# Patient Record
Sex: Female | Born: 1935
Health system: Southern US, Community
[De-identification: ages and names within clinical notes are randomized; demographics above are authoritative.]

## PROBLEM LIST (undated history)

## (undated) DIAGNOSIS — E039 Hypothyroidism, unspecified: Secondary | ICD-10-CM

## (undated) DIAGNOSIS — M5136 Other intervertebral disc degeneration, lumbar region: Secondary | ICD-10-CM

## (undated) DIAGNOSIS — I10 Essential (primary) hypertension: Secondary | ICD-10-CM

## (undated) DIAGNOSIS — K56609 Unspecified intestinal obstruction, unspecified as to partial versus complete obstruction: Secondary | ICD-10-CM

## (undated) DIAGNOSIS — E785 Hyperlipidemia, unspecified: Secondary | ICD-10-CM

## (undated) DIAGNOSIS — H4040X3 Glaucoma secondary to eye inflammation, unspecified eye, severe stage: Secondary | ICD-10-CM

## (undated) HISTORY — DX: Unspecified intestinal obstruction, unspecified as to partial versus complete obstruction: K56.609

## (undated) HISTORY — DX: Hyperlipidemia, unspecified: E78.5

---

## 1997-10-04 ENCOUNTER — Other Ambulatory Visit: Admission: RE | Admit: 1997-10-04 | Discharge: 1997-10-04 | Payer: Self-pay | Admitting: Gynecology

## 1998-10-19 ENCOUNTER — Other Ambulatory Visit: Admission: RE | Admit: 1998-10-19 | Discharge: 1998-10-19 | Payer: Self-pay | Admitting: Gynecology

## 1999-11-27 ENCOUNTER — Other Ambulatory Visit: Admission: RE | Admit: 1999-11-27 | Discharge: 1999-11-27 | Payer: Self-pay | Admitting: Gynecology

## 1999-12-30 ENCOUNTER — Other Ambulatory Visit: Admission: RE | Admit: 1999-12-30 | Discharge: 1999-12-30 | Payer: Self-pay | Admitting: Gynecology

## 2000-12-29 ENCOUNTER — Other Ambulatory Visit: Admission: RE | Admit: 2000-12-29 | Discharge: 2000-12-29 | Payer: Self-pay | Admitting: Gynecology

## 2002-01-11 ENCOUNTER — Other Ambulatory Visit: Admission: RE | Admit: 2002-01-11 | Discharge: 2002-01-11 | Payer: Self-pay | Admitting: Gynecology

## 2002-03-28 ENCOUNTER — Encounter: Payer: Self-pay | Admitting: Gynecology

## 2002-03-28 ENCOUNTER — Ambulatory Visit (HOSPITAL_COMMUNITY): Admission: RE | Admit: 2002-03-28 | Discharge: 2002-03-28 | Payer: Self-pay | Admitting: Gynecology

## 2002-08-26 ENCOUNTER — Emergency Department (HOSPITAL_COMMUNITY): Admission: EM | Admit: 2002-08-26 | Discharge: 2002-08-26 | Payer: Self-pay | Admitting: Emergency Medicine

## 2002-08-26 ENCOUNTER — Encounter: Payer: Self-pay | Admitting: Emergency Medicine

## 2002-11-21 ENCOUNTER — Encounter: Admission: RE | Admit: 2002-11-21 | Discharge: 2002-12-28 | Payer: Self-pay | Admitting: Internal Medicine

## 2003-02-16 ENCOUNTER — Other Ambulatory Visit: Admission: RE | Admit: 2003-02-16 | Discharge: 2003-02-16 | Payer: Self-pay | Admitting: Gynecology

## 2003-03-30 ENCOUNTER — Encounter: Admission: RE | Admit: 2003-03-30 | Discharge: 2003-06-28 | Payer: Self-pay | Admitting: Internal Medicine

## 2003-06-08 ENCOUNTER — Other Ambulatory Visit: Admission: RE | Admit: 2003-06-08 | Discharge: 2003-06-08 | Payer: Self-pay | Admitting: Gynecology

## 2004-03-28 ENCOUNTER — Other Ambulatory Visit: Admission: RE | Admit: 2004-03-28 | Discharge: 2004-03-28 | Payer: Self-pay | Admitting: Gynecology

## 2005-05-06 HISTORY — PX: US ECHOCARDIOGRAPHY: HXRAD669

## 2006-01-27 HISTORY — PX: EYE SURGERY: SHX253

## 2006-12-16 ENCOUNTER — Observation Stay (HOSPITAL_COMMUNITY): Admission: EM | Admit: 2006-12-16 | Discharge: 2006-12-17 | Payer: Self-pay | Admitting: Emergency Medicine

## 2007-01-13 ENCOUNTER — Encounter: Admission: RE | Admit: 2007-01-13 | Discharge: 2007-01-13 | Payer: Self-pay | Admitting: Family Medicine

## 2007-02-17 ENCOUNTER — Encounter: Admission: RE | Admit: 2007-02-17 | Discharge: 2007-04-06 | Payer: Self-pay | Admitting: Family Medicine

## 2009-03-12 ENCOUNTER — Encounter
Admission: RE | Admit: 2009-03-12 | Discharge: 2009-05-22 | Payer: Self-pay | Admitting: Physical Medicine and Rehabilitation

## 2010-06-11 NOTE — Discharge Summary (Signed)
Priscilla Tran, Priscilla Tran                ACCOUNT NO.:  [id number]   MEDICAL RECORD NO.:  [id number]          PATIENT TYPE:  INP   LOCATION:  1405                         FACILITY:  [facility]   PHYSICIAN:  Ladell Pier, M.D.   DATE OF BIRTH:  06-01-1935   DATE OF ADMISSION:  12/16/2006  DATE OF DISCHARGE:  12/17/2006                               DISCHARGE SUMMARY   DISCHARGE DIAGNOSIS:  1. Angioedema secondary to questionable etiology of shrimp versus ACE      inhibitor.  2. Diabetes.  3. Hypertension.  4. Glaucoma.   DISCHARGE MEDICATIONS:  1. Hydrochlorothiazide 25 mg daily.  2. Prednisone taper 10 mg daily x2 days, 2 daily x2 days, then 1 daily      for 2 days, then stop.  3. Claritin 10 mg daily.  4. Metformin ER 1000 mg daily.  5. Effexor 5 mg at bedtime.  6. Etodolac 40 mg twice daily.  7. Atropine eye drops as directed.  8. Flonase nasal spray as directed.  9. Levothyroxine 150 mcg one every other day.  10.Lovastatin 40 mg daily.   FOLLOWUP APPOINTMENTS:  The patient to follow up with primary care  physician in 1 week.   PROCEDURES:  None.   CONSULTANTS:  None.   HISTORY OF PRESENT ILLNESS:  The patient is a 75 year old African-  American female that came in with swelling on her lips, her face, and  her neck.  No difficulty swallowing.  She stated that she had shrimp  earlier that evening and about 3 hours after she noted that her mouth  started swelling.  She also has been taking ACE inhibitor for blood  pressure, but she has been taking that for quite some time.   PAST MEDICAL HISTORY, SOCIAL HISTORY, MEDICATIONS, ALLERGIES, REVIEW OF  SYSTEMS:  Per admission H&P.   PHYSICAL EXAMINATION ON DISCHARGE:  Temperature 97.9, pulse 81,  respirations 23, blood pressure 117/70, pulse oximetry 96% on room air.  CBG 195-209.  HEENT:  Head is normocephalic, atraumatic.  She had  changes on one pupil secondary to glaucoma.  Lungs were clear  bilaterally.  Abdomen:  Positive  bowel sounds.  No edema.  She is able  to talk, phonate, swallow without any problems.  No further swelling in  the neck or lips; that has improved.   HOSPITAL COURSE:  #1.  ANGIOEDEMA:  She was admitted to the hospital, placed on IV  steroid. ACE inhibitor was discontinued. By the next day, her swelling  had resolved.  She will be discharged on prednisone taper.  She will  take Claritin as well daily.   #2.  DIABETES:  Her blood sugar slightly elevated, but she is on the  prednisone taper, so that should improve over the next couple of days.   #3.  HYPERTENSION:  Blood pressure is well controlled without the  lisinopril. Discharge on 25 of hydrochlorothiazide until followup with  her primary care physician for further management.   DISCHARGE LABORATORY DATA:  Hemoglobin A1c 7.0.      Ladell Pier, M.D.  Electronically Signed     NJ/MEDQ  D:  12/17/2006  T:  12/17/2006  Job:  [id number]   cc:   Gabriel Earing, M.D.  Fax: [phone]

## 2010-06-11 NOTE — H&P (Signed)
NAMELAFERN, Priscilla Tran                ACCOUNT NO.:  [id number]   MEDICAL RECORD NO.:  [id number]          PATIENT TYPE:  INP   LOCATION:  1405                         FACILITY:  [facility]   PHYSICIAN:  Della Goo, M.D. DATE OF BIRTH:  01-16-1936   DATE OF ADMISSION:  12/16/2006  DATE OF DISCHARGE:                              HISTORY & PHYSICAL   PRIMARY CARE PHYSICIAN:  Unassigned.   CHIEF COMPLAINT:  Mouth swelling.   HISTORY OF PRESENT ILLNESS:  This is a 75 year old female presenting to  the emergency department, secondary to increased swelling of the lips,  face and tongue since 8:00 p.m. that night.  The patient denies having  any shortness of breath.  She denies having any fevers, chills or any  chest pain.  The patient has had no previous similar episodes to this.   PAST MEDICAL HISTORY:  1. Hypertension.  2. Restless leg syndrome.  3. Type 2 diabetes mellitus.   MEDICATIONS:  1. Lisinopril.  2. Metformin.  3. Flexeril.   ALLERGIES:  No previous allergies, BUT NOW SHE HAS AN ALLERGY TO ACE  INHIBITORS (causing angioedema).   SOCIAL HISTORY:  The patient is a nonsmoker with rare alcohol usage.   FAMILY HISTORY:  Noncontributory.   REVIEW OF SYSTEMS:  Pertinent for mentioned above.   PHYSICAL EXAMINATION:  This is a 75 year old well-nourished, well-  developed female in discomfort but no acute distress currently.  VITAL SIGNS:  Temperature 97.8, blood pressure 172/79, heart rate 80,  respirations 22, O2 saturation 97% on room air.  HEENT:  Normocephalic, atraumatic.  Positive edema of the face and lips.  Pupils are equally round and reactive to light.  Extraocular muscles are  intact.  Funduscopic benign.  Oropharynx is clear.  There is mild tongue  swelling.  There is no airway compromise at this time.  NECK:  Supple.  Full range of motion.  No thyromegaly, adenopathy or  jugular venous distention.  CARDIOVASCULAR:  Regular rate and rhythm.  No murmurs, gallops  or rubs.  LUNGS: Clear to auscultation bilaterally.  ABDOMEN: Positive bowel sounds, soft, nontender, nondistended.  EXTREMITIES:  Without cyanosis, clubbing or edema.   ASSESSMENT:  A 75 year old female being admitted with:  1. Angioedema; most probably secondary to ACE inhibitor therapy.  2. Diabetes type 2.  3. Hypertension.  4. Adverse reaction to medication.   PLAN:  The patient will be admitted to a telemetry area for cardiac  monitoring.  Her O2 saturations will also be monitored.  The patient has  been administered IV Solu-Medrol and Pepcid times one dose.  She will  continue on a steroid taper.  Her regular medications will be verified  and lisinopril therapy will be discontinued.  She has been advised that  she has an adverse reaction to ACE inhibitors.  This will be placed on  her record as well.      Della Goo, M.D.  Electronically Signed     HJ/MEDQ  D:  12/16/2006  T:  12/17/2006  Job:  [id number]

## 2010-06-20 HISTORY — PX: NM MYOCAR PERF WALL MOTION: HXRAD629

## 2010-11-05 LAB — HEMOGLOBIN A1C
Hgb A1c MFr Bld: 7 — ABNORMAL HIGH
Mean Plasma Glucose: 172

## 2011-03-30 ENCOUNTER — Ambulatory Visit (INDEPENDENT_AMBULATORY_CARE_PROVIDER_SITE_OTHER): Payer: PRIVATE HEALTH INSURANCE | Admitting: Family Medicine

## 2011-03-30 ENCOUNTER — Ambulatory Visit: Payer: Medicare Other

## 2011-03-30 DIAGNOSIS — E119 Type 2 diabetes mellitus without complications: Secondary | ICD-10-CM

## 2011-03-30 DIAGNOSIS — R1013 Epigastric pain: Secondary | ICD-10-CM

## 2011-03-30 DIAGNOSIS — R1012 Left upper quadrant pain: Secondary | ICD-10-CM

## 2011-03-30 DIAGNOSIS — R1110 Vomiting, unspecified: Secondary | ICD-10-CM

## 2011-03-30 DIAGNOSIS — R1011 Right upper quadrant pain: Secondary | ICD-10-CM

## 2011-03-30 LAB — POCT URINALYSIS DIPSTICK
Bilirubin, UA: NEGATIVE
Glucose, UA: NEGATIVE
Spec Grav, UA: 1.025
Urobilinogen, UA: 0.2

## 2011-03-30 LAB — POCT CBC
Granulocyte percent: 84 %G — AB (ref 37–80)
HCT, POC: 40.5 % (ref 37.7–47.9)
Hemoglobin: 13 g/dL (ref 12.2–16.2)
MCV: 82.3 fL (ref 80–97)
MID (cbc): 0.4 (ref 0–0.9)
Platelet Count, POC: 253 10*3/uL (ref 142–424)
RBC: 4.92 M/uL (ref 4.04–5.48)
WBC: 10.6 10*3/uL — AB (ref 4.6–10.2)

## 2011-03-30 LAB — GLUCOSE, POCT (MANUAL RESULT ENTRY): POC Glucose: 172

## 2011-03-30 MED ORDER — ONDANSETRON 4 MG PO TBDP: 4.0000 mg | ORAL_TABLET | Freq: Three times a day (TID) | ORAL | Status: AC | PRN

## 2011-03-30 MED ORDER — ONDANSETRON HCL 4 MG PO TABS
4.0000 mg | ORAL_TABLET | Freq: Once | ORAL | Status: AC
  Administered 2011-03-30: 4 mg via ORAL

## 2011-03-30 NOTE — Patient Instructions (Signed)
Fluids only tonight, zofran up to every 8 hours as needed.  If pain not improving, be evaluated in the emergency room.    Return to the clinic or go to the nearest emergency room if any of your symptoms worsen or new symptoms occur.

## 2011-03-30 NOTE — Progress Notes (Signed)
Subjective:    Patient ID: Priscilla Tran, female    DOB: 1935-10-03, 76 y.o.   MRN: [id number]  HPI Priscilla Tran is a 76 y.o. female Prior patient of Dr. Providence Lanius, history of diabetes, htn, high cholesterol, glaucoma C/o upper abdominal pain 3 hours after breakfast  (about 11 am.), vomited around noon.  4 episodes of vomiting since noon.  Tried ginger ale, and water - both came back up.  Nausea persisted.  No chest pain, but sore in upper abdomen on and off all afternoon.  No diarrhea. Last bm this am - normal stool x2. No abdominal surgeries. No known sick contacts. Tx: nothing.  Review of Systems  Constitutional: Positive for fatigue. Negative for fever, chills and diaphoresis.  HENT: Negative for trouble swallowing.   Respiratory: Negative for cough, choking and shortness of breath.   Cardiovascular: Negative for chest pain, palpitations and leg swelling.  Gastrointestinal: Positive for nausea, vomiting and abdominal pain. Negative for diarrhea, constipation, blood in stool and anal bleeding.       Feels swollen upper abdomen  Genitourinary: Positive for frequency. Negative for dysuria and difficulty urinating.       Chronic frequency.  Straining with urination at times.  Musculoskeletal: Negative for myalgias and arthralgias.  Skin: Negative for rash.  Neurological: Positive for dizziness, weakness and light-headedness. Negative for speech difficulty, numbness and headaches.       Generally feels weak.  No slurred speech or focal weakness.       Objective:   Physical Exam  Constitutional: She is oriented to person, place, and time. She appears well-developed and well-nourished. No distress.  HENT:  Head: Normocephalic and atraumatic.  Eyes: Conjunctivae and EOM are normal. Pupils are equal, round, and reactive to light.  Neck: Normal range of motion.  Cardiovascular: Normal rate, regular rhythm, normal heart sounds and intact distal pulses.   No murmur  heard. Pulmonary/Chest: Effort normal and breath sounds normal.  Abdominal: Normal appearance. She exhibits no distension. Bowel sounds are increased. There is tenderness in the right upper quadrant, epigastric area and left upper quadrant. There is no rigidity, no rebound, no guarding, no CVA tenderness, no tenderness at McBurney's point and negative Murphy's sign.  Neurological: She is alert and oriented to person, place, and time.  Skin: Skin is warm and dry. She is not diaphoretic.  Psychiatric: She has a normal mood and affect. Her behavior is normal.   EKG: sr, left axis deviation, no prior available for review. [facility] reading (PRIMARY) by  Dr. Neva Seat: abd series: gas noted in bowel , but no apparent air/fluid levels, or acute findings.  Results for orders placed in visit on 03/30/11  GLUCOSE, POCT (MANUAL RESULT ENTRY)      Component Value Range   POC Glucose 172    POCT CBC      Component Value Range   WBC 10.6 (*) 4.6 - 10.2 (K/uL)   Lymph, poc 1.3  0.6 - 3.4    POC LYMPH PERCENT 12.2  10 - 50 (%L)   MID (cbc) 0.4  0 - 0.9    POC MID % 3.8  0 - 12 (%M)   POC Granulocyte 8.9 (*) 2 - 6.9    Granulocyte percent 84.0 (*) 37 - 80 (%G)   RBC 4.92  4.04 - 5.48 (M/uL)   Hemoglobin 13.0  12.2 - 16.2 (g/dL)   HCT, POC 21.3  08.6 - 47.9 (%)   MCV 82.3  80 - 97 (fL)  MCH, POC 26.4 (*) 27 - 31.2 (pg)   MCHC 32.1  31.8 - 35.4 (g/dL)   RDW, POC 24.4     Platelet Count, POC 253  142 - 424 (K/uL)   MPV 8.8  0 - 99.8 (fL)  POCT URINALYSIS DIPSTICK      Component Value Range   Color, UA yellow     Clarity, UA clear     Glucose, UA neg     Bilirubin, UA neg     Ketones, UA 40     Spec Grav, UA 1.025     Blood, UA trace     pH, UA 7.0     Protein, UA 30     Urobilinogen, UA 0.2     Nitrite, UA neg     Leukocytes, UA Negative          Assessment & Plan:  Priscilla Tran is a 77 y.o. female 1. Diabetes mellitus  POCT glucose (manual entry), POCT urinalysis dipstick, EKG 12-Lead   2. Abdominal pain, epigastric  POCT urinalysis dipstick, EKG 12-Lead, DG Abd Acute W/Chest  3. Abdominal pain, LUQ  POCT CBC, EKG 12-Lead, DG Abd Acute W/Chest  4. Abdominal pain, RUQ  POCT glucose (manual entry), POCT CBC, EKG 12-Lead, DG Abd Acute W/Chest  5. Vomiting  EKG 12-Lead, DG Abd Acute W/Chest, ondansetron (ZOFRAN) tablet 4 mg   Nausea/vomiting with camping abd pain, and reported distension/bloating.  Reassuring abd series, borderline leukocytosis. Suspected gastroenteritis. Discussed options, including ER eval now.  Pt plans on going home now, fluids only and Zofran, and if not improving next few hours, will be evaluated at the emergency room.  911 precautions reviewed.  Pt to have recheck u/a next few weeks for hematuria, sooner if any new symptoms.

## 2011-03-31 ENCOUNTER — Encounter (HOSPITAL_COMMUNITY): Payer: Self-pay | Admitting: *Deleted

## 2011-03-31 ENCOUNTER — Emergency Department (HOSPITAL_COMMUNITY): Payer: Medicare Other

## 2011-03-31 ENCOUNTER — Other Ambulatory Visit: Payer: Self-pay

## 2011-03-31 ENCOUNTER — Inpatient Hospital Stay (HOSPITAL_COMMUNITY)
Admission: EM | Admit: 2011-03-31 | Discharge: 2011-04-09 | DRG: 337 | Disposition: A | Discharge status: Home / Self Care | Payer: Medicare Other | Attending: Surgery | Admitting: Surgery

## 2011-03-31 DIAGNOSIS — K565 Intestinal adhesions [bands], unspecified as to partial versus complete obstruction: Principal | ICD-10-CM | POA: Diagnosis present

## 2011-03-31 DIAGNOSIS — K56609 Unspecified intestinal obstruction, unspecified as to partial versus complete obstruction: Secondary | ICD-10-CM

## 2011-03-31 DIAGNOSIS — E876 Hypokalemia: Secondary | ICD-10-CM | POA: Diagnosis not present

## 2011-03-31 DIAGNOSIS — R739 Hyperglycemia, unspecified: Secondary | ICD-10-CM

## 2011-03-31 DIAGNOSIS — M51379 Other intervertebral disc degeneration, lumbosacral region without mention of lumbar back pain or lower extremity pain: Secondary | ICD-10-CM | POA: Diagnosis present

## 2011-03-31 DIAGNOSIS — H4040X0 Glaucoma secondary to eye inflammation, unspecified eye, stage unspecified: Secondary | ICD-10-CM | POA: Diagnosis present

## 2011-03-31 DIAGNOSIS — E039 Hypothyroidism, unspecified: Secondary | ICD-10-CM | POA: Insufficient documentation

## 2011-03-31 DIAGNOSIS — E669 Obesity, unspecified: Secondary | ICD-10-CM | POA: Diagnosis present

## 2011-03-31 DIAGNOSIS — H5440 Blindness, one eye, unspecified eye: Secondary | ICD-10-CM | POA: Diagnosis present

## 2011-03-31 DIAGNOSIS — R1110 Vomiting, unspecified: Secondary | ICD-10-CM

## 2011-03-31 DIAGNOSIS — H4040X3 Glaucoma secondary to eye inflammation, unspecified eye, severe stage: Secondary | ICD-10-CM | POA: Insufficient documentation

## 2011-03-31 DIAGNOSIS — M5137 Other intervertebral disc degeneration, lumbosacral region: Secondary | ICD-10-CM | POA: Diagnosis present

## 2011-03-31 DIAGNOSIS — I10 Essential (primary) hypertension: Secondary | ICD-10-CM | POA: Insufficient documentation

## 2011-03-31 DIAGNOSIS — E119 Type 2 diabetes mellitus without complications: Secondary | ICD-10-CM | POA: Diagnosis present

## 2011-03-31 DIAGNOSIS — H409 Unspecified glaucoma: Secondary | ICD-10-CM | POA: Diagnosis present

## 2011-03-31 DIAGNOSIS — R112 Nausea with vomiting, unspecified: Secondary | ICD-10-CM | POA: Diagnosis present

## 2011-03-31 DIAGNOSIS — M5136 Other intervertebral disc degeneration, lumbar region: Secondary | ICD-10-CM

## 2011-03-31 HISTORY — DX: Hypothyroidism, unspecified: E03.9

## 2011-03-31 HISTORY — DX: Essential (primary) hypertension: I10

## 2011-03-31 HISTORY — DX: Other intervertebral disc degeneration, lumbar region: M51.36

## 2011-03-31 HISTORY — DX: Unspecified intestinal obstruction, unspecified as to partial versus complete obstruction: K56.609

## 2011-03-31 HISTORY — DX: Glaucoma secondary to eye inflammation, unspecified eye, severe stage: H40.40X3

## 2011-03-31 LAB — COMPREHENSIVE METABOLIC PANEL
ALT: 13 U/L (ref 0–35)
Alkaline Phosphatase: 69 U/L (ref 39–117)
GFR calc Af Amer: >90 mL/min (ref >90)
Glucose, Bld: 189 mg/dL — ABNORMAL HIGH (ref 70–99)
Potassium: 3.6 mEq/L (ref 3.5–5.1)
Sodium: 129 mEq/L — ABNORMAL LOW (ref 135–145)
Total Protein: 7.4 g/dL (ref 6.0–8.3)

## 2011-03-31 LAB — CBC
Platelets: 253 10*3/uL (ref 150–400)
RBC: 5 MIL/uL (ref 3.87–5.11)
WBC: 12.2 10*3/uL — ABNORMAL HIGH (ref 4.0–10.5)

## 2011-03-31 LAB — URINALYSIS, ROUTINE W REFLEX MICROSCOPIC
Bilirubin Urine: NEGATIVE
Specific Gravity, Urine: 1.025 (ref 1.005–1.030)
Urobilinogen, UA: 0.2 mg/dL (ref 0.0–1.0)
pH: 6 (ref 5.0–8.0)

## 2011-03-31 LAB — CARDIAC PANEL(CRET KIN+CKTOT+MB+TROPI)
CK, MB: 4.3 ng/mL — ABNORMAL HIGH (ref 0.3–4.0)
Relative Index: 3 — ABNORMAL HIGH (ref 0.0–2.5)
Troponin I: <0.3 ng/mL (ref <0.30)

## 2011-03-31 LAB — DIFFERENTIAL
Eosinophils Absolute: 0 10*3/uL (ref 0.0–0.7)
Lymphocytes Relative: 10 % — ABNORMAL LOW (ref 12–46)
Lymphs Abs: 1.3 10*3/uL (ref 0.7–4.0)
Neutro Abs: 10.6 10*3/uL — ABNORMAL HIGH (ref 1.7–7.7)
Neutrophils Relative %: 86 % — ABNORMAL HIGH (ref 43–77)

## 2011-03-31 LAB — GLUCOSE, CAPILLARY: Glucose-Capillary: 147 mg/dL — ABNORMAL HIGH (ref 70–99)

## 2011-03-31 LAB — URINE MICROSCOPIC-ADD ON

## 2011-03-31 LAB — LACTIC ACID, PLASMA: Lactic Acid, Venous: 1.5 mmol/L (ref 0.5–2.2)

## 2011-03-31 MED ORDER — BRIMONIDINE TARTRATE 0.2 % OP SOLN
1.0000 [drp] | Freq: Every day | OPHTHALMIC | Status: DC
  Administered 2011-04-01 – 2011-04-08 (×8): 1 [drp] via OPHTHALMIC
  Filled 2011-03-31: qty 5

## 2011-03-31 MED ORDER — ACETAMINOPHEN 325 MG PO TABS
650.0000 mg | ORAL_TABLET | Freq: Four times a day (QID) | ORAL | Status: DC | PRN
  Administered 2011-04-03: 650 mg via ORAL
  Filled 2011-03-31: qty 2

## 2011-03-31 MED ORDER — ONDANSETRON HCL 4 MG/2ML IJ SOLN
4.0000 mg | Freq: Four times a day (QID) | INTRAMUSCULAR | Status: DC | PRN
  Administered 2011-03-31 – 2011-04-05 (×9): 4 mg via INTRAVENOUS
  Filled 2011-03-31 (×9): qty 2

## 2011-03-31 MED ORDER — DIPHENHYDRAMINE HCL 12.5 MG/5ML PO ELIX: 12.5000 mg | ORAL_SOLUTION | Freq: Four times a day (QID) | ORAL | Status: DC | PRN

## 2011-03-31 MED ORDER — IOHEXOL 300 MG/ML  SOLN
80.0000 mL | Freq: Once | INTRAMUSCULAR | Status: AC | PRN
  Administered 2011-03-31: 80 mL via INTRAVENOUS

## 2011-03-31 MED ORDER — MORPHINE SULFATE 4 MG/ML IJ SOLN
4.0000 mg | Freq: Once | INTRAMUSCULAR | Status: AC
  Administered 2011-03-31: 4 mg via INTRAVENOUS
  Filled 2011-03-31: qty 1

## 2011-03-31 MED ORDER — HEPARIN SODIUM (PORCINE) 5000 UNIT/ML IJ SOLN
5000.0000 [IU] | Freq: Three times a day (TID) | INTRAMUSCULAR | Status: DC
  Administered 2011-03-31 – 2011-04-09 (×25): 5000 [IU] via SUBCUTANEOUS
  Filled 2011-03-31 (×30): qty 1

## 2011-03-31 MED ORDER — SODIUM CHLORIDE 0.9 % IV BOLUS (SEPSIS)
1000.0000 mL | Freq: Once | INTRAVENOUS | Status: AC
  Administered 2011-03-31: 1000 mL via INTRAVENOUS

## 2011-03-31 MED ORDER — ONDANSETRON HCL 4 MG/2ML IJ SOLN
2.0000 mg | Freq: Once | INTRAMUSCULAR | Status: AC
  Administered 2011-03-31: 2 mg via INTRAVENOUS
  Filled 2011-03-31: qty 2

## 2011-03-31 MED ORDER — DIPHENHYDRAMINE HCL 50 MG/ML IJ SOLN: 12.5000 mg | Freq: Four times a day (QID) | INTRAMUSCULAR | Status: DC | PRN

## 2011-03-31 MED ORDER — ACETAMINOPHEN 650 MG RE SUPP: 650.0000 mg | Freq: Four times a day (QID) | RECTAL | Status: DC | PRN

## 2011-03-31 MED ORDER — PANTOPRAZOLE SODIUM 40 MG IV SOLR
40.0000 mg | Freq: Every day | INTRAVENOUS | Status: DC
  Administered 2011-03-31 – 2011-04-08 (×9): 40 mg via INTRAVENOUS
  Filled 2011-03-31 (×10): qty 40

## 2011-03-31 MED ORDER — DORZOLAMIDE HCL-TIMOLOL MAL 2-0.5 % OP SOLN
1.0000 [drp] | Freq: Two times a day (BID) | OPHTHALMIC | Status: DC
  Administered 2011-03-31 – 2011-04-09 (×17): 1 [drp] via OPHTHALMIC
  Filled 2011-03-31 (×2): qty 10

## 2011-03-31 MED ORDER — METOPROLOL TARTRATE 1 MG/ML IV SOLN
5.0000 mg | Freq: Four times a day (QID) | INTRAVENOUS | Status: DC
  Administered 2011-03-31 – 2011-04-02 (×8): 5 mg via INTRAVENOUS
  Filled 2011-03-31 (×16): qty 5

## 2011-03-31 MED ORDER — POTASSIUM CHLORIDE IN NACL 20-0.9 MEQ/L-% IV SOLN
INTRAVENOUS | Status: DC
  Administered 2011-03-31: 19:00:00 via INTRAVENOUS
  Administered 2011-04-01 (×2): 125 mL/h via INTRAVENOUS
  Administered 2011-04-02 – 2011-04-04 (×5): via INTRAVENOUS
  Filled 2011-03-31 (×12): qty 1000

## 2011-03-31 MED ORDER — BIMATOPROST 0.01 % OP SOLN
1.0000 [drp] | Freq: Every day | OPHTHALMIC | Status: DC
  Administered 2011-03-31 – 2011-04-08 (×8): 1 [drp] via OPHTHALMIC
  Filled 2011-03-31 (×2): qty 2.5

## 2011-03-31 MED ORDER — LEVOTHYROXINE SODIUM 100 MCG IV SOLR
75.0000 ug | Freq: Every day | INTRAVENOUS | Status: DC
  Administered 2011-03-31 – 2011-04-07 (×8): 76 ug via INTRAVENOUS
  Filled 2011-03-31 (×9): qty 3.8

## 2011-03-31 MED ORDER — HYDROMORPHONE HCL PF 1 MG/ML IJ SOLN
0.5000 mg | INTRAMUSCULAR | Status: DC | PRN
  Administered 2011-03-31 – 2011-04-04 (×14): 1 mg via INTRAVENOUS
  Filled 2011-03-31 (×16): qty 1

## 2011-03-31 NOTE — ED Notes (Signed)
Report called to [employer] on 5E.

## 2011-03-31 NOTE — ED Notes (Signed)
Pt was put into a gown,on monitor

## 2011-03-31 NOTE — ED Notes (Signed)
Transporter called to transport pt

## 2011-03-31 NOTE — ED Notes (Signed)
Pt presenting to ed with c/o abdominal pain with positive nausea and vomiting. Pt states she was seen yesterday and could not afford her zofran and she's not feeling any better. Pt is alert and oriented at this time. Pt with family at bedside

## 2011-03-31 NOTE — ED Notes (Signed)
Surgery pa at bedside

## 2011-03-31 NOTE — ED Provider Notes (Signed)
History     CSN: [id number]  Arrival date & time 03/31/11  0711   First MD Initiated Contact with Patient 03/31/11 [phone]      Chief Complaint  Patient presents with  . Abdominal Pain  . Nausea  . Emesis  . Weakness   76 year old female with known history of diabetes. Began having abdominal pain and vomiting on Sunday. She was seen at the urgent care yesterday and had abdominal x-rays done, which were negative. She was given Zofran at that time, as well as a Zofran prescription, which she was not able to fill. She states the pain has persisted diffusely through the bilateral epigastric area. She's had continued intermittent nausea and vomiting. The pain is described as soreness, which is nonradiating. Bilateral across the upper portions of the abdomen. She's had no dysuria, no fever. No diarrhea, but has had some intermittent constipation. Pain is currently 6/10 in intensity. She also complains of generalized weakness and fatigue. She states her blood sugars have been running in the range of 120 at home. She's had no syncope (Consider location/radiation/quality/duration/timing/severity/associated sxs/prior treatment) HPI  Past Medical History  Diagnosis Date  . Hypertension   . Diabetes mellitus     Past Surgical History  Procedure Date  . Eye surgery     No family history on file.  History  Substance Use Topics  . Smoking status: Never Smoker   . Smokeless tobacco: Not on file  . Alcohol Use: Yes     ocacsionally    OB History    Grav Para Term Preterm Abortions TAB SAB Ect Mult Living                  Review of Systems  All other systems reviewed and are negative.    Allergies  Lisinopril  Home Medications   Current Outpatient Rx  Name Route Sig Dispense Refill  . ACETAMINOPHEN 325 MG PO TABS Oral Take 650 mg by mouth every 6 (six) hours as needed. For pain    . AMLODIPINE BESY-BENAZEPRIL HCL 10-20 MG PO CAPS Oral Take 1 capsule by mouth daily.    . ASPIRIN  EC 81 MG PO TBEC Oral Take 81 mg by mouth daily.    Marland Kitchen BIMATOPROST 0.03 % OP SOLN Left Eye Place 1 drop into the left eye at bedtime.    Marland Kitchen BISMUTH SUBSALICYLATE 262 MG/15ML PO SUSP Oral Take 15 mLs by mouth every 6 (six) hours as needed. For upset stomach    . BRIMONIDINE TARTRATE 0.2 % OP SOLN Left Eye Place 1 drop into the left eye 2 (two) times daily.    Marland Kitchen CALCIUM CARBONATE 600 MG PO TABS Oral Take 600 mg by mouth daily.    Marland Kitchen CARVEDILOL 12.5 MG PO TABS Oral Take 12.5 mg by mouth 2 (two) times daily with a meal.    . DORZOLAMIDE HCL-TIMOLOL MAL 22.3-6.8 MG/ML OP SOLN Both Eyes Place 1 drop into both eyes 2 (two) times daily.    Marland Kitchen LEVOTHYROXINE SODIUM 150 MCG PO TABS Oral Take 150 mcg by mouth daily.    Marland Kitchen LOVASTATIN 10 MG PO TABS Oral Take 10 mg by mouth at bedtime.    Marland Kitchen METFORMIN HCL 1000 MG PO TABS Oral Take 1,000 mg by mouth 2 (two) times daily with a meal.    . ONDANSETRON 4 MG PO TBDP Oral Take 1 tablet (4 mg total) by mouth every 8 (eight) hours as needed for nausea. 10 tablet 0  BP 177/83  Pulse 66  Temp(Src) 98.7 F (37.1 C) (Oral)  Resp 18  SpO2 97%  Physical Exam  Nursing note and vitals reviewed. Constitutional: She is oriented to person, place, and time. She appears well-developed and well-nourished.  HENT:  Head: Normocephalic and atraumatic.  Mouth/Throat: Oropharynx is clear and moist.  Eyes: Conjunctivae and EOM are normal. Pupils are equal, round, and reactive to light.       Left cornea chronically opacified  Neck: Neck supple.  Cardiovascular: Normal rate and regular rhythm.  Exam reveals no gallop and no friction rub.   No murmur heard. Pulmonary/Chest: Breath sounds normal. She has no wheezes. She has no rales. She exhibits no tenderness.  Abdominal: Soft. Bowel sounds are normal. She exhibits no distension. There is tenderness. There is no rebound and no guarding.       Diffuse bilateral upper quadrant tenderness and epigastric tenderness to palpation.  Negative Murphy sign. No rebound or guarding.  Musculoskeletal: Normal range of motion.  Neurological: She is alert and oriented to person, place, and time. No cranial nerve deficit. Coordination normal.  Skin: Skin is warm and dry. No rash noted.  Psychiatric: She has a normal mood and affect.    ED Course  Procedures (including critical care time)  Labs Reviewed  GLUCOSE, CAPILLARY - Abnormal; Notable for the following:    Glucose-Capillary 192 (*)    All other components within normal limits  CARDIAC PANEL(CRET KIN+CKTOT+MB+TROPI) - Abnormal; Notable for the following:    CK, MB 4.3 (*)    Relative Index 3.0 (*)    All other components within normal limits  CBC - Abnormal; Notable for the following:    WBC 12.2 (*)    RDW 16.1 (*)    All other components within normal limits  DIFFERENTIAL - Abnormal; Notable for the following:    Neutrophils Relative 86 (*)    Neutro Abs 10.6 (*)    Lymphocytes Relative 10 (*)    All other components within normal limits  COMPREHENSIVE METABOLIC PANEL - Abnormal; Notable for the following:    Sodium 129 (*)    Chloride 90 (*)    Glucose, Bld 189 (*)    All other components within normal limits  URINALYSIS, ROUTINE W REFLEX MICROSCOPIC - Abnormal; Notable for the following:    APPearance CLOUDY (*)    Glucose, UA 250 (*)    Hgb urine dipstick SMALL (*)    Ketones, ur 15 (*)    Protein, ur 30 (*)    All other components within normal limits  URINE MICROSCOPIC-ADD ON - Abnormal; Notable for the following:    Squamous Epithelial / LPF MANY (*)    Bacteria, UA MANY (*)    All other components within normal limits  LIPASE, BLOOD   Dg Abd Acute W/chest  03/30/2011  [facility] *RADIOLOGY REPORT*  Clinical Data: Abdominal pain with nausea, vomiting.  No diarrhea.  ACUTE ABDOMEN SERIES (ABDOMEN 2 VIEW & CHEST 1 VIEW)  Comparison: None.  Findings: The heart size and mediastinal contours are normal.  Mild central airway  thickening is noted in the lungs.  There is no confluent airspace opacity or pleural effusion.  The bowel gas pattern is normal.  There is fluid within the stomach.  No free intraperitoneal air or suspicious calcification is identified.  There are mild degenerative changes of the lower lumbar spine and the symphysis pubis.  IMPRESSION: No active cardiopulmonary or abdominal process demonstrated.  Original  Report Authenticated By: Gerrianne Scale, M.D.     1. Abdominal pain   2. Hyperglycemia       MDM  Pt is seen and examined;  Initial history and physical completed.  Will follow.      Results for orders placed during the hospital encounter of 03/31/11  GLUCOSE, CAPILLARY      Component Value Range   Glucose-Capillary 192 (*) 70 - 99 (mg/dL)  CARDIAC PANEL(CRET KIN+CKTOT+MB+TROPI)      Component Value Range   Total CK 143  7 - 177 (U/L)   CK, MB 4.3 (*) 0.3 - 4.0 (ng/mL)   Troponin I <0.30  <0.30 (ng/mL)   Relative Index 3.0 (*) 0.0 - 2.5   CBC      Component Value Range   WBC 12.2 (*) 4.0 - 10.5 (K/uL)   RBC 5.00  3.87 - 5.11 (MIL/uL)   Hemoglobin 13.6  12.0 - 15.0 (g/dL)   HCT 78.2  95.6 - 21.3 (%)   MCV 79.0  78.0 - 100.0 (fL)   MCH 27.2  26.0 - 34.0 (pg)   MCHC 34.4  30.0 - 36.0 (g/dL)   RDW 08.6 (*) 57.8 - 15.5 (%)   Platelets 253  150 - 400 (K/uL)  DIFFERENTIAL      Component Value Range   Neutrophils Relative 86 (*) 43 - 77 (%)   Neutro Abs 10.6 (*) 1.7 - 7.7 (K/uL)   Lymphocytes Relative 10 (*) 12 - 46 (%)   Lymphs Abs 1.3  0.7 - 4.0 (K/uL)   Monocytes Relative 3  3 - 12 (%)   Monocytes Absolute 0.4  0.1 - 1.0 (K/uL)   Eosinophils Relative 0  0 - 5 (%)   Eosinophils Absolute 0.0  0.0 - 0.7 (K/uL)   Basophils Relative 0  0 - 1 (%)   Basophils Absolute 0.0  0.0 - 0.1 (K/uL)  COMPREHENSIVE METABOLIC PANEL      Component Value Range   Sodium 129 (*) 135 - 145 (mEq/L)   Potassium 3.6  3.5 - 5.1 (mEq/L)   Chloride 90 (*) 96 - 112 (mEq/L)   CO2 28  19 - 32  (mEq/L)   Glucose, Bld 189 (*) 70 - 99 (mg/dL)   BUN 11  6 - 23 (mg/dL)   Creatinine, Ser 4.69  0.50 - 1.10 (mg/dL)   Calcium 9.9  8.4 - 62.9 (mg/dL)   Total Protein 7.4  6.0 - 8.3 (g/dL)   Albumin 4.0  3.5 - 5.2 (g/dL)   AST 18  0 - 37 (U/L)   ALT 13  0 - 35 (U/L)   Alkaline Phosphatase 69  39 - 117 (U/L)   Total Bilirubin 0.9  0.3 - 1.2 (mg/dL)   GFR calc non Af Amer >90  >90 (mL/min)   GFR calc Af Amer >90  >90 (mL/min)  LIPASE, BLOOD      Component Value Range   Lipase 26  11 - 59 (U/L)  URINALYSIS, ROUTINE W REFLEX MICROSCOPIC      Component Value Range   Color, Urine YELLOW  YELLOW    APPearance CLOUDY (*) CLEAR    Specific Gravity, Urine 1.025  1.005 - 1.030    pH 6.0  5.0 - 8.0    Glucose, UA 250 (*) NEGATIVE (mg/dL)   Hgb urine dipstick SMALL (*) NEGATIVE    Bilirubin Urine NEGATIVE  NEGATIVE    Ketones, ur 15 (*) NEGATIVE (mg/dL)   Protein, ur 30 (*) NEGATIVE (  mg/dL)   Urobilinogen, UA 0.2  0.0 - 1.0 (mg/dL)   Nitrite NEGATIVE  NEGATIVE    Leukocytes, UA NEGATIVE  NEGATIVE   URINE MICROSCOPIC-ADD ON      Component Value Range   Squamous Epithelial / LPF MANY (*) RARE    WBC, UA 3-6  <3 (WBC/hpf)   RBC / HPF 3-6  <3 (RBC/hpf)   Bacteria, UA MANY (*) RARE    Urine-Other MUCOUS PRESENT     Dg Abd Acute W/chest  03/30/2011  [facility] *RADIOLOGY REPORT*  Clinical Data: Abdominal pain with nausea, vomiting.  No diarrhea.  ACUTE ABDOMEN SERIES (ABDOMEN 2 VIEW & CHEST 1 VIEW)  Comparison: None.  Findings: The heart size and mediastinal contours are normal.  Mild central airway thickening is noted in the lungs.  There is no confluent airspace opacity or pleural effusion.  The bowel gas pattern is normal.  There is fluid within the stomach.  No free intraperitoneal air or suspicious calcification is identified.  There are mild degenerative changes of the lower lumbar spine and the symphysis pubis.  IMPRESSION: No active cardiopulmonary or abdominal process  demonstrated.  Original Report Authenticated By: Gerrianne Scale, M.D.     Date: 03/31/2011  Rate: 60  Rhythm: normal sinus rhythm  QRS Axis: normal  Intervals: normal  ST/T Wave abnormalities: nonspecific ST/T changes  Conduction Disutrbances:left anterior fascicular block  Narrative Interpretation:   Old EKG Reviewed: changes noted     LFT's normal, Trop neg, urine w/ many squamous epith. Cells, neg nitrites and neg leukocytes, lipase nl.     11:00 AM  CT scan results were discussed with the radiologist and documented below. Also, reviewed by myself. Showing hernia, obstruction pattern, and possibly early bowel ischemia.  As, such we'll contact the general surgeon, and obtain a lactic acid. Results will be conveyed to the family. Will continue to follow closely and provide hydration and good supportive care.   Liver, gallbladder, adrenal glands, kidneys, spleen, pancreas, stomach and proximal small bowel are unremarkable. There are dilated loops of distal small bowel in the right lower quadrant, seen anterior to the colon. Mild associated mesenteric edema with small perihepatic ascites. Colon appendix are unremarkable.  Uterus and ovaries are visualized. Trace dependent pelvic free fluid. Scattered atherosclerotic calcification of the arterial vasculature without abdominal aortic aneurysm. No pathologically enlarged lymph nodes.  IMPRESSION:  1. Obstructed distal small bowel, possibly due to an internal hernia. Associated mild mesenteric edema can be seen with early ischemia. Critical Value/emergent results were called by telephone at the time of interpretation on 03/31/2011 at 1100 hours to Dr. 02/06, who verbally acknowledged these results. 2. Small perihepatic ascites. Trace pelvic free fluid.  Original Report Authenticated By: Reyes Ivan, M.D.  11:12 AM  Discussed with Zola Button, PA from Surgery.  States will be down right away to see Pt.  Dr Michaell Cowing  in OR at this time.         Tenita Cue A. Patrica Duel, MD 03/31/11 1112

## 2011-03-31 NOTE — H&P (Signed)
Priscilla Tran is an 76 y.o. female.   Chief Complaint: Abdominal pain nausea and vomiting. Primary care: Dr. Langley Adie but she is changing to an [facility] family [facility]. Cardiology: Dr. Nanetta Batty HPI: Patient is a 76 year old female who was doing well until Saturday evening 03/29/11. She came home from church and noticed her stomach was bloated and thought it was gas. Sunday sometime after lunch she developed abdominal pain more not in vomiting. She presented to primary care where a three-way film was unremarkable. White count was 10,600 and was her opinion she probably had gastroenteritis. She had some ongoing abdominal discomfort nausea and vomiting since discharge.  She is back with new CT findings listed below.  ? Of internal hernia,  And ischemia.  NG placed with 600 ml clear what looks like contrast media.    Past Medical History  Diagnosis Date  . Hypertension   . Diabetes mellitus   . Hypothyroid   . Glaucoma associated with ocular inflammation, severe stage     blind Right eye  . Disc degeneration, lumbar     Dr. Regino Schultze    Past Surgical History  Procedure Date  . Eye surgery     No family history on file. Social History:  reports that she has never smoked. She does not have any smokeless tobacco history on file. She reports that she drinks alcohol. She reports that she does not use illicit drugs.  Allergies:  Allergies  Allergen Reactions  . Lisinopril Swelling    Medications Prior to Admission  Medication Dose Route Frequency Provider Last Rate Last Dose  . iohexol (OMNIPAQUE) 300 MG/ML solution 80 mL  80 mL Intravenous Once PRN Medication Radiologist, MD   80 mL at 03/31/11 1030  . morphine 4 MG/ML injection 4 mg  4 mg Intravenous Once Peter A. Patrica Duel, MD   4 mg at 03/31/11 0813  . ondansetron (ZOFRAN) injection 2 mg  2 mg Intravenous Once Peter A. Patrica Duel, MD   2 mg at 03/31/11 0813  . ondansetron (ZOFRAN) tablet 4 mg  4 mg Oral Once Shade Flood, MD   4 mg at  03/30/11 2034  . sodium chloride 0.9 % bolus 1,000 mL  1,000 mL Intravenous Once Peter A. Patrica Duel, MD   1,000 mL at 03/31/11 1109   Medications Prior to Admission  Medication Sig Dispense Refill  . amLODipine-benazepril (LOTREL) 10-20 MG per capsule Take 1 capsule by mouth daily.      . carvedilol (COREG) 12.5 MG tablet Take 12.5 mg by mouth 2 (two) times daily with a meal.      . lovastatin (MEVACOR) 10 MG tablet Take 10 mg by mouth at bedtime.      . metFORMIN (GLUCOPHAGE) 1000 MG tablet Take 1,000 mg by mouth 2 (two) times daily with a meal.      . ondansetron (ZOFRAN ODT) 4 MG disintegrating tablet Take 1 tablet (4 mg total) by mouth every 8 (eight) hours as needed for nausea.  10 tablet  0    Results for orders placed during the hospital encounter of 03/31/11 (from the past 48 hour(s))  GLUCOSE, CAPILLARY     Status: Abnormal   Collection Time   03/31/11  7:24 AM      Component Value Range Comment   Glucose-Capillary 192 (*) 70 - 99 (mg/dL)   CARDIAC PANEL(CRET KIN+CKTOT+MB+TROPI)     Status: Abnormal   Collection Time   03/31/11  7:45 AM      Component  Value Range Comment   Total CK 143  7 - 177 (U/L)    CK, MB 4.3 (*) 0.3 - 4.0 (ng/mL)    Troponin I <0.30  <0.30 (ng/mL)    Relative Index 3.0 (*) 0.0 - 2.5    CBC     Status: Abnormal   Collection Time   03/31/11  7:45 AM      Component Value Range Comment   WBC 12.2 (*) 4.0 - 10.5 (K/uL)    RBC 5.00  3.87 - 5.11 (MIL/uL)    Hemoglobin 13.6  12.0 - 15.0 (g/dL)    HCT 40.9  81.1 - 91.4 (%)    MCV 79.0  78.0 - 100.0 (fL)    MCH 27.2  26.0 - 34.0 (pg)    MCHC 34.4  30.0 - 36.0 (g/dL)    RDW 78.2 (*) 95.6 - 15.5 (%)    Platelets 253  150 - 400 (K/uL)   DIFFERENTIAL     Status: Abnormal   Collection Time   03/31/11  7:45 AM      Component Value Range Comment   Neutrophils Relative 86 (*) 43 - 77 (%)    Neutro Abs 10.6 (*) 1.7 - 7.7 (K/uL)    Lymphocytes Relative 10 (*) 12 - 46 (%)    Lymphs Abs 1.3  0.7 - 4.0 (K/uL)    Monocytes  Relative 3  3 - 12 (%)    Monocytes Absolute 0.4  0.1 - 1.0 (K/uL)    Eosinophils Relative 0  0 - 5 (%)    Eosinophils Absolute 0.0  0.0 - 0.7 (K/uL)    Basophils Relative 0  0 - 1 (%)    Basophils Absolute 0.0  0.0 - 0.1 (K/uL)   COMPREHENSIVE METABOLIC PANEL     Status: Abnormal   Collection Time   03/31/11  7:45 AM      Component Value Range Comment   Sodium 129 (*) 135 - 145 (mEq/L)    Potassium 3.6  3.5 - 5.1 (mEq/L)    Chloride 90 (*) 96 - 112 (mEq/L)    CO2 28  19 - 32 (mEq/L)    Glucose, Bld 189 (*) 70 - 99 (mg/dL)    BUN 11  6 - 23 (mg/dL)    Creatinine, Ser 2.13  0.50 - 1.10 (mg/dL)    Calcium 9.9  8.4 - 10.5 (mg/dL)    Total Protein 7.4  6.0 - 8.3 (g/dL)    Albumin 4.0  3.5 - 5.2 (g/dL)    AST 18  0 - 37 (U/L)    ALT 13  0 - 35 (U/L)    Alkaline Phosphatase 69  39 - 117 (U/L)    Total Bilirubin 0.9  0.3 - 1.2 (mg/dL)    GFR calc non Af Amer >90  >90 (mL/min)    GFR calc Af Amer >90  >90 (mL/min)   LIPASE, BLOOD     Status: Normal   Collection Time   03/31/11  7:45 AM      Component Value Range Comment   Lipase 26  11 - 59 (U/L)   URINALYSIS, ROUTINE W REFLEX MICROSCOPIC     Status: Abnormal   Collection Time   03/31/11  8:07 AM      Component Value Range Comment   Color, Urine YELLOW  YELLOW     APPearance CLOUDY (*) CLEAR     Specific Gravity, Urine 1.025  1.005 - 1.030     pH 6.0  5.0 - 8.0     Glucose, UA 250 (*) NEGATIVE (mg/dL)    Hgb urine dipstick SMALL (*) NEGATIVE     Bilirubin Urine NEGATIVE  NEGATIVE     Ketones, ur 15 (*) NEGATIVE (mg/dL)    Protein, ur 30 (*) NEGATIVE (mg/dL)    Urobilinogen, UA 0.2  0.0 - 1.0 (mg/dL)    Nitrite NEGATIVE  NEGATIVE     Leukocytes, UA NEGATIVE  NEGATIVE    URINE MICROSCOPIC-ADD ON     Status: Abnormal   Collection Time   03/31/11  8:07 AM      Component Value Range Comment   Squamous Epithelial / LPF MANY (*) RARE     WBC, UA 3-6  <3 (WBC/hpf)    RBC / HPF 3-6  <3 (RBC/hpf)    Bacteria, UA MANY (*) RARE      Urine-Other MUCOUS PRESENT     LACTIC ACID, PLASMA     Status: Normal   Collection Time   03/31/11 12:10 PM      Component Value Range Comment   Lactic Acid, Venous 1.5  0.5 - 2.2 (mmol/L)    Ct Abdomen Pelvis W Contrast  03/31/2011  *RADIOLOGY REPORT*  Clinical Data: Upper abdominal pain with nausea and vomiting.  CT ABDOMEN AND PELVIS WITH CONTRAST  Technique:  Multidetector CT imaging of the abdomen and pelvis was performed following the standard protocol during bolus administration of intravenous contrast.  Contrast: 80mL OMNIPAQUE IOHEXOL 300 MG/ML IJ SOLN  Comparison: None.  Findings: Lung bases show scarring bilaterally, especially along an elevated right hemidiaphragm.  Heart is mildly enlarged.  Coronary artery calcification.  No pericardial effusion.  Liver, gallbladder, adrenal glands, kidneys, spleen, pancreas, stomach and proximal small bowel are unremarkable.  There are dilated loops of distal small bowel in the right lower quadrant, seen anterior to the colon. Mild associated mesenteric edema with small perihepatic ascites.  Colon appendix are unremarkable.  Uterus and ovaries are visualized.  Trace dependent pelvic free fluid.  Scattered atherosclerotic calcification of the arterial vasculature without abdominal aortic aneurysm.  No pathologically enlarged lymph nodes.  IMPRESSION:  1.  Obstructed distal small bowel, possibly due to an internal hernia. Associated mild mesenteric edema can be seen with early ischemia. Critical Value/emergent results were called by telephone at the time of interpretation on 03/31/2011  at 1100 hours  to  Dr. 02/06, who verbally acknowledged these results. 2.  Small perihepatic ascites.  Trace pelvic free fluid.  Original Report Authenticated By: Reyes Ivan, M.D.   Dg Abd Acute W/chest  03/30/2011  OVERREAD BY [facility] *RADIOLOGY REPORT*  Clinical Data: Abdominal pain with nausea, vomiting.  No diarrhea.  ACUTE ABDOMEN SERIES (ABDOMEN 2 VIEW &  CHEST 1 VIEW)  Comparison: None.  Findings: The heart size and mediastinal contours are normal.  Mild central airway thickening is noted in the lungs.  There is no confluent airspace opacity or pleural effusion.  The bowel gas pattern is normal.  There is fluid within the stomach.  No free intraperitoneal air or suspicious calcification is identified.  There are mild degenerative changes of the lower lumbar spine and the symphysis pubis.  IMPRESSION: No active cardiopulmonary or abdominal process demonstrated.  Original Report Authenticated By: Gerrianne Scale, M.D.    Review of Systems  Constitutional: Positive for chills, weight loss, malaise/fatigue and diaphoresis. Negative for fever.  HENT: Negative.  Negative for neck pain.   Eyes: Positive for redness.  Blind right eye from glaucoma  Respiratory: Positive for shortness of breath (1-2 blocks, some SOB). Negative for cough, hemoptysis, sputum production and wheezing.   Cardiovascular: Negative for palpitations, claudication, leg swelling and PND. Chest pain: occasional , followed by Dr.J Allyson Sabal, negative stress several years ago.  Gastrointestinal: Positive for heartburn, nausea, vomiting, abdominal pain and constipation (occasional  last BM yesterday.PM). Negative for diarrhea and blood in stool.  Genitourinary:       Some odor  Musculoskeletal: Positive for back pain (has disc dz, with prior injections.). Negative for myalgias, joint pain and falls.  Skin: Negative for itching and rash.  Neurological: Positive for weakness.  Endo/Heme/Allergies: Negative.   Psychiatric/Behavioral: Negative.     Blood pressure 177/83, pulse 66, temperature 98.7 F (37.1 C), temperature source Oral, resp. rate 18, SpO2 97.00%. Physical Exam  Constitutional: She is oriented to person, place, and time. She appears well-developed and well-nourished. No distress.       Just had NG placed, came out clear colorless, ?contrast media from CT  HENT:    Head: Normocephalic.  Eyes: EOM are normal. Left eye exhibits no discharge. No scleral icterus.       Blind Right eye, bilateral conjunctivitis,    Neck: Normal range of motion. Neck supple. No JVD present. No tracheal deviation present. No thyromegaly present.  Cardiovascular: Normal rate, regular rhythm, normal heart sounds and intact distal pulses.  Exam reveals no gallop.   No murmur heard. Respiratory: Effort normal and breath sounds normal. No respiratory distress. She has no wheezes. She has no rales. She exhibits no tenderness.  GI: Soft. She exhibits distension (mild). She exhibits no mass. There is tenderness (feels tight more than tender.  No real increased pain on plapation.). There is no rebound and no guarding.       Bowel sounds hyperactive  Musculoskeletal: She exhibits no edema and no tenderness.  Lymphadenopathy:    She has no cervical adenopathy.  Neurological: She is alert and oriented to person, place, and time. She has normal reflexes.  Skin: Skin is warm and dry.  Psychiatric: She has a normal mood and affect. Her behavior is normal. Judgment and thought content normal.     Assessment/Plan 1.SBO, no prior history of abdominal surgery; concern by CT of internal hernia/mesenteric edema with early ischemia. Lactic acid normal at 1.5 2. Hypertension  3. AODM 4.Hypothyroid on supplement 5. 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 6. Hx of chest pain with normal stress test about 4 years ago.    Plan:  NG has been placed.  We will admit her and DR. Gross will review film.  NPO, hydrate, bowel rest.  Tywan Siever 03/31/2011, 1:15 PM

## 2011-03-31 NOTE — ED Notes (Signed)
MD at bedside. 

## 2011-04-01 ENCOUNTER — Inpatient Hospital Stay (HOSPITAL_COMMUNITY): Payer: Medicare Other

## 2011-04-01 LAB — BASIC METABOLIC PANEL
Calcium: 9 mg/dL (ref 8.4–10.5)
Creatinine, Ser: 0.52 mg/dL (ref 0.50–1.10)
GFR calc Af Amer: >90 mL/min (ref >90)

## 2011-04-01 LAB — MAGNESIUM: Magnesium: 1.7 mg/dL (ref 1.5–2.5)

## 2011-04-01 LAB — GLUCOSE, CAPILLARY: Glucose-Capillary: 130 mg/dL — ABNORMAL HIGH (ref 70–99)

## 2011-04-01 LAB — CBC
MCHC: 32 g/dL (ref 30.0–36.0)
RDW: 16.5 % — ABNORMAL HIGH (ref 11.5–15.5)

## 2011-04-01 MED ORDER — MAGIC MOUTHWASH
15.0000 mL | Freq: Four times a day (QID) | ORAL | Status: DC | PRN
  Filled 2011-04-01: qty 15

## 2011-04-01 MED ORDER — LIP MEDEX EX OINT
1.0000 "application " | TOPICAL_OINTMENT | Freq: Two times a day (BID) | CUTANEOUS | Status: DC
  Administered 2011-04-01 – 2011-04-08 (×13): 1 via TOPICAL

## 2011-04-01 MED ORDER — INSULIN ASPART 100 UNIT/ML ~~LOC~~ SOLN
0.0000 [IU] | SUBCUTANEOUS | Status: DC
  Administered 2011-04-01 – 2011-04-03 (×5): 2 [IU] via SUBCUTANEOUS
  Administered 2011-04-03: 3 [IU] via SUBCUTANEOUS
  Administered 2011-04-04 (×3): 2 [IU] via SUBCUTANEOUS
  Administered 2011-04-05: 3 [IU] via SUBCUTANEOUS
  Administered 2011-04-05: 2 [IU] via SUBCUTANEOUS
  Administered 2011-04-05: 3 [IU] via SUBCUTANEOUS
  Administered 2011-04-05: 2 [IU] via SUBCUTANEOUS
  Administered 2011-04-06 (×3): 3 [IU] via SUBCUTANEOUS
  Filled 2011-04-01 (×2): qty 3

## 2011-04-01 MED ORDER — MENTHOL 3 MG MT LOZG
1.0000 | LOZENGE | OROMUCOSAL | Status: DC | PRN
  Administered 2011-04-05: 3 mg via ORAL
  Filled 2011-04-01: qty 9

## 2011-04-01 MED ORDER — PHENOL 1.4 % MT LIQD: 2.0000 | OROMUCOSAL | Status: DC | PRN

## 2011-04-01 MED ORDER — BISACODYL 10 MG RE SUPP
10.0000 mg | Freq: Every day | RECTAL | Status: DC
  Administered 2011-04-01 – 2011-04-05 (×4): 10 mg via RECTAL
  Filled 2011-04-01 (×4): qty 1

## 2011-04-01 NOTE — Progress Notes (Signed)
Priscilla Tran 08-19-1935 [id number]  PCP: Tally Due, MD, MD Outpatient Care Team: Patient Care Team: Tally Due, MD as PCP - General (Internal Medicine)  Inpatient Treatment Team: Treatment Team: Attending Provider: Md Montez Morita, MD; Consulting Physician: Md Montez Morita, MD; Registered Nurse: Dwan Bolt, RN; Technician: Fayrene Helper, NT; Registered Nurse: Naomie Dean, RN; Technician: Flossie Dibble, NT  Subjective:  Pt feels better No N/V Pain down - used narcotics x 2 doses last night only  Objective:  Vital signs:  Temp:  [98.6 F (37 C)-99.1 F (37.3 C)] 99.1 F (37.3 C) (03/05 0545) Pulse Rate:  [73-102] 73  (03/05 0545) Resp:  [18] 18  (03/05 0545) BP: (148-170)/(77-83) 154/77 mmHg (03/05 0545) SpO2:  [95 %-99 %] 96 % (03/05 0545) Weight:  [165 lb 2 oz (74.9 kg)] 165 lb 2 oz (74.9 kg) (03/04 1624) Last BM Date: 03/30/11  Intake/Output   Yesterday:  03/04 0701 - 03/05 0700 In: 743.8 [I.V.:543.8; NG/GT:200] Out: 900 [Urine:300] This shift:    NGT filter not working correctly - NGT flushed & filter replaced  Bowel function:  Flatus: N  BM: N  Physical Exam:  General: Pt awake/alert/oriented x4 in no acute distress Eyes: PERRL, normal EOM.  Sclera clear.  No icterus Neuro: CN II-XII intact w/o focal sensory/motor deficits. Lymph: No head/neck/groin lymphadenopathy Psych:  No delerium/psychosis/paranoia HENT: Normocephalic, Mucus membranes moist.  No thrush Neck: Supple, No tracheal deviation Chest: Clear.  No chest wall pain w good excursion CV:  Pulses intact.  Regular rhythm Abdomen: Obese.  Soft/Mildly distended.  Nontender.  No incarcerated hernias. Ext:  SCDs BLE.  No mjr edema.  No cyanosis Skin: No petechiae / purpurae  Results:   Labs: Results for orders placed during the hospital encounter of 03/31/11 (from the past 48 hour(s))  GLUCOSE, CAPILLARY     Status: Abnormal   Collection Time   03/31/11  7:24 AM     Component Value Range Comment   Glucose-Capillary 192 (*) 70 - 99 (mg/dL)   CARDIAC PANEL(CRET KIN+CKTOT+MB+TROPI)     Status: Abnormal   Collection Time   03/31/11  7:45 AM      Component Value Range Comment   Total CK 143  7 - 177 (U/L)    CK, MB 4.3 (*) 0.3 - 4.0 (ng/mL)    Troponin I <0.30  <0.30 (ng/mL)    Relative Index 3.0 (*) 0.0 - 2.5    CBC     Status: Abnormal   Collection Time   03/31/11  7:45 AM      Component Value Range Comment   WBC 12.2 (*) 4.0 - 10.5 (K/uL)    RBC 5.00  3.87 - 5.11 (MIL/uL)    Hemoglobin 13.6  12.0 - 15.0 (g/dL)    HCT 44.0  10.2 - 72.5 (%)    MCV 79.0  78.0 - 100.0 (fL)    MCH 27.2  26.0 - 34.0 (pg)    MCHC 34.4  30.0 - 36.0 (g/dL)    RDW 36.6 (*) 44.0 - 15.5 (%)    Platelets 253  150 - 400 (K/uL)   DIFFERENTIAL     Status: Abnormal   Collection Time   03/31/11  7:45 AM      Component Value Range Comment   Neutrophils Relative 86 (*) 43 - 77 (%)    Neutro Abs 10.6 (*) 1.7 - 7.7 (K/uL)    Lymphocytes Relative 10 (*) 12 - 46 (%)  Lymphs Abs 1.3  0.7 - 4.0 (K/uL)    Monocytes Relative 3  3 - 12 (%)    Monocytes Absolute 0.4  0.1 - 1.0 (K/uL)    Eosinophils Relative 0  0 - 5 (%)    Eosinophils Absolute 0.0  0.0 - 0.7 (K/uL)    Basophils Relative 0  0 - 1 (%)    Basophils Absolute 0.0  0.0 - 0.1 (K/uL)   COMPREHENSIVE METABOLIC PANEL     Status: Abnormal   Collection Time   03/31/11  7:45 AM      Component Value Range Comment   Sodium 129 (*) 135 - 145 (mEq/L)    Potassium 3.6  3.5 - 5.1 (mEq/L)    Chloride 90 (*) 96 - 112 (mEq/L)    CO2 28  19 - 32 (mEq/L)    Glucose, Bld 189 (*) 70 - 99 (mg/dL)    BUN 11  6 - 23 (mg/dL)    Creatinine, Ser 7.82  0.50 - 1.10 (mg/dL)    Calcium 9.9  8.4 - 10.5 (mg/dL)    Total Protein 7.4  6.0 - 8.3 (g/dL)    Albumin 4.0  3.5 - 5.2 (g/dL)    AST 18  0 - 37 (U/L)    ALT 13  0 - 35 (U/L)    Alkaline Phosphatase 69  39 - 117 (U/L)    Total Bilirubin 0.9  0.3 - 1.2 (mg/dL)    GFR calc non Af Amer >90  >90  (mL/min)    GFR calc Af Amer >90  >90 (mL/min)   LIPASE, BLOOD     Status: Normal   Collection Time   03/31/11  7:45 AM      Component Value Range Comment   Lipase 26  11 - 59 (U/L)   URINALYSIS, ROUTINE W REFLEX MICROSCOPIC     Status: Abnormal   Collection Time   03/31/11  8:07 AM      Component Value Range Comment   Color, Urine YELLOW  YELLOW     APPearance CLOUDY (*) CLEAR     Specific Gravity, Urine 1.025  1.005 - 1.030     pH 6.0  5.0 - 8.0     Glucose, UA 250 (*) NEGATIVE (mg/dL)    Hgb urine dipstick SMALL (*) NEGATIVE     Bilirubin Urine NEGATIVE  NEGATIVE     Ketones, ur 15 (*) NEGATIVE (mg/dL)    Protein, ur 30 (*) NEGATIVE (mg/dL)    Urobilinogen, UA 0.2  0.0 - 1.0 (mg/dL)    Nitrite NEGATIVE  NEGATIVE     Leukocytes, UA NEGATIVE  NEGATIVE    URINE MICROSCOPIC-ADD ON     Status: Abnormal   Collection Time   03/31/11  8:07 AM      Component Value Range Comment   Squamous Epithelial / LPF MANY (*) RARE     WBC, UA 3-6  <3 (WBC/hpf)    RBC / HPF 3-6  <3 (RBC/hpf)    Bacteria, UA MANY (*) RARE     Urine-Other MUCOUS PRESENT     LACTIC ACID, PLASMA     Status: Normal   Collection Time   03/31/11 12:10 PM      Component Value Range Comment   Lactic Acid, Venous 1.5  0.5 - 2.2 (mmol/L)   GLUCOSE, CAPILLARY     Status: Abnormal   Collection Time   03/31/11  2:47 PM      Component Value Range Comment  Glucose-Capillary 147 (*) 70 - 99 (mg/dL)   BASIC METABOLIC PANEL     Status: Abnormal   Collection Time   04/01/11  5:12 AM      Component Value Range Comment   Sodium 132 (*) 135 - 145 (mEq/L)    Potassium 4.0  3.5 - 5.1 (mEq/L)    Chloride 97  96 - 112 (mEq/L)    CO2 24  19 - 32 (mEq/L)    Glucose, Bld 141 (*) 70 - 99 (mg/dL)    BUN 9  6 - 23 (mg/dL)    Creatinine, Ser 1.61  0.50 - 1.10 (mg/dL)    Calcium 9.0  8.4 - 10.5 (mg/dL)    GFR calc non Af Amer >90  >90 (mL/min)    GFR calc Af Amer >90  >90 (mL/min)   MAGNESIUM     Status: Normal   Collection Time   04/01/11   5:12 AM      Component Value Range Comment   Magnesium 1.7  1.5 - 2.5 (mg/dL)   CBC     Status: Abnormal   Collection Time   04/01/11  5:12 AM      Component Value Range Comment   WBC 8.0  4.0 - 10.5 (K/uL)    RBC 4.79  3.87 - 5.11 (MIL/uL)    Hemoglobin 12.3  12.0 - 15.0 (g/dL)    HCT 09.6  04.5 - 40.9 (%)    MCV 80.2  78.0 - 100.0 (fL)    MCH 25.7 (*) 26.0 - 34.0 (pg)    MCHC 32.0  30.0 - 36.0 (g/dL)    RDW 81.1 (*) 91.4 - 15.5 (%)    Platelets 270  150 - 400 (K/uL)     Imaging / Studies: Ct Abdomen Pelvis W Contrast  03/31/2011  *RADIOLOGY REPORT*  Clinical Data: Upper abdominal pain with nausea and vomiting.  CT ABDOMEN AND PELVIS WITH CONTRAST  Technique:  Multidetector CT imaging of the abdomen and pelvis was performed following the standard protocol during bolus administration of intravenous contrast.  Contrast: 80mL OMNIPAQUE IOHEXOL 300 MG/ML IJ SOLN  Comparison: None.  Findings: Lung bases show scarring bilaterally, especially along an elevated right hemidiaphragm.  Heart is mildly enlarged.  Coronary artery calcification.  No pericardial effusion.  Liver, gallbladder, adrenal glands, kidneys, spleen, pancreas, stomach and proximal small bowel are unremarkable.  There are dilated loops of distal small bowel in the right lower quadrant, seen anterior to the colon. Mild associated mesenteric edema with small perihepatic ascites.  Colon appendix are unremarkable.  Uterus and ovaries are visualized.  Trace dependent pelvic free fluid.  Scattered atherosclerotic calcification of the arterial vasculature without abdominal aortic aneurysm.  No pathologically enlarged lymph nodes.  IMPRESSION:  1.  Obstructed distal small bowel, possibly due to an internal hernia. Associated mild mesenteric edema can be seen with early ischemia. Critical Value/emergent results were called by telephone at the time of interpretation on 03/31/2011  at 1100 hours  to  Dr. 02/06, who verbally acknowledged these  results. 2.  Small perihepatic ascites.  Trace pelvic free fluid.  Original Report Authenticated By: Reyes Ivan, M.D.   Dg Abd Acute W/chest  03/30/2011  OVERREAD BY [facility] *RADIOLOGY REPORT*  Clinical Data: Abdominal pain with nausea, vomiting.  No diarrhea.  ACUTE ABDOMEN SERIES (ABDOMEN 2 VIEW & CHEST 1 VIEW)  Comparison: None.  Findings: The heart size and mediastinal contours are normal.  Mild central airway thickening is noted in the  lungs.  There is no confluent airspace opacity or pleural effusion.  The bowel gas pattern is normal.  There is fluid within the stomach.  No free intraperitoneal air or suspicious calcification is identified.  There are mild degenerative changes of the lower lumbar spine and the symphysis pubis.  IMPRESSION: No active cardiopulmonary or abdominal process demonstrated.  Original Report Authenticated By: Gerrianne Scale, M.D.    Medications / Allergies: per chart  Antibiotics: Anti-infectives    None      Assessment  Taraann L Decola  76 y.o. female       Problem List:  Principal Problem:  *SBO (small bowel obstruction) Active Problems:  Hypothyroid  Hypertension  Diabetes type 2, controlled   SBO improving clinically  Plan: -NGT -VTE prophylaxis- SCDs, etc -mobilize as tolerated to help recovery -DM - glc control - add SSI with glc >140 -HTN - follow on IV metoprolol   The patient is stable.  There is no evidence of peritonitis, acute abdomen, nor shock.  There is no strong evidence of failure of improvement nor decline with current non-operative management.  There is no need for surgery at the present moment.  We will continue to follow.  If no improvement or worsens, pt need exploration   Ardeth Sportsman, M.D., F.A.C.S. Gastrointestinal and Minimally Invasive Surgery [facility] [address], [address] [place], [place] [address] [phone] Main / Paging [phone] Voice  Mail   04/01/2011

## 2011-04-01 NOTE — H&P (Signed)
CT scan concerning but abd exam showing no peritonitis & pt feeling better w NGT in Follow closely OR if worsens or fails to improve

## 2011-04-02 LAB — URINE CULTURE: Culture  Setup Time: 201303050135

## 2011-04-02 LAB — GLUCOSE, CAPILLARY
Glucose-Capillary: 124 mg/dL — ABNORMAL HIGH (ref 70–99)
Glucose-Capillary: 96 mg/dL (ref 70–99)
Glucose-Capillary: 119 mg/dL — ABNORMAL HIGH (ref 70–99)
Glucose-Capillary: 112 mg/dL — ABNORMAL HIGH (ref 70–99)

## 2011-04-02 LAB — CREATININE, SERUM
Creatinine, Ser: 0.5 mg/dL (ref 0.50–1.10)
GFR calc non Af Amer: >90 mL/min (ref >90)

## 2011-04-02 LAB — HEMOGLOBIN A1C
Hgb A1c MFr Bld: 7 % — ABNORMAL HIGH (ref <5.7)
Mean Plasma Glucose: 154 mg/dL — ABNORMAL HIGH (ref <117)

## 2011-04-02 LAB — CBC
MCHC: 33.5 g/dL (ref 30.0–36.0)
Platelets: 246 10*3/uL (ref 150–400)
RDW: 16.3 % — ABNORMAL HIGH (ref 11.5–15.5)
WBC: 6.1 10*3/uL (ref 4.0–10.5)

## 2011-04-02 MED ORDER — BIMATOPROST 0.03 % OP SOLN: 1.0000 [drp] | Freq: Every day | OPHTHALMIC | Status: DC

## 2011-04-02 MED ORDER — METOPROLOL TARTRATE 1 MG/ML IV SOLN
10.0000 mg | Freq: Four times a day (QID) | INTRAVENOUS | Status: DC
  Administered 2011-04-02 – 2011-04-08 (×17): 10 mg via INTRAVENOUS
  Filled 2011-04-02 (×28): qty 10

## 2011-04-02 MED ORDER — BRIMONIDINE TARTRATE 0.2 % OP SOLN: 1.0000 [drp] | Freq: Two times a day (BID) | OPHTHALMIC | Status: DC

## 2011-04-02 MED ORDER — DORZOLAMIDE HCL-TIMOLOL MAL 2-0.5 % OP SOLN: 1.0000 [drp] | Freq: Two times a day (BID) | OPHTHALMIC | Status: DC

## 2011-04-02 NOTE — Progress Notes (Signed)
Pt having BMs this afternoon Cramping less NGT output less Will try clamping NGT Seems to be avoiding need for OR so far.Marland KitchenMarland Kitchen

## 2011-04-02 NOTE — Progress Notes (Signed)
Subjective: Tm99.2  BP up some, 172/65, i/o= 2828/1550  250 ml NG, No labs  She isn't really any better or worse.  So dizziness and nausea earlier.  No flatus.    Objective: Vital signs in last 24 hours: Temp:  [98.2 F (36.8 C)-99.5 F (37.5 C)] 98.2 F (36.8 C) (03/06 0902) Pulse Rate:  [54-123] 54  (03/06 0902) Resp:  [18-20] 18  (03/06 0902) BP: (141-172)/(63-114) 172/65 mmHg (03/06 0902) SpO2:  [93 %-97 %] 97 % (03/06 0902) Last BM Date: 03/30/11  Intake/Output from previous day: 03/05 0701 - 03/06 0700 In: 2828 [I.V.:2588; NG/GT:240] Out: 1550 [Urine:1300; Emesis/NG output:250] Intake/Output this shift: Total I/O In: -  Out: 200 [Urine:200]  PE:  Alert,  Chest:  Clear Abd: soft, +BS, non tender, no flatus or BM  Lab Results:   [facility] 04/02/11 0530 04/01/11 0512  WBC 6.1 8.0  HGB 12.9 12.3  HCT 38.5 38.4  PLT 246 270    BMET  Basename 04/02/11 0530 04/01/11 0512 03/31/11 0745  NA -- 132* 129*  K 3.7 4.0 --  CL -- 97 90*  CO2 -- 24 28  GLUCOSE -- 141* 189*  BUN -- 9 11  CREATININE 0.50 0.52 --  CALCIUM -- 9.0 9.9   PT/INR No results found for this basename: LABPROT:2,INR:2 in the last 72 hours   Studies/Results: Dg Abd Portable 2v  04/01/2011  *RADIOLOGY REPORT*  Clinical Data: Follow-up small bowel obstruction.  PORTABLE ABDOMEN - 2 VIEW  Comparison:  CT scan 03/31/2011.  Findings: The NG tube is in the stomach.  There are persistent dilated air and contrast filled small bowel loops in the lower abdomen and pelvis.  Air fluid levels are noted on the decubitus film.  No free air.Bibasilar atelectasis is noted.  IMPRESSION: 1.  NG tube is in the stomach. 2.  Persistent small bowel obstruction bowel gas pattern.  Original Report Authenticated By: P. Loralie Champagne, M.D.    Anti-infectives: Anti-infectives    None     Current Facility-Administered Medications  Medication Dose Route Frequency Provider Last Rate Last Dose  . 0.9 % NaCl with KCl 20  mEq/ L  infusion   Intravenous Continuous Sherrie George, [place] 125 mL/hr at 04/02/11 [phone]    . acetaminophen (TYLENOL) tablet 650 mg  650 mg Oral Q6H PRN Sherrie George, PA       Or  . acetaminophen (TYLENOL) suppository 650 mg  650 mg Rectal Q6H PRN Sherrie George, PA      . bimatoprost (LUMIGAN) 0.01 % ophthalmic solution 1 drop  1 drop Left Eye QHS Sherrie George, [place]   1 drop at 04/01/11 2206  . bisacodyl (DULCOLAX) suppository 10 mg  10 mg Rectal Daily Ardeth Sportsman, MD   10 mg at 04/02/11 1014  . brimonidine (ALPHAGAN) 0.2 % ophthalmic solution 1 drop  1 drop Left Eye Daily Sherrie George, [place]   1 drop at 04/02/11 1014  . diphenhydrAMINE (BENADRYL) injection 12.5 mg  12.5 mg Intravenous Q6H PRN Sherrie George, PA      . dorzolamide-timolol (COSOPT) 22.3-6.8 MG/ML ophthalmic solution 1 drop  1 drop Left Eye BID Sherrie George, [place]   1 drop at 04/02/11 1014  . heparin injection 5,000 Units  5,000 Units Subcutaneous Q8H Sherrie George, [place]   5,000 Units at 04/02/11 0558  . HYDROmorphone (DILAUDID) injection 0.5-1 mg  0.5-1 mg Intravenous Q1H PRN Sherrie George, PA   1 mg at 04/02/11 1139  . insulin aspart (novoLOG)  injection 0-15 Units  0-15 Units Subcutaneous Q4H Ardeth Sportsman, MD   2 Units at 04/02/11 [phone]  . levothyroxine (SYNTHROID, LEVOTHROID) injection 76 mcg  76 mcg Intravenous Daily Sherrie George, [place]   76 mcg at 04/02/11 1014  . lip balm (CARMEX) ointment 1 application  1 application Topical BID Ardeth Sportsman, MD   1 application at 04/02/11 1015  . magic mouthwash  15 mL Oral QID PRN Ardeth Sportsman, MD      . menthol-cetylpyridinium (CEPACOL) lozenge 3 mg  1 lozenge Oral PRN Ardeth Sportsman, MD      . metoprolol (LOPRESSOR) injection 5 mg  5 mg Intravenous Q6H Sherrie George, [place]   5 mg at 04/02/11 0558  . ondansetron (ZOFRAN) injection 4 mg  4 mg Intravenous Q6H PRN Sherrie George, PA   4 mg at 04/02/11 0124  . pantoprazole (PROTONIX) injection 40 mg  40 mg  Intravenous QHS Sherrie George, [place]   40 mg at 04/01/11 2206  . phenol (CHLORASEPTIC) mouth spray 2 spray  2 spray Mouth/Throat PRN Ardeth Sportsman, MD        Assessment/Plan 1.SBO, no prior history of abdominal surgery; concern by CT of internal hernia/mesenteric edema with early ischemia. Lactic acid normal at 1.5  2. Hypertension  3. 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  6. Hx of chest pain with normal stress test about 4 years ago.   Plan:  Continue NG, mobilize, I will repeat labs and film in AM.     LOS: 2 days    Ranny Wiebelhaus 04/02/2011

## 2011-04-03 ENCOUNTER — Inpatient Hospital Stay (HOSPITAL_COMMUNITY): Payer: Medicare Other

## 2011-04-03 LAB — BASIC METABOLIC PANEL
BUN: 8 mg/dL (ref 6–23)
Calcium: 8.8 mg/dL (ref 8.4–10.5)
GFR calc Af Amer: >90 mL/min (ref >90)
GFR calc non Af Amer: >90 mL/min (ref >90)
Glucose, Bld: 109 mg/dL — ABNORMAL HIGH (ref 70–99)
Potassium: 3.2 mEq/L — ABNORMAL LOW (ref 3.5–5.1)
Sodium: 131 mEq/L — ABNORMAL LOW (ref 135–145)

## 2011-04-03 LAB — CBC
HCT: 38.6 % (ref 36.0–46.0)
Hemoglobin: 12.4 g/dL (ref 12.0–15.0)
MCH: 25.7 pg — ABNORMAL LOW (ref 26.0–34.0)
MCHC: 32.1 g/dL (ref 30.0–36.0)
RDW: 15.8 % — ABNORMAL HIGH (ref 11.5–15.5)

## 2011-04-03 LAB — GLUCOSE, CAPILLARY
Glucose-Capillary: 122 mg/dL — ABNORMAL HIGH (ref 70–99)
Glucose-Capillary: 109 mg/dL — ABNORMAL HIGH (ref 70–99)
Glucose-Capillary: 115 mg/dL — ABNORMAL HIGH (ref 70–99)

## 2011-04-03 NOTE — Progress Notes (Signed)
Give PO trial since had BMs and flatus & low NGT output.  If not progressing well or worsens, then may need Dx/Ex lap tomorrow

## 2011-04-03 NOTE — Progress Notes (Signed)
Still feels distended, bloated, larger than yesterday.She had BM's yesterday, but not having any flatus, or BM now.  I told her to go slow with liquids.  We will give her an opportunity to progress, but if not better will consider exploratory Lap tomorrow AM.   I have made her NPO after MN. She and her daughter are  In the room and understand.

## 2011-04-03 NOTE — Progress Notes (Signed)
  Subjective: Has had a couple of e/o nausea since NGT clamped, resolved with Zofran. No emesis. Some mild lower abd crampy pain last night but none now. +flatus last night, none this morning so far. No further BM's.  Objective: Vital signs in last 24 hours: Temp:  [97.9 F (36.6 C)-99.4 F (37.4 C)] 98.2 F (36.8 C) 05/01/2022 0644) Pulse Rate:  [56-75] 57  2022/05/01 0644) Resp:  [18-20] 18  May 01, 2022 0644) BP: (137-176)/(61-79) 137/75 mmHg 05-01-2022 0645) SpO2:  [92 %-97 %] 94 % 2022/05/01 0552) Last BM Date: 04/02/11  Intake/Output from previous day: 03/06 0701 - 05-01-2022 0700 In: 3544.6 [I.V.:3194.6; NG/GT:350] Out: 453 [Urine:450; Stool:3]  General appearance: alert and no distress Resp: clear to auscultation bilaterally Cardio: regular rate and rhythm GI: Soft, mild TTP RLQ, +BS.  Lab Results:   Basename 2011-05-01 0525 04/02/11 0530  WBC 4.5 6.1  HGB 12.4 12.9  HCT 38.6 38.5  PLT 249 246   BMET  Basename 05-01-11 0525 04/02/11 0530 04/01/11 0512  NA 131* -- 132*  K 3.2* 3.7 --  CL 94* -- 97  CO2 24 -- 24  GLUCOSE 109* -- 141*  BUN 8 -- 9  CREATININE 0.47* 0.50 --  CALCIUM 8.8 -- 9.0    Studies/Results: Dg Abd 2 Views  05/01/11  *RADIOLOGY REPORT*  Clinical Data: Abdominal pain and abdominal distension (slightly decreased today).  ABDOMEN - 2 VIEW  Comparison: Abdominal radiograph dated 04/01/2011.  CT of abdomen and pelvis dated 03/31/2011.  Findings: Nasogastric tube is seen, with the side port projecting over the antral prepyloric region of the stomach, and tip potentially the proximal small bowel.  There is a paucity of colonic gas and stool, with only minimal distal rectal gas. Numerous dilated loops of gas-filled small bowel are again noted, measuring up to 4.1 cm in diameter.  No gross evidence of pneumoperitoneum.  IMPRESSION: 1.  The bowel gas pattern is again consistent with a small bowel obstruction.  Original Report Authenticated By: Florencia Reasons, M.D.      Assessment/Plan: 1.SBO -- Has done pretty well with NGT clamped. Will have RN check residual, if <1108ml will d/c tube. Give clears. 2. Hypertension  3. 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  6. Hx of chest pain with normal stress test about 4 years ago.    LOS: 3 days    Kenetra Hildenbrand J. 05-01-11

## 2011-04-04 ENCOUNTER — Encounter (HOSPITAL_COMMUNITY): Payer: Self-pay | Admitting: Anesthesiology

## 2011-04-04 ENCOUNTER — Encounter (HOSPITAL_COMMUNITY): Admission: EM | Disposition: A | Discharge status: Home / Self Care | Payer: Self-pay | Source: Home / Self Care

## 2011-04-04 ENCOUNTER — Inpatient Hospital Stay (HOSPITAL_COMMUNITY): Payer: Medicare Other | Admitting: Anesthesiology

## 2011-04-04 DIAGNOSIS — K565 Intestinal adhesions [bands], unspecified as to partial versus complete obstruction: Secondary | ICD-10-CM

## 2011-04-04 HISTORY — PX: LAPAROSCOPY: SHX197

## 2011-04-04 LAB — BASIC METABOLIC PANEL
BUN: 6 mg/dL (ref 6–23)
CO2: 29 mEq/L (ref 19–32)
Calcium: 8.3 mg/dL — ABNORMAL LOW (ref 8.4–10.5)
Creatinine, Ser: 0.56 mg/dL (ref 0.50–1.10)
GFR calc non Af Amer: >90 mL/min (ref >90)
Glucose, Bld: 133 mg/dL — ABNORMAL HIGH (ref 70–99)

## 2011-04-04 LAB — GLUCOSE, CAPILLARY
Glucose-Capillary: 122 mg/dL — ABNORMAL HIGH (ref 70–99)
Glucose-Capillary: 134 mg/dL — ABNORMAL HIGH (ref 70–99)

## 2011-04-04 SURGERY — LAPAROSCOPY, DIAGNOSTIC
Anesthesia: General | Site: Abdomen | Wound class: Clean

## 2011-04-04 MED ORDER — LIDOCAINE HCL (CARDIAC) 20 MG/ML IV SOLN
INTRAVENOUS | Status: DC | PRN
  Administered 2011-04-04: 50 mg via INTRAVENOUS

## 2011-04-04 MED ORDER — HYDRALAZINE HCL 20 MG/ML IJ SOLN
INTRAMUSCULAR | Status: DC | PRN
  Administered 2011-04-04: 5 mg via INTRAVENOUS

## 2011-04-04 MED ORDER — HYDROMORPHONE HCL PF 1 MG/ML IJ SOLN: 0.2500 mg | INTRAMUSCULAR | Status: DC | PRN

## 2011-04-04 MED ORDER — SODIUM CHLORIDE 0.9 % IV SOLN
1.0000 g | INTRAVENOUS | Status: DC
  Administered 2011-04-04 – 2011-04-05 (×2): 1 g via INTRAVENOUS
  Filled 2011-04-04 (×3): qty 1

## 2011-04-04 MED ORDER — SODIUM CHLORIDE 0.9 % IV SOLN
INTRAVENOUS | Status: DC
  Administered 2011-04-04 (×2): via INTRAVENOUS
  Administered 2011-04-05: 1000 mL via INTRAVENOUS
  Administered 2011-04-06: 14:00:00 via INTRAVENOUS
  Administered 2011-04-06: 1000 mL via INTRAVENOUS

## 2011-04-04 MED ORDER — BUPIVACAINE-EPINEPHRINE 0.25% -1:200000 IJ SOLN
INTRAMUSCULAR | Status: AC
  Filled 2011-04-04: qty 1

## 2011-04-04 MED ORDER — ACETAMINOPHEN 10 MG/ML IV SOLN
INTRAVENOUS | Status: DC | PRN
  Administered 2011-04-04: 1000 mg via INTRAVENOUS

## 2011-04-04 MED ORDER — ROCURONIUM BROMIDE 100 MG/10ML IV SOLN
INTRAVENOUS | Status: DC | PRN
  Administered 2011-04-04: 30 mg via INTRAVENOUS

## 2011-04-04 MED ORDER — FENTANYL CITRATE 0.05 MG/ML IJ SOLN
INTRAMUSCULAR | Status: DC | PRN
  Administered 2011-04-04 (×3): 50 ug via INTRAVENOUS
  Administered 2011-04-04: 100 ug via INTRAVENOUS

## 2011-04-04 MED ORDER — LACTATED RINGERS IV SOLN
INTRAVENOUS | Status: DC
  Administered 2011-04-04: 1000 mL via INTRAVENOUS

## 2011-04-04 MED ORDER — SUCCINYLCHOLINE CHLORIDE 20 MG/ML IJ SOLN
INTRAMUSCULAR | Status: DC | PRN
  Administered 2011-04-04: 100 mg via INTRAVENOUS

## 2011-04-04 MED ORDER — PROPOFOL 10 MG/ML IV BOLUS
INTRAVENOUS | Status: DC | PRN
  Administered 2011-04-04: 150 mg via INTRAVENOUS

## 2011-04-04 MED ORDER — HYDROMORPHONE HCL PF 1 MG/ML IJ SOLN
0.5000 mg | INTRAMUSCULAR | Status: DC | PRN
  Administered 2011-04-04: 1 mg via INTRAVENOUS
  Administered 2011-04-05 (×2): 2 mg via INTRAVENOUS
  Administered 2011-04-05 – 2011-04-08 (×5): 1 mg via INTRAVENOUS
  Filled 2011-04-04: qty 1
  Filled 2011-04-04: qty 2
  Filled 2011-04-04 (×3): qty 1
  Filled 2011-04-04: qty 2
  Filled 2011-04-04: qty 1

## 2011-04-04 MED ORDER — GLYCOPYRROLATE 0.2 MG/ML IJ SOLN
INTRAMUSCULAR | Status: DC | PRN
  Administered 2011-04-04: 0.2 mg via INTRAVENOUS

## 2011-04-04 MED ORDER — EPHEDRINE SULFATE 50 MG/ML IJ SOLN
INTRAMUSCULAR | Status: DC | PRN
  Administered 2011-04-04: 10 mg via INTRAVENOUS

## 2011-04-04 MED ORDER — POTASSIUM CHLORIDE 10 MEQ/100ML IV SOLN
10.0000 meq | INTRAVENOUS | Status: AC
  Administered 2011-04-04: 10 meq via INTRAVENOUS
  Filled 2011-04-04 (×4): qty 100

## 2011-04-04 MED ORDER — BISACODYL 10 MG RE SUPP: 10.0000 mg | Freq: Two times a day (BID) | RECTAL | Status: DC | PRN

## 2011-04-04 MED ORDER — FENTANYL CITRATE 0.05 MG/ML IJ SOLN
50.0000 ug | INTRAMUSCULAR | Status: DC | PRN
  Administered 2011-04-04 (×2): 50 ug via INTRAVENOUS

## 2011-04-04 MED ORDER — BUPIVACAINE-EPINEPHRINE PF 0.25-1:200000 % IJ SOLN
INTRAMUSCULAR | Status: DC | PRN
  Administered 2011-04-04: 50 mL

## 2011-04-04 MED ORDER — PROMETHAZINE HCL 25 MG/ML IJ SOLN: 6.2500 mg | INTRAMUSCULAR | Status: DC | PRN

## 2011-04-04 MED ORDER — DEXAMETHASONE SODIUM PHOSPHATE 10 MG/ML IJ SOLN
INTRAMUSCULAR | Status: DC | PRN
  Administered 2011-04-04: 10 mg via INTRAVENOUS

## 2011-04-04 MED ORDER — HYDROMORPHONE BOLUS VIA INFUSION: 0.5000 mg | INTRAVENOUS | Status: DC | PRN

## 2011-04-04 MED ORDER — ACETAMINOPHEN 10 MG/ML IV SOLN
INTRAVENOUS | Status: AC
  Filled 2011-04-04: qty 100

## 2011-04-04 MED ORDER — LACTATED RINGERS IV SOLN
INTRAVENOUS | Status: DC | PRN
  Administered 2011-04-04 (×2): via INTRAVENOUS

## 2011-04-04 MED ORDER — KETOROLAC TROMETHAMINE 15 MG/ML IJ SOLN
15.0000 mg | Freq: Four times a day (QID) | INTRAMUSCULAR | Status: DC | PRN
  Administered 2011-04-07: 15 mg via INTRAVENOUS
  Filled 2011-04-04: qty 1

## 2011-04-04 MED ORDER — ONDANSETRON HCL 4 MG/2ML IJ SOLN
INTRAMUSCULAR | Status: DC | PRN
  Administered 2011-04-04: 4 mg via INTRAVENOUS

## 2011-04-04 MED ORDER — DROPERIDOL 2.5 MG/ML IJ SOLN
INTRAMUSCULAR | Status: DC | PRN
  Administered 2011-04-04: 0.625 mg via INTRAVENOUS

## 2011-04-04 SURGICAL SUPPLY — 38 items
APPLIER CLIP ROT 10 11.4 M/L (STAPLE)
CANISTER SUCTION 2500CC (MISCELLANEOUS) ×6 IMPLANT
CLIP APPLIE ROT 10 11.4 M/L (STAPLE) IMPLANT
CLOTH BEACON ORANGE TIMEOUT ST (SAFETY) ×3 IMPLANT
CORD HIGH FREQUENCY UNIPOLAR (ELECTROSURGICAL) ×3 IMPLANT
DECANTER SPIKE VIAL GLASS SM (MISCELLANEOUS) ×3 IMPLANT
DRAPE LAPAROSCOPIC ABDOMINAL (DRAPES) ×3 IMPLANT
DRSG TEGADERM 2-3/8X2-3/4 SM (GAUZE/BANDAGES/DRESSINGS) ×12 IMPLANT
DRSG TEGADERM 4X4.75 (GAUZE/BANDAGES/DRESSINGS) ×3 IMPLANT
ELECT REM PT RETURN 9FT ADLT (ELECTROSURGICAL) ×3
ELECTRODE REM PT RTRN 9FT ADLT (ELECTROSURGICAL) ×2 IMPLANT
ENDOLOOP SUT PDS II  0 18 (SUTURE)
ENDOLOOP SUT PDS II 0 18 (SUTURE) IMPLANT
GLOVE BIOGEL PI IND STRL 7.0 (GLOVE) ×4 IMPLANT
GLOVE BIOGEL PI INDICATOR 7.0 (GLOVE) ×2
GLOVE ECLIPSE 8.0 STRL XLNG CF (GLOVE) ×6 IMPLANT
GLOVE INDICATOR 8.0 STRL GRN (GLOVE) ×6 IMPLANT
GOWN STRL NON-REIN LRG LVL3 (GOWN DISPOSABLE) ×9 IMPLANT
GOWN STRL REIN XL XLG (GOWN DISPOSABLE) ×6 IMPLANT
KIT BASIN OR (CUSTOM PROCEDURE TRAY) ×3 IMPLANT
NS IRRIG 1000ML POUR BTL (IV SOLUTION) ×6 IMPLANT
PENCIL BUTTON HOLSTER BLD 10FT (ELECTRODE) ×3 IMPLANT
POUCH SPECIMEN RETRIEVAL 10MM (ENDOMECHANICALS) IMPLANT
SCISSORS LAP 5X35 DISP (ENDOMECHANICALS) ×3 IMPLANT
SET IRRIG TUBING LAPAROSCOPIC (IRRIGATION / IRRIGATOR) ×3 IMPLANT
SLEEVE Z-THREAD 5X100MM (TROCAR) ×3 IMPLANT
SOLUTION ANTI FOG 6CC (MISCELLANEOUS) ×3 IMPLANT
SUCTION POOLE TIP (SUCTIONS) ×3 IMPLANT
SUT MNCRL AB 4-0 PS2 18 (SUTURE) ×3 IMPLANT
SUT SILK 2 0 SH CR/8 (SUTURE) ×3 IMPLANT
SUT SILK 3 0 SH CR/8 (SUTURE) ×3 IMPLANT
TOWEL OR 17X26 10 PK STRL BLUE (TOWEL DISPOSABLE) ×3 IMPLANT
TRAY FOLEY CATH 14FRSI W/METER (CATHETERS) ×3 IMPLANT
TRAY LAP CHOLE (CUSTOM PROCEDURE TRAY) ×3 IMPLANT
TROCAR Z-THREAD FIOS 12X100MM (TROCAR) IMPLANT
TROCAR Z-THREAD FIOS 5X100MM (TROCAR) ×12 IMPLANT
TROCAR Z-THREAD SLEEVE 11X100 (TROCAR) IMPLANT
TUBING INSUFFLATION 10FT LAP (TUBING) ×3 IMPLANT

## 2011-04-04 NOTE — Op Note (Signed)
[name], Tran NO.:  [id number]  MEDICAL RECORD NO.:  [id number]  LOCATION:  1516                         FACILITY:  [facility]  PHYSICIAN:  Ardeth Sportsman, MD     DATE OF BIRTH:  1935-09-10  DATE OF PROCEDURE: DATE OF DISCHARGE:                              OPERATIVE REPORT   PRIMARY CARE PHYSICIAN:  Dr. Cristal Deer Guest.  SURGEON:  Ardeth Sportsman, MD.  ASSIST:  RN.  PREOPERATIVE DIAGNOSIS:  Small bowel obstruction, most likely secondary to internal adhesions.  POSTOPERATIVE DIAGNOSIS:  Small bowel obstruction secondary to pelvic adhesions.  PROCEDURE PERFORMED:  Laparoscopic lysis of adhesions.  ANESTHESIA: 1. General anesthesia. 2. Local anesthetic and a field block around all port sites.  SPECIMEN:  Extended epiploic appendages forming as adhesive bands (not sent).  Note, that was the sigmoid colon.  DRAINS:  None.  ESTIMATED BLOOD LOSS:  Minimal.  COMPLICATIONS:  None apparent.  INDICATIONS:  [name] is a 76 year old obese female,  with no prior abdominal surgeries, who came in with nausea, vomiting, and abdominal pain.  CT scan was worried about bowel obstruction.  There were some concern of a possible inflammation or hernia or internal hernia.  A nasogastric tube was placed and her pain markedly decreased and she felt better.  Initially, she seemed to have decreased output and began to have flatus.  Her NG tube was removed.  We advanced her diet.  She had recurrent abdominal pain.  Given the fact that she had recurrent symptoms, we felt she would benefit from surgery to rule out a source of her bowel obstruction.  Pathophysiology of small bowel obstruction with differential diagnosis was discussed.  There was no evidence of any hernia.  Recommendations made for laparoscopic possible open lysis of adhesions, possible bowel resection was discussed.  Risks, benefits, and alternatives were discussed.  Questions answered and she  agreed to proceed.  OPERATIVE FINDINGS:  She had pelvic adhesions of her sigmoid colon to both ovaries.  The adhesion to the right ovary and right pelvic wall formed an internal band from which mid ileum was incarcerated within.  There was some mild ischemia of the middle ileum, but pinked up at the end of the case.  There was no evidence of any hernias or other major abnormalities.  She has moderate diverticulosis.  DESCRIPTION OF PROCEDURE:  Informed consent was confirmed.  The patient underwent general anesthesia without any difficulty.  She was already on antibiotics.  She was positioned supine with arms tucked.  Her abdomen was prepped and draped in a sterile fashion.  Surgical time-out confirmed our plan.  I placed a 5 mm port in the left upper quadrant using optical entry technique.  Entry was clean.  The patient had been placed in steep reverse Trendelenburg and left side up.  She had a nasogastric tube in place, which evacuated some gas and contents to help decompress the stomach.  We induced carbon dioxide insufflation.  Camera inspection revealed no injury.  I placed 5 mm ports in the left mid abdomen and left lower quadrant.  Later, I placed one through the inferior part of the umbilicus.  I did  inspection of the abdomen.  She had no greater omentum involvement.  Her small bowel was markedly dilated, but I did have enough working space.  I was able to move some of the small bowel up towards the upper abdomen.  I did find the cecum and the terminal ileum which was decompressed.  I ran the terminal ileum until it was stuck down in the pelvis.  I began to reflect some small bowel up out of the pelvis.  I found the sigmoid colon, began to try and pull that out of the pelvis.  I had some resistance.  I freed epiploic appendage adhesions to the left ovary with a few wispy bands that I freed off.  I followed the sigmoid colon down and found another band going towards the right  pelvis in the right ovary.  This was the area with some incarcerated bowel & mild ischemia associated with it.  I eventually was able to elevate the uterus anteriorly and free off the sigmoid colon from its right pelvic adhesions.  With that, I found the final band going towards the right ovary.  I elevated, isolated, and free that off.  That released the small bowel.  That was the transition point.  The small bowel immediately pinked up.  I ran the small bowel from the ileocecal valve proximally and saw no evidence of any ischemia or necrosis or perforation.  There was no serosal tear enterotomy.  There were no stricturings or bands.  I went and removed off the redundant epiploic appendage and those adhesive bands so there be no more leaks points or new potential areas.  I irrigated the abdomen copiously with 3 L of saline.  I reinspected the bowel and it was almost normal and had markedly improved.  Based on improvements, I did not feel any bowel resection as needed.  I saw no evidence of any bleeding or other abnormalities.  Hemostasis was excellent.  No bowel injury.  I evacuated carbon dioxide and removed the ports.  We closed the 5 mm ports using 4-0 Monocryl stitch.  Sterile dressing was applied.  The patient was being extubated.  I am going to try and find the family and discuss the operative findings with them.     Ardeth Sportsman, MD     SCG/MEDQ  D:  04/04/2011  T:  04/04/2011  Job:  [id number]  cc:   Dr. Kathleen Lime

## 2011-04-04 NOTE — Progress Notes (Signed)
Failure to progress Needs exploration  The anatomy & physiology of the digestive tract was discussed.  The pathophysiology of intestinal obstruction was discussed.  Natural history risks without surgery was discussed.   I feel the patient has failed non-operative therapies.  The risks of no intervention will lead to serious problems such as necrosis, perforation, dehydration, etc. that outweigh the operative risks; therefore, I recommended abdominal exploration to diagnose & treat the source of the problem.  Laparoscopic & open techniques were discussed.   I expressed a good likelihood that surgery will treat the problem.  Risks such as bleeding, infection, abscess, leak, reoperation, bowel resection, possible ostomy, hernia, heart attack, death, and other risks were discussed.   I noted a good likelihood this will help address the problem.  Goals of post-operative recovery were discussed as well.  We will work to minimize complications. Questions were answered.  The patient expresses understanding & wishes to proceed with surgery.

## 2011-04-04 NOTE — Anesthesia Postprocedure Evaluation (Signed)
  Anesthesia Post-op Note  Patient: Priscilla Tran  Procedure(s) Performed: Procedure(s) (LRB): LAPAROSCOPY DIAGNOSTIC (N/A) LYSIS OF ADHESION (N/A)  Patient Location: PACU  Anesthesia Type: General  Level of Consciousness: awake and alert   Airway and Oxygen Therapy: Patient Spontanous Breathing  Post-op Pain: mild  Post-op Assessment: Post-op Vital signs reviewed, Patient's Cardiovascular Status Stable, Respiratory Function Stable, Patent Airway and No signs of Nausea or vomiting  Post-op Vital Signs: stable  Complications: No apparent anesthesia complications

## 2011-04-04 NOTE — Transfer of Care (Signed)
Immediate Anesthesia Transfer of Care Note  Patient: Priscilla Tran  Procedure(s) Performed: Procedure(s) (LRB): LAPAROSCOPY DIAGNOSTIC (N/A) LYSIS OF ADHESION (N/A)  Patient Location: PACU  Anesthesia Type: General  Level of Consciousness: awake, alert , oriented and patient cooperative  Airway & Oxygen Therapy: Patient Spontanous Breathing and Patient connected to face mask oxygen  Post-op Assessment: Report given to PACU RN, Post -op Vital signs reviewed and stable and Patient moving all extremities  Post vital signs: Reviewed and stable  Complications: No apparent anesthesia complications

## 2011-04-04 NOTE — Brief Op Note (Signed)
03/31/2011 - 04/04/2011  4:11 PM  PATIENT:  Priscilla Tran  76 y.o. female  Patient Care Team: Jonita Albee, MD as PCP - General (Internal Medicine)  PRE-OPERATIVE DIAGNOSIS:  bowel obstruction  POST-OPERATIVE DIAGNOSIS:  Small bowel obstruction due to pelvic adhesions  PROCEDURE:  Procedure(s) (LRB): LAPAROSCOPY DIAGNOSTIC (N/A) LYSIS OF ADHESION (N/A)  SURGEON:  Surgeon(s) and Role:    * Ardeth Sportsman, MD - Primary  PHYSICIAN ASSISTANT:   ASSISTANTS: none   ANESTHESIA:   local and general  EBL:  Total I/O In: 1000 [I.V.:1000] Out: 750 [Urine:700; Blood:50]  BLOOD ADMINISTERED:none  DRAINS: none   LOCAL MEDICATIONS USED:  BUPIVICAINE   SPECIMEN:  No Specimen  DISPOSITION OF SPECIMEN:  N/A  COUNTS:  YES  TOURNIQUET:  * No tourniquets in log *  DICTATION: .Other Dictation: Dictation Number [id number]  PLAN OF CARE: Admit to inpatient   PATIENT DISPOSITION:  PACU - hemodynamically stable.   Delay start of Pharmacological VTE agent (>24hrs) due to surgical blood loss or risk of bleeding: no

## 2011-04-04 NOTE — Anesthesia Preprocedure Evaluation (Signed)
Anesthesia Evaluation  Patient identified by MRN, date of birth, ID band Patient awake    Reviewed: Allergy & Precautions, H&P , NPO status , Patient's Chart, lab work & pertinent test results  Airway Mallampati: II TM Distance: >3 FB Neck ROM: Full    Dental No notable dental hx.    Pulmonary neg pulmonary ROS,  breath sounds clear to auscultation  Pulmonary exam normal       Cardiovascular hypertension, Pt. on medications Rhythm:Regular Rate:Normal     Neuro/Psych negative neurological ROS  negative psych ROS   GI/Hepatic negative GI ROS, Neg liver ROS,   Endo/Other  Diabetes mellitus-, Type 2, Oral Hypoglycemic AgentsHypothyroidism   Renal/GU negative Renal ROS  negative genitourinary   Musculoskeletal negative musculoskeletal ROS (+)   Abdominal   Peds negative pediatric ROS (+)  Hematology negative hematology ROS (+)   Anesthesia Other Findings   Reproductive/Obstetrics negative OB ROS                           Anesthesia Physical Anesthesia Plan  ASA: III  Anesthesia Plan: General   Post-op Pain Management:    Induction: Intravenous  Airway Management Planned: Oral ETT  Additional Equipment:   Intra-op Plan:   Post-operative Plan: Extubation in OR  Informed Consent: I have reviewed the patients History and Physical, chart, labs and discussed the procedure including the risks, benefits and alternatives for the proposed anesthesia with the patient or authorized representative who has indicated his/her understanding and acceptance.   Dental advisory given  Plan Discussed with: CRNA  Anesthesia Plan Comments:         Anesthesia Quick Evaluation

## 2011-04-04 NOTE — Preoperative (Signed)
Beta Blockers   Reason not to administer Beta Blockers:Not Applicable 

## 2011-04-04 NOTE — Anesthesia Procedure Notes (Signed)
Procedure Name: Intubation Date/Time: 04/04/2011 3:13 PM Performed by: Randon Goldsmith CATHERINE PAYNE Pre-anesthesia Checklist: Patient identified, Emergency Drugs available, Suction available and Patient being monitored Patient Re-evaluated:Patient Re-evaluated prior to inductionOxygen Delivery Method: Circle system utilized Preoxygenation: Pre-oxygenation with 100% oxygen Intubation Type: IV induction, Rapid sequence and Cricoid Pressure applied Laryngoscope Size: Mac and 3 Grade View: Grade I Tube type: Oral Tube size: 7.5 mm Number of attempts: 1 Airway Equipment and Method: Stylet Placement Confirmation: ETT inserted through vocal cords under direct vision,  positive ETCO2,  CO2 detector and breath sounds checked- equal and bilateral Secured at: 20 cm Tube secured with: Tape Dental Injury: Teeth and Oropharynx as per pre-operative assessment

## 2011-04-04 NOTE — Progress Notes (Signed)
Subjective: No improvement.  Requiring pain med every 4 hours, no flatus or BM.  She has not vomited.  Feels no better.  Objective: Vital signs in last 24 hours: Temp:  [98.5 F (36.9 C)-99 F (37.2 C)] 98.5 F (36.9 C) (03/08 0541) Pulse Rate:  [52-64] 63  (03/08 0546) Resp:  [18] 18  (03/08 0541) BP: (140-158)/(69-80) 140/69 mmHg (03/08 0546) SpO2:  [96 %] 96 % (03/08 0541) Last BM Date: 04/02/11  Intake/Output from previous day: 04/19/22 0701 - 03/08 0700 In: 240 [P.O.:240] Out: 1925 [Urine:1925] Intake/Output this shift:    PE:  Alert, taking pain med q4h, Abd: distended, soft, no flatus, no BM  Chest; clear.  Lab Results:   Basename 04/19/11 0525 04/02/11 0530  WBC 4.5 6.1  HGB 12.4 12.9  HCT 38.6 38.5  PLT 249 246    Lab 03/31/11 0745  AST 18  ALT 13  ALKPHOS 69  BILITOT 0.9  PROT 7.4  ALBUMIN 4.0    BMET  Basename 04/04/11 0520 04/19/2011 0525  NA 130* 131*  K 3.3* 3.2*  CL 97 94*  CO2 29 24  GLUCOSE 133* 109*  BUN 6 8  CREATININE 0.56 0.47*  CALCIUM 8.3* 8.8   PT/INR No results found for this basename: LABPROT:2,INR:2 in the last 72 hours   Studies/Results: Dg Abd 2 Views  19-Apr-2011  *RADIOLOGY REPORT*  Clinical Data: Abdominal pain and abdominal distension (slightly decreased today).  ABDOMEN - 2 VIEW  Comparison: Abdominal radiograph dated 04/01/2011.  CT of abdomen and pelvis dated 03/31/2011.  Findings: Nasogastric tube is seen, with the side port projecting over the antral prepyloric region of the stomach, and tip potentially the proximal small bowel.  There is a paucity of colonic gas and stool, with only minimal distal rectal gas. Numerous dilated loops of gas-filled small bowel are again noted, measuring up to 4.1 cm in diameter.  No gross evidence of pneumoperitoneum.  IMPRESSION: 1.  The bowel gas pattern is again consistent with a small bowel obstruction.  Original Report Authenticated By: Florencia Reasons, M.D.     Anti-infectives: Anti-infectives    None     Current Facility-Administered Medications  Medication Dose Route Frequency Provider Last Rate Last Dose  . 0.9 % NaCl with KCl 20 mEq/ L  infusion   Intravenous Continuous Ardeth Sportsman, MD 100 mL/hr at 04/04/11 0520    . acetaminophen (TYLENOL) tablet 650 mg  650 mg Oral Q6H PRN Sherrie George, PA   650 mg at 04-19-11 1200   Or  . acetaminophen (TYLENOL) suppository 650 mg  650 mg Rectal Q6H PRN Sherrie George, PA      . bimatoprost (LUMIGAN) 0.01 % ophthalmic solution 1 drop  1 drop Left Eye QHS Sherrie George, [place]   1 drop at 2011-04-19 2205  . bisacodyl (DULCOLAX) suppository 10 mg  10 mg Rectal Daily Ardeth Sportsman, MD   10 mg at 04/02/11 1014  . brimonidine (ALPHAGAN) 0.2 % ophthalmic solution 1 drop  1 drop Left Eye Daily Sherrie George, [place]   1 drop at 2011/04/19 0800  . diphenhydrAMINE (BENADRYL) injection 12.5 mg  12.5 mg Intravenous Q6H PRN Sherrie George, PA      . dorzolamide-timolol (COSOPT) 22.3-6.8 MG/ML ophthalmic solution 1 drop  1 drop Left Eye BID Sherrie George, [place]   1 drop at April 19, 2011 2204  . heparin injection 5,000 Units  5,000 Units Subcutaneous Q8H Sherrie George, [place]   5,000 Units at 04/04/11 4098  .  HYDROmorphone (DILAUDID) injection 0.5-1 mg  0.5-1 mg Intravenous Q1H PRN Sherrie George, PA   1 mg at 04/04/11 0422  . insulin aspart (novoLOG) injection 0-15 Units  0-15 Units Subcutaneous Q4H Ardeth Sportsman, MD   2 Units at 04/04/11 0422  . levothyroxine (SYNTHROID, LEVOTHROID) injection 76 mcg  76 mcg Intravenous Daily Sherrie George, [place]   76 mcg at 04/03/11 0945  . lip balm (CARMEX) ointment 1 application  1 application Topical BID Ardeth Sportsman, MD   1 application at 04/03/11 2200  . magic mouthwash  15 mL Oral QID PRN Ardeth Sportsman, MD      . menthol-cetylpyridinium (CEPACOL) lozenge 3 mg  1 lozenge Oral PRN Ardeth Sportsman, MD      . metoprolol (LOPRESSOR) injection 10 mg  10 mg Intravenous Q6H  Ardeth Sportsman, MD   10 mg at 04/04/11 4098  . ondansetron (ZOFRAN) injection 4 mg  4 mg Intravenous Q6H PRN Sherrie George, PA   4 mg at 04/03/11 1942  . pantoprazole (PROTONIX) injection 40 mg  40 mg Intravenous QHS Sherrie George, [place]   40 mg at 04/03/11 2201  . phenol (CHLORASEPTIC) mouth spray 2 spray  2 spray Mouth/Throat PRN Ardeth Sportsman, MD        Assessment/Plan 1.SBO -- having pain med every 4 hours, no flatus, still distended. 2. Hypertension  3. 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  6. Hx of chest pain with normal stress test about 4 years ago.  7. K+ 3.3, will replace and check Mg+  Plan:  Discussed exploratory laparotomy for today.  I will replace K+,  She has been NPO since MN.  Risk and Benefits discussed.      LOS: 4 days    Kabir Brannock 04/04/2011

## 2011-04-04 NOTE — Discharge Instructions (Signed)
LAPAROSCOPIC SURGERY: POST OP INSTRUCTIONS  1. DIET: Follow a light bland diet the first 24 hours after arrival home, such as soup, liquids, crackers, etc.  Be sure to include lots of fluids daily.  Avoid fast food or heavy meals as your are more likely to get nauseated.  Eat a low fat the next few days after surgery.   2. Take your usually prescribed home medications unless otherwise directed. 3. PAIN CONTROL: a. Pain is best controlled by a usual combination of three different methods TOGETHER: i. Ice/Heat ii. Over the counter pain medication iii. Prescription pain medication b. Most patients will experience some swelling and bruising around the incisions.  Ice packs or heating pads (30-60 minutes up to 6 times a day) will help. Use ice for the first few days to help decrease swelling and bruising, then switch to heat to help relax tight/sore spots and speed recovery.  Some people prefer to use ice alone, heat alone, alternating between ice & heat.  Experiment to what works for you.  Swelling and bruising can take several weeks to resolve.   c. It is helpful to take an over-the-counter pain medication regularly for the first few weeks.  Choose one of the following that works best for you: i. Naproxen (Aleve, etc)  Two  tabs twice a day ii. Ibuprofen (Advil, etc) Three  tabs four times a day (every meal & bedtime) iii. Acetaminophen (Tylenol, etc) 500-650mg  four times a day (every meal & bedtime) d. A  prescription for pain medication (such as oxycodone, hydrocodone, etc) should be given to you upon discharge.  Take your pain medication as prescribed.  i. If you are having problems/concerns with the prescription medicine (does not control pain, nausea, vomiting, rash, itching, etc), please call us [phone] to see if we need to switch you to a different pain medicine that will work better for you and/or control your side effect better. ii. If you need a refill on your pain medication,  please contact your pharmacy.  They will contact our office to request authorization. Prescriptions will not be filled after 5 pm or on week-ends. 4. Avoid getting constipated.  Between the surgery and the pain medications, it is common to experience some constipation.  Increasing fluid intake and taking a fiber supplement (such as Metamucil, Citrucel, FiberCon, MiraLax, etc) 1-2 times a day regularly will usually help prevent this problem from occurring.  A mild laxative (prune juice, Milk of Magnesia, MiraLax, etc) should be taken according to package directions if there are no bowel movements after 48 hours.   5. Watch out for diarrhea.  If you have many loose bowel movements, simplify your diet to bland foods & liquids for a few days.  Stop any stool softeners and decrease your fiber supplement.  Switching to mild anti-diarrheal medications (Kayopectate, Pepto Bismol) can help.  If this worsens or does not improve, please call us. 6. Wash / shower every day.  You may shower over the dressings as they are waterproof.  Continue to shower over incision(s) after the dressing is off. 7. Remove your waterproof bandages 5 days after surgery.  You may leave the incision open to air.  You may replace a dressing/Band-Aid to cover the incision for comfort if you wish.  8. ACTIVITIES as tolerated:   a. You may resume regular (light) daily activities beginning the next day--such as daily self-care, walking, climbing stairs--gradually increasing activities as tolerated.  If you can walk 30 minutes without difficulty, it  is safe to try more intense activity such as jogging, treadmill, bicycling, low-impact aerobics, swimming, etc. b. Save the most intensive and strenuous activity for last such as sit-ups, heavy lifting, contact sports, etc  Refrain from any heavy lifting or straining until you are off narcotics for pain control.   c. DO NOT PUSH THROUGH PAIN.  Let pain be your guide: If it hurts to do something, don't  do it.  Pain is your body warning you to avoid that activity for another week until the pain goes down. d. You may drive when you are no longer taking prescription pain medication, you can comfortably wear a seatbelt, and you can safely maneuver your car and apply brakes. e. Bonita Quin may have sexual intercourse when it is comfortable.  9. FOLLOW UP in our office a. Please call [facility] at [phone] to set up an appointment to see your surgeon in the office for a follow-up appointment approximately 2-3 weeks after your surgery. b. Make sure that you call for this appointment the day you arrive home to insure a convenient appointment time. 10. IF YOU HAVE DISABILITY OR FAMILY LEAVE FORMS, BRING THEM TO THE OFFICE FOR PROCESSING.  DO NOT GIVE THEM TO YOUR DOCTOR.   WHEN TO CALL us [phone]: 1. Poor pain control 2. Reactions / problems with new medications (rash/itching, nausea, etc)  3. Fever over 101.5 F (38.5 C) 4. Inability to urinate 5. Nausea and/or vomiting 6. Worsening swelling or bruising 7. Continued bleeding from incision. 8. Increased pain, redness, or drainage from the incision   The clinic staff is available to answer your questions during regular business hours (8:30am-5pm).  Please don't hesitate to call and ask to speak to one of our nurses for clinical concerns.   If you have a medical emergency, go to the nearest emergency room or call 911.  A surgeon from [facility] is always on call at the [facility], [place] [address], [place], [place]  [address] ? MAIN: [phone]) [phone] ? TOLL FREE: [phone] ?  FAX [phone] [email or web address]   Small Bowel Obstruction A small bowel obstruction is a blockage (obstruction) of the small intestine (small bowel). The small bowel is a long, slender tube that connects the stomach to the colon. Its job is to absorb nutrients from the fluids and foods you consume  into the bloodstream.  CAUSES  There are many causes of intestinal blockage. The most common ones include:  Hernias. This is a more common cause in children than adults.   Inflammatory bowel disease (enteritis and colitis).   Twisting of the bowel (volvulus).   Tumors.   Scar tissue (adhesions) from previous surgery or radiation treatment.   Recent surgery. This may cause an acute small bowel obstruction called an ileus.  SYMPTOMS   Abdominal pain. This may be dull cramps or sharp pain. It may occur in one area or may be present in the entire abdomen. Pain can range from mild to severe, depending on the degree of obstruction.   Nausea and vomiting. Vomit may be greenish or yellow bile color.   Distended or swollen stomach. Abdominal bloating is a common symptom.   Constipation.   Lack of passing gas.   Frequent belching.   Diarrhea. This may occur if runny stool is able to leak around the obstruction.  DIAGNOSIS  Your caregiver can usually diagnose small bowel obstruction by taking a history, doing a physical exam,  and taking X-rays. If the cause is unclear, a CT scan (computerized tomography) of your abdomen and pelvis may be needed. TREATMENT  Treatment of the blockage depends on the cause and how bad the problem is.   Sometimes, the obstruction improves with bed rest and intravenous (IV) fluids.   Resting the bowel is very important. This means following a simple diet. Sometimes, a clear liquid diet may be required for several days.   Sometimes, a small tube (nasogastric tube) is placed into the stomach to decompress the bowel. When the bowel is blocked, it usually swells up like a balloon filled with air and fluids. Decompression means that the air and fluids are removed by suction through that tube. This can help with pain, discomfort, and nausea. It can also help the obstruction resolve faster.   Surgery may be required if other treatments do not work. Bowel obstruction  from a hernia may require early surgery and can be an emergency procedure. Adhesions that cause frequent or severe obstructions may also require surgery.  HOME CARE INSTRUCTIONS If your bowel obstruction is only partial or incomplete, you may be allowed to go home.  Get plenty of rest.   Follow your diet as directed by your caregiver.   Only consume clear liquids until your condition improves.   Avoid solid foods as instructed.  SEEK IMMEDIATE MEDICAL CARE IF:  You have increased pain or cramping.   You vomit blood.   You have uncontrolled vomiting or nausea.   You cannot drink fluids due to vomiting or pain.   You develop confusion.   You begin feeling very dry or thirsty (dehydrated).   You have severe bloating.   You have chills.   You have a fever.   You feel extremely weak or you faint.  MAKE SURE YOU:  Understand these instructions.   Will watch your condition.   Will get help right away if you are not doing well or get worse.  Document Released: 04/01/2005 Document Revised: 01/02/2011 Document Reviewed: 03/29/2010 [facility] Patient Information 2012 [place], [place].

## 2011-04-05 LAB — GLUCOSE, CAPILLARY
Glucose-Capillary: 133 mg/dL — ABNORMAL HIGH (ref 70–99)
Glucose-Capillary: 113 mg/dL — ABNORMAL HIGH (ref 70–99)
Glucose-Capillary: 132 mg/dL — ABNORMAL HIGH (ref 70–99)

## 2011-04-05 NOTE — Progress Notes (Signed)
1 Day Post-Op  Subjective: Pain controlled. Says she got clears last night after surgery??  Ng tube had about 200cc. Reports some flatus  Objective: Vital signs in last 24 hours: Temp:  [97.7 F (36.5 C)-98.8 F (37.1 C)] 98.2 F (36.8 C) (03/09 0600) Pulse Rate:  [57-78] 78  (03/09 0600) Resp:  [14-18] 18  (03/09 0600) BP: (124-188)/(65-88) 157/80 mmHg (03/09 0600) SpO2:  [93 %-99 %] 97 % (03/09 0600) Last BM Date: 03/30/11  Intake/Output from previous day: 03/08 0701 - 03/09 0700 In: 1600 [I.V.:1600] Out: 2975 [Urine:2925; Blood:50] Intake/Output this shift:    Alert, nad cta ant Reg Soft, incision c/d/i. Min TTP. Mild distension  Lab Results:   Basename 04/03/11 0525  WBC 4.5  HGB 12.4  HCT 38.6  PLT 249   BMET  Basename 04/04/11 0520 04/03/11 0525  NA 130* 131*  K 3.3* 3.2*  CL 97 94*  CO2 29 24  GLUCOSE 133* 109*  BUN 6 8  CREATININE 0.56 0.47*  CALCIUM 8.3* 8.8   PT/INR No results found for this basename: LABPROT:2,INR:2 in the last 72 hours ABG No results found for this basename: PHART:2,PCO2:2,PO2:2,HCO3:2 in the last 72 hours  Studies/Results: No results found.  Anti-infectives: Anti-infectives     Start     Dose/Rate Route Frequency Ordered Stop   04/04/11 0900   ertapenem (INVANZ) 1 g in sodium chloride 0.9 % 50 mL IVPB  Status:  Discontinued        1 g 100 mL/hr over 30 Minutes Intravenous Every 24 hours 04/04/11 0813 04/05/11 1121          Assessment/Plan: s/p Procedure(s) (LRB): LAPAROSCOPY DIAGNOSTIC (N/A) LYSIS OF ADHESION (N/A)  D/c foley D/c abx Can have ice chips/water - not clears Cont ng tube today Oob, pulm toilet  Mary Sella. Andrey Campanile, MD, FACS General, Bariatric, & Minimally Invasive Surgery [facility], [place]    LOS: 5 days    Priscilla Tran 04/05/2011

## 2011-04-06 LAB — BASIC METABOLIC PANEL
BUN: 7 mg/dL (ref 6–23)
Creatinine, Ser: 0.55 mg/dL (ref 0.50–1.10)
GFR calc Af Amer: >90 mL/min (ref >90)
GFR calc non Af Amer: >90 mL/min (ref >90)

## 2011-04-06 LAB — GLUCOSE, CAPILLARY
Glucose-Capillary: 104 mg/dL — ABNORMAL HIGH (ref 70–99)
Glucose-Capillary: 181 mg/dL — ABNORMAL HIGH (ref 70–99)

## 2011-04-06 LAB — CBC
MCHC: 33.8 g/dL (ref 30.0–36.0)
RDW: 16.4 % — ABNORMAL HIGH (ref 11.5–15.5)

## 2011-04-06 MED ORDER — POTASSIUM CHLORIDE IN NACL 20-0.9 MEQ/L-% IV SOLN
INTRAVENOUS | Status: DC
  Administered 2011-04-06 – 2011-04-09 (×4): via INTRAVENOUS
  Filled 2011-04-06 (×9): qty 1000

## 2011-04-06 NOTE — Progress Notes (Signed)
Patient ID: Priscilla Tran, female   DOB: 20-Aug-1935, 76 y.o.   MRN: [id number] [facility] Progress Note:   2 Days Post-Op  Subjective: Mental status is clear.  Passed gas.  Will discontinue NG and start clear liquids Objective: Vital signs in last 24 hours: Temp:  [97.4 F (36.3 C)-98.3 F (36.8 C)] 98.1 F (36.7 C) (03/10 4098) Pulse Rate:  [65-70] 65  (03/10 0633) Resp:  [18-20] 20  (03/10 0633) BP: (136-167)/(73-78) 167/78 mmHg (03/10 0633) SpO2:  [93 %-94 %] 93 % (03/10 0633)  Intake/Output from previous day: 03/09 0701 - 03/10 0700 In: 1510 [I.V.:1500; IV Piggyback:10] Out: 600 [Urine:400; Emesis/NG output:200] Intake/Output this shift: Total I/O In: 0  Out: 300 [Urine:300]  Physical Exam: Work of breathing is  Not labored.  Lab Results:  Results for orders placed during the hospital encounter of 03/31/11 (from the past 48 hour(s))  GLUCOSE, CAPILLARY     Status: Abnormal   Collection Time   04/04/11  2:58 PM      Component Value Range Comment   Glucose-Capillary 134 (*) 70 - 99 (mg/dL)   GLUCOSE, CAPILLARY     Status: Abnormal   Collection Time   04/04/11  4:33 PM      Component Value Range Comment   Glucose-Capillary 138 (*) 70 - 99 (mg/dL)    Comment 1 Documented in Chart      Comment 2 Notify RN     GLUCOSE, CAPILLARY     Status: Abnormal   Collection Time   04/05/11 12:05 AM      Component Value Range Comment   Glucose-Capillary 168 (*) 70 - 99 (mg/dL)    Comment 1 Notify RN     GLUCOSE, CAPILLARY     Status: Abnormal   Collection Time   04/05/11  4:13 AM      Component Value Range Comment   Glucose-Capillary 125 (*) 70 - 99 (mg/dL)   GLUCOSE, CAPILLARY     Status: Abnormal   Collection Time   04/05/11  5:56 AM      Component Value Range Comment   Glucose-Capillary 115 (*) 70 - 99 (mg/dL)    Comment 1 Notify RN     GLUCOSE, CAPILLARY     Status: Abnormal   Collection Time   04/05/11  8:17 AM      Component Value Range Comment   Glucose-Capillary  133 (*) 70 - 99 (mg/dL)    Comment 1 Notify RN     GLUCOSE, CAPILLARY     Status: Abnormal   Collection Time   04/05/11 12:05 PM      Component Value Range Comment   Glucose-Capillary 112 (*) 70 - 99 (mg/dL)    Comment 1 Notify RN     GLUCOSE, CAPILLARY     Status: Abnormal   Collection Time   04/05/11  4:37 PM      Component Value Range Comment   Glucose-Capillary 113 (*) 70 - 99 (mg/dL)    Comment 1 Notify RN     GLUCOSE, CAPILLARY     Status: Abnormal   Collection Time   04/05/11  8:10 PM      Component Value Range Comment   Glucose-Capillary 132 (*) 70 - 99 (mg/dL)   GLUCOSE, CAPILLARY     Status: Abnormal   Collection Time   04/06/11 12:14 AM      Component Value Range Comment   Glucose-Capillary 104 (*) 70 - 99 (mg/dL)   GLUCOSE, CAPILLARY  Status: Abnormal   Collection Time   04/06/11  4:40 AM      Component Value Range Comment   Glucose-Capillary 110 (*) 70 - 99 (mg/dL)   BASIC METABOLIC PANEL     Status: Abnormal   Collection Time   04/06/11  5:45 AM      Component Value Range Comment   Sodium 137  135 - 145 (mEq/L)    Potassium 2.9 (*) 3.5 - 5.1 (mEq/L)    Chloride 97  96 - 112 (mEq/L)    CO2 30  19 - 32 (mEq/L)    Glucose, Bld 110 (*) 70 - 99 (mg/dL)    BUN 7  6 - 23 (mg/dL)    Creatinine, Ser 1.91  0.50 - 1.10 (mg/dL)    Calcium 8.6  8.4 - 10.5 (mg/dL)    GFR calc non Af Amer >90  >90 (mL/min)    GFR calc Af Amer >90  >90 (mL/min)   CBC     Status: Abnormal   Collection Time   04/06/11  5:45 AM      Component Value Range Comment   WBC 6.9  4.0 - 10.5 (K/uL)    RBC 4.65  3.87 - 5.11 (MIL/uL)    Hemoglobin 12.5  12.0 - 15.0 (g/dL)    HCT 47.8  29.5 - 62.1 (%)    MCV 79.6  78.0 - 100.0 (fL)    MCH 26.9  26.0 - 34.0 (pg)    MCHC 33.8  30.0 - 36.0 (g/dL)    RDW 30.8 (*) 65.7 - 15.5 (%)    Platelets 247  150 - 400 (K/uL)   GLUCOSE, CAPILLARY     Status: Abnormal   Collection Time   04/06/11  8:10 AM      Component Value Range Comment   Glucose-Capillary 108  (*) 70 - 99 (mg/dL)     Radiology/Results: No results found.  Anti-infectives: Anti-infectives     Start     Dose/Rate Route Frequency Ordered Stop   04/04/11 0900   ertapenem (INVANZ) 1 g in sodium chloride 0.9 % 50 mL IVPB  Status:  Discontinued        1 g 100 mL/hr over 30 Minutes Intravenous Every 24 hours 04/04/11 0813 04/05/11 1121          Assessment/Plan: Problem List: Patient Active Problem List  Diagnoses  . Hypothyroid  . Glaucoma associated with ocular inflammation, severe stage  . Disc degeneration, lumbar  . SBO (small bowel obstruction)  . Hypertension  . Diabetes type 2, controlled    Doing well as enterolysis.  Passing flatus.  Hungry.  Discontinue NG and start clear liquids.  2 Days Post-Op    LOS: 6 days   Matt B. Daphine Deutscher, MD, [facility] [phone] beeper [phone]  04/06/2011 9:52 AM

## 2011-04-06 NOTE — Progress Notes (Signed)
Pt. 's  NG tube removed and pt. Started on clear liquids, tol. Well. OOB ambulating in the hall with family x2, tol. Well. Pt states she had a moderate green loose BM.Marland Kitchen KCL-2.9 Dr. Daphine Deutscher called, orders given. Four abd dsg. D/i. Pt. Ambulating to the bathroom,does well.

## 2011-04-07 LAB — COMPREHENSIVE METABOLIC PANEL
ALT: 15 U/L (ref 0–35)
AST: 21 U/L (ref 0–37)
Albumin: 3.2 g/dL — ABNORMAL LOW (ref 3.5–5.2)
Alkaline Phosphatase: 56 U/L (ref 39–117)
Chloride: 96 mEq/L (ref 96–112)
Potassium: 2.9 mEq/L — ABNORMAL LOW (ref 3.5–5.1)
Sodium: 137 mEq/L (ref 135–145)
Total Bilirubin: 1 mg/dL (ref 0.3–1.2)

## 2011-04-07 LAB — GLUCOSE, CAPILLARY
Glucose-Capillary: 153 mg/dL — ABNORMAL HIGH (ref 70–99)
Glucose-Capillary: 174 mg/dL — ABNORMAL HIGH (ref 70–99)

## 2011-04-07 LAB — CBC
Platelets: 272 10*3/uL (ref 150–400)
RDW: 16 % — ABNORMAL HIGH (ref 11.5–15.5)
WBC: 8.8 10*3/uL (ref 4.0–10.5)

## 2011-04-07 MED ORDER — INSULIN ASPART 100 UNIT/ML ~~LOC~~ SOLN
0.0000 [IU] | Freq: Three times a day (TID) | SUBCUTANEOUS | Status: DC
  Administered 2011-04-07 (×4): 3 [IU] via SUBCUTANEOUS
  Administered 2011-04-08: 5 [IU] via SUBCUTANEOUS
  Administered 2011-04-08 (×3): 3 [IU] via SUBCUTANEOUS
  Filled 2011-04-07: qty 3

## 2011-04-07 NOTE — Progress Notes (Signed)
3 Days Post-Op  Subjective: Feels good, + flatus, +BM yesterday.  Doing well on clears.  Objective: Vital signs in last 24 hours: Temp:  [98 F (36.7 C)-99 F (37.2 C)] 98.8 F (37.1 C) (03/11 0536) Pulse Rate:  [56-87] 87  (03/11 0703) Resp:  [18-20] 18  (03/11 0536) BP: (134-164)/(63-94) 152/63 mmHg (03/11 1228) SpO2:  [90 %-98 %] 90 % (03/11 0536) Last BM Date: 04/06/11  Intake/Output from previous day: 03/10 0701 - 03/11 0700 In: 2145 [P.O.:1020; I.V.:1125] Out: 2800 [Urine:2600; Emesis/NG output:200] Intake/Output this shift: Total I/O In: 600 [P.O.:600] Out: 200 [Urine:200]  PE:  Alert, Chest: clear  Abd:  Dressing sites look good, +BS, non tender, not distended.  Lab Results:   [facility] 04/06/11 0545  WBC 6.9  HGB 12.5  HCT 37.0  PLT 247   No results found for this basename: AST:5,ALT:5,ALKPHOS:5,BILITOT:5,PROT:5,ALBUMIN:5 in the last 168 hours  BMET  [facility] 04/06/11 0545  NA 137  K 2.9*  CL 97  CO2 30  GLUCOSE 110*  BUN 7  CREATININE 0.55  CALCIUM 8.6   PT/INR No results found for this basename: LABPROT:2,INR:2 in the last 72 hours   Studies/Results: No results found.  Anti-infectives: Anti-infectives     Start     Dose/Rate Route Frequency Ordered Stop   04/04/11 0900   ertapenem (INVANZ) 1 g in sodium chloride 0.9 % 50 mL IVPB  Status:  Discontinued        1 g 100 mL/hr over 30 Minutes Intravenous Every 24 hours 04/04/11 0813 04/05/11 1121         Current Facility-Administered Medications  Medication Dose Route Frequency Provider Last Rate Last Dose  . 0.9 % NaCl with KCl 20 mEq/ L  infusion   Intravenous Continuous Valarie Merino, MD 125 mL/hr at 04/07/11 0945    . acetaminophen (TYLENOL) tablet 650 mg  650 mg Oral Q6H PRN Sherrie George, PA   650 mg at 04/03/11 1200   Or  . acetaminophen (TYLENOL) suppository 650 mg  650 mg Rectal Q6H PRN Sherrie George, PA      . bimatoprost (LUMIGAN) 0.01 % ophthalmic solution 1 drop  1  drop Left Eye QHS Sherrie George, PA   1 drop at 04/06/11 2200  . bisacodyl (DULCOLAX) suppository 10 mg  10 mg Rectal Daily Ardeth Sportsman, MD   10 mg at 04/05/11 9562  . bisacodyl (DULCOLAX) suppository 10 mg  10 mg Rectal Q12H PRN Ardeth Sportsman, MD      . brimonidine (ALPHAGAN) 0.2 % ophthalmic solution 1 drop  1 drop Left Eye Daily Sherrie George, [place]   1 drop at 04/07/11 1000  . diphenhydrAMINE (BENADRYL) injection 12.5 mg  12.5 mg Intravenous Q6H PRN Sherrie George, PA      . dorzolamide-timolol (COSOPT) 22.3-6.8 MG/ML ophthalmic solution 1 drop  1 drop Left Eye BID Sherrie George, PA   1 drop at 04/07/11 1000  . heparin injection 5,000 Units  5,000 Units Subcutaneous Q8H Sherrie George, [place]   5,000 Units at 04/07/11 [phone]  . HYDROmorphone (DILAUDID) injection 0.5-2 mg  0.5-2 mg Intravenous Q30 min PRN Reece Packer, PHARMD   1 mg at 04/06/11 2101  . insulin aspart (novoLOG) injection 0-15 Units  0-15 Units Subcutaneous TID AC & HS Ardeth Sportsman, MD   3 Units at 04/07/11 1222  . ketorolac (TORADOL) 15 MG/ML injection 15-30 mg  15-30 mg Intravenous Q6H PRN Ardeth Sportsman, MD   15 mg  at 04/07/11 1109  . levothyroxine (SYNTHROID, LEVOTHROID) injection 76 mcg  76 mcg Intravenous Daily Sherrie George, [place]   76 mcg at 04/07/11 0943  . lip balm (CARMEX) ointment 1 application  1 application Topical BID Ardeth Sportsman, MD   1 application at 04/06/11 2200  . magic mouthwash  15 mL Oral QID PRN Ardeth Sportsman, MD      . menthol-cetylpyridinium (CEPACOL) lozenge 3 mg  1 lozenge Oral PRN Ardeth Sportsman, MD   3 mg at 04/05/11 0423  . metoprolol (LOPRESSOR) injection 10 mg  10 mg Intravenous Q6H Ardeth Sportsman, MD   10 mg at 04/07/11 1222  . ondansetron (ZOFRAN) injection 4 mg  4 mg Intravenous Q6H PRN Sherrie George, PA   4 mg at 04/05/11 1741  . pantoprazole (PROTONIX) injection 40 mg  40 mg Intravenous QHS Sherrie George, [place]   40 mg at 04/06/11 2101  . phenol  (CHLORASEPTIC) mouth spray 2 spray  2 spray Mouth/Throat PRN Ardeth Sportsman, MD      . DISCONTD: 0.9 %  sodium chloride infusion   Intravenous Continuous Sherrie George, PA 125 mL/hr at 04/06/11 1600    . 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 Hypertension  3. 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  6. Hx of chest pain with normal stress test about 4 years ago.    Plan:  Go to full liquids, decrease IV,  Home when she can tolerate food and has another BM, recheck labs last one K+, 3.9      LOS: 7 days    Priscilla Tran 04/07/2011

## 2011-04-07 NOTE — Progress Notes (Signed)
Patient seen and examined.  Agree with PA's note.  Bowel function is returning.

## 2011-04-08 LAB — GLUCOSE, CAPILLARY
Glucose-Capillary: 182 mg/dL — ABNORMAL HIGH (ref 70–99)
Glucose-Capillary: 193 mg/dL — ABNORMAL HIGH (ref 70–99)

## 2011-04-08 MED ORDER — MAGNESIUM SULFATE 40 MG/ML IJ SOLN
2.0000 g | Freq: Once | INTRAMUSCULAR | Status: AC
  Administered 2011-04-08: 2 g via INTRAVENOUS
  Filled 2011-04-08: qty 50

## 2011-04-08 MED ORDER — AMLODIPINE BESY-BENAZEPRIL HCL 10-20 MG PO CAPS: 1.0000 | ORAL_CAPSULE | Freq: Every day | ORAL | Status: DC

## 2011-04-08 MED ORDER — SIMVASTATIN 10 MG PO TABS
10.0000 mg | ORAL_TABLET | Freq: Every day | ORAL | Status: DC
  Administered 2011-04-08: 10 mg via ORAL
  Filled 2011-04-08 (×2): qty 1

## 2011-04-08 MED ORDER — BENAZEPRIL HCL 20 MG PO TABS
20.0000 mg | ORAL_TABLET | Freq: Every day | ORAL | Status: DC
  Administered 2011-04-08: 20 mg via ORAL
  Filled 2011-04-08 (×2): qty 1

## 2011-04-08 MED ORDER — POTASSIUM CHLORIDE CRYS ER 20 MEQ PO TBCR
30.0000 meq | EXTENDED_RELEASE_TABLET | Freq: Three times a day (TID) | ORAL | Status: DC
  Administered 2011-04-08 (×3): 30 meq via ORAL
  Filled 2011-04-08 (×6): qty 1

## 2011-04-08 MED ORDER — LEVOTHYROXINE SODIUM 150 MCG PO TABS
150.0000 ug | ORAL_TABLET | Freq: Every day | ORAL | Status: DC
  Administered 2011-04-08: 150 ug via ORAL
  Filled 2011-04-08 (×2): qty 1

## 2011-04-08 MED ORDER — POLYETHYLENE GLYCOL 3350 17 G PO PACK
17.0000 g | PACK | Freq: Every day | ORAL | Status: DC
  Administered 2011-04-08: 17 g via ORAL
  Filled 2011-04-08 (×2): qty 1

## 2011-04-08 MED ORDER — ASPIRIN EC 81 MG PO TBEC
81.0000 mg | DELAYED_RELEASE_TABLET | Freq: Every day | ORAL | Status: DC
  Administered 2011-04-08: 81 mg via ORAL
  Filled 2011-04-08 (×2): qty 1

## 2011-04-08 MED ORDER — OXYCODONE-ACETAMINOPHEN 5-325 MG PO TABS: 1.0000 | ORAL_TABLET | ORAL | Status: DC | PRN

## 2011-04-08 MED ORDER — ACETAMINOPHEN 325 MG PO TABS: 650.0000 mg | ORAL_TABLET | Freq: Four times a day (QID) | ORAL | Status: DC | PRN

## 2011-04-08 MED ORDER — AMLODIPINE BESYLATE 10 MG PO TABS
10.0000 mg | ORAL_TABLET | Freq: Every day | ORAL | Status: DC
  Administered 2011-04-08: 10 mg via ORAL
  Filled 2011-04-08 (×2): qty 1

## 2011-04-08 MED ORDER — OYSTER CALCIUM 500 MG PO TABS
500.0000 mg | ORAL_TABLET | Freq: Every day | ORAL | Status: DC
  Administered 2011-04-08: 500 mg via ORAL
  Filled 2011-04-08 (×2): qty 1

## 2011-04-08 MED ORDER — METFORMIN HCL 500 MG PO TABS
1000.0000 mg | ORAL_TABLET | Freq: Two times a day (BID) | ORAL | Status: DC
  Administered 2011-04-08 (×2): 1000 mg via ORAL
  Filled 2011-04-08 (×4): qty 2

## 2011-04-08 MED ORDER — CARVEDILOL 6.25 MG PO TABS
6.2500 mg | ORAL_TABLET | Freq: Four times a day (QID) | ORAL | Status: DC
  Administered 2011-04-08 (×4): 6.25 mg via ORAL
  Filled 2011-04-08 (×8): qty 1

## 2011-04-08 MED ORDER — CALCIUM CARBONATE 600 MG PO TABS
600.0000 mg | ORAL_TABLET | Freq: Every day | ORAL | Status: DC
  Filled 2011-04-08: qty 1

## 2011-04-08 NOTE — Discharge Summary (Signed)
Physician Discharge Summary  Patient ID: Priscilla Tran MRN: [id number] DOB/AGE: 09-14-1935 76 y.o.  Admit date: 03/31/2011 Discharge date: 04/09/2011  Admission Diagnoses:  1.SBO, no prior history of abdominal surgery; concern by CT of internal hernia/mesenteric edema with early ischemia. Lactic acid normal at 1.5  2. Hypertension  3. 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  6. Hx of chest pain with normal stress test about 4 years ago.      Discharge Diagnoses: Small bowel obstruction secondary to pelvic  adhesions.  Principal Problem:  *SBO (small bowel obstruction) Active Problems:  Hypothyroid  Hypertension  Diabetes type 2, controlled   PROCEDURES:Laparoscopic lysis of adhesions. 04/04/11 Dr. Chelsea Aus Course: *Patient is a 76 year old female who was doing well until Saturday evening 03/29/11. She came home from church and noticed her stomach was bloated and thought it was gas. Sunday sometime after lunch she developed abdominal pain more not in vomiting. She presented to primary care where a three-way film was unremarkable. White count was 10,600 and was her opinion she probably had gastroenteritis. She had some ongoing abdominal discomfort nausea and vomiting since discharge. She is back with new CT findings listed below. ? Of internal hernia, And ischemia. NG placed with 600 ml clear what looks like contrast media.  She was admitted placed on NG suction, bowel rest and hydration.  We thought she was progressing, but failed after NG removal and clear liquids. She was taken to the OR the following day, for the above noted procedure.  Post op she has done well her diet has been advanced, her home medicines have been restarted, and if she does well we hope to send her home in AM 04/05/11.  She had another BM yesterday, labs are back to normal, BP well controlled. Glucose is improving.   Follow up:  2 weeks with DR. Gross Follow up with Family practice  in 2 Weeks., Call them for issueswith high blood pressure or glucose control Condition on D/C:  Improved.  Disposition:    Medication List  As of 04/09/2011  7:46 AM   STOP taking these medications         ondansetron 4 MG disintegrating tablet         TAKE these medications         acetaminophen 325 MG tablet   Commonly known as: TYLENOL   Take 650 mg by mouth every 6 (six) hours as needed. For pain      amLODipine-benazepril 10-20 MG per capsule   Commonly known as: LOTREL   Take 1 capsule by mouth daily.      aspirin EC 81 MG tablet   Take 81 mg by mouth daily.      bimatoprost 0.03 % ophthalmic solution   Commonly known as: LUMIGAN   Place 1 drop into the left eye at bedtime.      bismuth subsalicylate 262 MG/15ML suspension   Commonly known as: PEPTO BISMOL   Take 15 mLs by mouth every 6 (six) hours as needed. For upset stomach      brimonidine 0.2 % ophthalmic solution   Commonly known as: ALPHAGAN   Place 1 drop into the left eye 2 (two) times daily.      calcium carbonate 600 MG Tabs   Commonly known as: OS-CAL   Take 600 mg by mouth daily.      carvedilol 12.5 MG tablet   Commonly known as: COREG   Take 12.5  mg by mouth 2 (two) times daily with a meal.      dorzolamide-timolol 22.3-6.8 MG/ML ophthalmic solution   Commonly known as: COSOPT   Place 1 drop into both eyes 2 (two) times daily.      levothyroxine 150 MCG tablet   Commonly known as: SYNTHROID, LEVOTHROID   Take 150 mcg by mouth daily.      lovastatin 10 MG tablet   Commonly known as: MEVACOR   Take 10 mg by mouth at bedtime.      metFORMIN 1000 MG tablet   Commonly known as: GLUCOPHAGE   Take 1,000 mg by mouth 2 (two) times daily with a meal.      oxyCODONE-acetaminophen 5-325 MG per tablet   Commonly known as: PERCOCET   Take 1-2 tablets by mouth every 4 (four) hours as needed.      polyethylene glycol packet   Commonly known as: MIRALAX / GLYCOLAX   Take 17 g by mouth daily.             Follow-up Information    Follow up with GROSS,STEVEN C., MD in 2 weeks.   Contact information:   [employer] [address] [place] [address] [phone]       Follow up with GUEST, Loretha Stapler, MD. (Call your family doctor for problems with your blood pressure or glucose.)    Contact information:   [address] [place] [address] [phone]          Signed: Sherrie George 04/09/2011, 7:46 AM

## 2011-04-08 NOTE — Progress Notes (Signed)
Patient seen and examined.  Agree with PA's note.  

## 2011-04-08 NOTE — Progress Notes (Signed)
4 Days Post-Op  Subjective: TM 99  BP up some 148/87, No BM recorded, Labs show K+ still low.  Feels good, to much sugar on tray. No BM yesterday, lots of gas.  Objective: Vital signs in last 24 hours: Temp:  [98.4 F (36.9 C)-99 F (37.2 C)] 98.4 F (36.9 C) (03/12 0545) Pulse Rate:  [62-120] 73  (03/12 0549) Resp:  [18-20] 18  (03/12 0545) BP: (145-167)/(63-105) 148/87 mmHg (03/12 0549) SpO2:  [92 %-98 %] 94 % (03/12 0549) Last BM Date: 04/06/11  Intake/Output from previous day: 03/11 0701 - 03/12 0700 In: 2400 [P.O.:1200; I.V.:1200] Out: 1600 [Urine:1600] Intake/Output this shift:    PE:  Alert sitting up and eating breakfast.  Chest:  Clear.  Abd:  Soft, still "puffy."  Nontender, not distended. +BS  Lab Results:   [facility] 04/07/11 1339 04/06/11 0545  WBC 8.8 6.9  HGB 11.9* 12.5  HCT 34.3* 37.0  PLT 272 247    Lab 04/07/11 1339  AST 21  ALT 15  ALKPHOS 56  BILITOT 1.0  PROT 6.2  ALBUMIN 3.2*    BMET  Basename 04/07/11 1339 04/06/11 0545  NA 137 137  K 2.9* 2.9*  CL 96 97  CO2 33* 30  GLUCOSE 176* 110*  BUN <3* 7  CREATININE 0.44* 0.55  CALCIUM 8.4 8.6   PT/INR No results found for this basename: LABPROT:2,INR:2 in the last 72 hours   Studies/Results: No results found.  Anti-infectives: Anti-infectives     Start     Dose/Rate Route Frequency Ordered Stop   04/04/11 0900   ertapenem (INVANZ) 1 g in sodium chloride 0.9 % 50 mL IVPB  Status:  Discontinued        1 g 100 mL/hr over 30 Minutes Intravenous Every 24 hours 04/04/11 0813 04/05/11 1121         Current Facility-Administered Medications  Medication Dose Route Frequency Provider Last Rate Last Dose  . 0.9 % NaCl with KCl 20 mEq/ L  infusion   Intravenous Continuous Sherrie George, PA 75 mL/hr at 04/07/11 1353    . acetaminophen (TYLENOL) tablet 650 mg  650 mg Oral Q6H PRN Sherrie George, PA   650 mg at 04/03/11 1200   Or  . acetaminophen (TYLENOL) suppository 650 mg  650 mg  Rectal Q6H PRN Sherrie George, PA      . bimatoprost (LUMIGAN) 0.01 % ophthalmic solution 1 drop  1 drop Left Eye QHS Sherrie George, [place]   1 drop at 04/07/11 2230  . bisacodyl (DULCOLAX) suppository 10 mg  10 mg Rectal Daily Ardeth Sportsman, MD   10 mg at 04/05/11 6295  . bisacodyl (DULCOLAX) suppository 10 mg  10 mg Rectal Q12H PRN Ardeth Sportsman, MD      . brimonidine (ALPHAGAN) 0.2 % ophthalmic solution 1 drop  1 drop Left Eye Daily Sherrie George, [place]   1 drop at 04/07/11 1000  . diphenhydrAMINE (BENADRYL) injection 12.5 mg  12.5 mg Intravenous Q6H PRN Sherrie George, PA      . dorzolamide-timolol (COSOPT) 22.3-6.8 MG/ML ophthalmic solution 1 drop  1 drop Left Eye BID Sherrie George, [place]   1 drop at 04/07/11 2230  . heparin injection 5,000 Units  5,000 Units Subcutaneous Q8H Sherrie George, [place]   5,000 Units at 04/08/11 0549  . HYDROmorphone (DILAUDID) injection 0.5-2 mg  0.5-2 mg Intravenous Q30 min PRN Reece Packer, PHARMD   1 mg at 04/08/11 0422  . insulin aspart (novoLOG) injection 0-15  Units  0-15 Units Subcutaneous TID AC & HS Ardeth Sportsman, MD   3 Units at 04/07/11 2213  . ketorolac (TORADOL) 15 MG/ML injection 15-30 mg  15-30 mg Intravenous Q6H PRN Ardeth Sportsman, MD   15 mg at 04/07/11 1109  . levothyroxine (SYNTHROID, LEVOTHROID) injection 76 mcg  76 mcg Intravenous Daily Sherrie George, [place]   76 mcg at 04/07/11 0943  . lip balm (CARMEX) ointment 1 application  1 application Topical BID Ardeth Sportsman, MD   1 application at 04/07/11 2213  . magic mouthwash  15 mL Oral QID PRN Ardeth Sportsman, MD      . menthol-cetylpyridinium (CEPACOL) lozenge 3 mg  1 lozenge Oral PRN Ardeth Sportsman, MD   3 mg at 04/05/11 0423  . metoprolol (LOPRESSOR) injection 10 mg  10 mg Intravenous Q6H Ardeth Sportsman, MD   10 mg at 04/08/11 0550  . ondansetron (ZOFRAN) injection 4 mg  4 mg Intravenous Q6H PRN Sherrie George, PA   4 mg at 04/05/11 1741  . pantoprazole (PROTONIX)  injection 40 mg  40 mg Intravenous QHS Sherrie George, [place]   40 mg at 04/07/11 2211  . phenol (CHLORASEPTIC) mouth spray 2 spray  2 spray Mouth/Throat PRN Ardeth Sportsman, MD        Assessment/Plan 1. Small bowel obstruction due to pelvic adhesions S/p laparotomy with lysis of adhesions 04/04/11  2. Hypertension  3. 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  6. Hx of chest pain with normal stress test about 4 years ago.   Plan:  Advance to Carb modified diet, restart home meds, miralax, replace K+.  If she does well hope to send home tomorrow.    LOS: 8 days    Priscilla Tran 04/08/2011

## 2011-04-09 LAB — BASIC METABOLIC PANEL
BUN: 3 mg/dL — ABNORMAL LOW (ref 6–23)
CO2: 30 mEq/L (ref 19–32)
Chloride: 102 mEq/L (ref 96–112)
Glucose, Bld: 166 mg/dL — ABNORMAL HIGH (ref 70–99)
Potassium: 4.3 mEq/L (ref 3.5–5.1)
Sodium: 136 mEq/L (ref 135–145)

## 2011-04-09 LAB — GLUCOSE, CAPILLARY
Glucose-Capillary: 182 mg/dL — ABNORMAL HIGH (ref 70–99)
Glucose-Capillary: 157 mg/dL — ABNORMAL HIGH (ref 70–99)

## 2011-04-09 MED ORDER — POLYETHYLENE GLYCOL 3350 17 G PO PACK: 17.0000 g | PACK | Freq: Every day | ORAL | Status: AC

## 2011-04-09 MED ORDER — OXYCODONE-ACETAMINOPHEN 5-325 MG PO TABS: 1.0000 | ORAL_TABLET | ORAL | Status: AC | PRN

## 2011-04-09 MED ORDER — POLYETHYLENE GLYCOL 3350 17 G PO PACK
17.0000 g | PACK | Freq: Every day | ORAL | Status: DC
  Filled 2011-04-09: qty 1

## 2011-04-09 NOTE — Discharge Summary (Signed)
Agree with discharge summary.

## 2011-04-09 NOTE — Progress Notes (Signed)
5 Days Post-Op  Subjective: Afebrile, VSS , BP is better, +BM, K+ is replaced, Glucose is still up some 157 this AM before breakfast. Feels good and ready for D/C  Objective: Vital signs in last 24 hours: Temp:  [98 F (36.7 C)-98.6 F (37 C)] 98.5 F (36.9 C) (03/13 0538) Pulse Rate:  [62-69] 69  (03/13 0540) Resp:  [18] 18  (03/13 0538) BP: (127-150)/(64-80) 133/76 mmHg (03/13 0540) SpO2:  [92 %-95 %] 95 % (03/13 0540) Last BM Date: 04/08/11  Intake/Output from previous day: 03/12 0701 - 03/13 0700 In: 1200 [P.O.:1200] Out: 500 [Urine:500] Intake/Output this shift:    PE:  Alert, Chest Clear.  ABD:  Soft, non tender, +BS, +BM, wounds look good  Lab Results:   Basename 04/07/11 1339  WBC 8.8  HGB 11.9*  HCT 34.3*  PLT 272    BMET  Basename 04/09/11 0505 04/07/11 1339  NA 136 137  K 4.3 2.9*  CL 102 96  CO2 30 33*  GLUCOSE 166* 176*  BUN 3* <3*  CREATININE 0.48* 0.44*  CALCIUM 8.9 8.4   PT/INR No results found for this basename: LABPROT:2,INR:2 in the last 72 hours   Studies/Results: No results found.  Anti-infectives: Anti-infectives     Start     Dose/Rate Route Frequency Ordered Stop   04/04/11 0900   ertapenem (INVANZ) 1 g in sodium chloride 0.9 % 50 mL IVPB  Status:  Discontinued        1 g 100 mL/hr over 30 Minutes Intravenous Every 24 hours 04/04/11 0813 04/05/11 1121         Current Facility-Administered Medications  Medication Dose Route Frequency Provider Last Rate Last Dose  . 0.9 % NaCl with KCl 20 mEq/ L  infusion   Intravenous Continuous Sherrie George, [place] 75 mL/hr at 04/09/11 0256    . acetaminophen (TYLENOL) tablet 650 mg  650 mg Oral Q6H PRN Sherrie George, PA   650 mg at 04/03/11 1200  . amLODipine (NORVASC) tablet 10 mg  10 mg Oral Daily Sherrie George, [place]   10 mg at 04/08/11 0939  . aspirin EC tablet 81 mg  81 mg Oral Daily Sherrie George, [place]   81 mg at 04/08/11 0941  . benazepril (LOTENSIN) tablet 20 mg  20 mg Oral  Daily Sherrie George, [place]   20 mg at 04/08/11 0942  . bimatoprost (LUMIGAN) 0.01 % ophthalmic solution 1 drop  1 drop Left Eye QHS Sherrie George, [place]   1 drop at 04/08/11 2142  . bisacodyl (DULCOLAX) suppository 10 mg  10 mg Rectal Daily Ardeth Sportsman, MD   10 mg at 04/05/11 1478  . brimonidine (ALPHAGAN) 0.2 % ophthalmic solution 1 drop  1 drop Left Eye Daily Sherrie George, [place]   1 drop at 04/08/11 0948  . carvedilol (COREG) tablet 6.25 mg  6.25 mg Oral QID Sherrie George, PA   6.25 mg at 04/08/11 2141  . diphenhydrAMINE (BENADRYL) injection 12.5 mg  12.5 mg Intravenous Q6H PRN Sherrie George, PA      . dorzolamide-timolol (COSOPT) 22.3-6.8 MG/ML ophthalmic solution 1 drop  1 drop Left Eye BID Sherrie George, [place]   1 drop at 04/08/11 2141  . heparin injection 5,000 Units  5,000 Units Subcutaneous Q8H Sherrie George, [place]   5,000 Units at 04/09/11 0531  . HYDROmorphone (DILAUDID) injection 0.5-2 mg  0.5-2 mg Intravenous Q30 min PRN Reece Packer, PHARMD   1 mg at 04/08/11 1847  . insulin aspart (  novoLOG) injection 0-15 Units  0-15 Units Subcutaneous TID AC & HS Ardeth Sportsman, MD   3 Units at 04/08/11 2143  . levothyroxine (SYNTHROID, LEVOTHROID) tablet 150 mcg  150 mcg Oral QAC breakfast Sherrie George, [place]   150 mcg at 04/08/11 0942  . lip balm (CARMEX) ointment 1 application  1 application Topical BID Ardeth Sportsman, MD   1 application at 04/08/11 2200  . magic mouthwash  15 mL Oral QID PRN Ardeth Sportsman, MD      . magnesium sulfate IVPB 2 g 50 mL  2 g Intravenous Once Sherrie George, [place]   2 g at 04/08/11 1610  . menthol-cetylpyridinium (CEPACOL) lozenge 3 mg  1 lozenge Oral PRN Ardeth Sportsman, MD   3 mg at 04/05/11 0423  . metFORMIN (GLUCOPHAGE) tablet 1,000 mg  1,000 mg Oral BID WC Sherrie George, PA   1,000 mg at 04/08/11 1705  . ondansetron (ZOFRAN) injection 4 mg  4 mg Intravenous Q6H PRN Sherrie George, PA   4 mg at 04/05/11 1741  . oxyCODONE-acetaminophen  (PERCOCET) 5-325 MG per tablet 1-2 tablet  1-2 tablet Oral Q4H PRN Sherrie George, PA      . oyster calcium tablet 500 mg of elemental calcium  500 mg of elemental calcium Oral Daily Berkley Harvey, PHARMD   500 mg of elemental calcium at 04/08/11 0942  . pantoprazole (PROTONIX) injection 40 mg  40 mg Intravenous QHS Sherrie George, PA   40 mg at 04/08/11 2142  . phenol (CHLORASEPTIC) mouth spray 2 spray  2 spray Mouth/Throat PRN Ardeth Sportsman, MD      . polyethylene glycol (MIRALAX / GLYCOLAX) packet 17 g  17 g Oral Daily Sherrie George, [place]   17 g at 04/08/11 0941  . potassium chloride (K-DUR,KLOR-CON) CR tablet 30 mEq  30 mEq Oral TID PC Sherrie George, PA   30 mEq at 04/08/11 1826  . simvastatin (ZOCOR) tablet 10 mg  10 mg Oral q1800 Sherrie George, PA   10 mg at 04/08/11 1705  . DISCONTD: acetaminophen (TYLENOL) suppository 650 mg  650 mg Rectal Q6H PRN Sherrie George, PA      . DISCONTD: acetaminophen (TYLENOL) tablet 650 mg  650 mg Oral Q6H PRN Sherrie George, PA      . DISCONTD: amLODipine-benazepril (LOTREL) 10-20 MG per capsule 1 capsule  1 capsule Oral Daily Sherrie George, [place]      . DISCONTD: bisacodyl (DULCOLAX) suppository 10 mg  10 mg Rectal Q12H PRN Ardeth Sportsman, MD      . DISCONTD: calcium carbonate (OS-CAL) tablet 600 mg  600 mg Oral Daily Sherrie George, [place]      . DISCONTD: ketorolac (TORADOL) 15 MG/ML injection 15-30 mg  15-30 mg Intravenous Q6H PRN Ardeth Sportsman, MD   15 mg at 04/07/11 1109  . DISCONTD: levothyroxine (SYNTHROID, LEVOTHROID) injection 76 mcg  76 mcg Intravenous Daily Sherrie George, [place]   76 mcg at 04/07/11 0943  . DISCONTD: metoprolol (LOPRESSOR) injection 10 mg  10 mg Intravenous Q6H Ardeth Sportsman, MD   10 mg at 04/08/11 0550    Assessment/Plan 1. Small bowel obstruction due to pelvic adhesions S/p laparotomy with lysis of adhesions 04/04/11  2. Hypertension  3. AODM  4.Hypothyroid on supplement  5. Glaucoma blind in Right  eye.  6. Hx of chest pain with normal stress test about 4 years ago.   Plan:  Discharge home today.  LOS: 9 days  Priscilla Tran 04/09/2011

## 2011-04-09 NOTE — Progress Notes (Signed)
Patient seen and examined.  Agree with PA's note.  

## 2011-04-09 NOTE — Progress Notes (Signed)
Pt d/c'd home. Pt received d/c instructions and verbalized understanding of home instructions. Pt belongings were carried via daughter.   Pt wheeled to front entrance where daughter transported home.  MCCLAIN, Naoko Diperna L  04/09/2011 [id number]

## 2011-04-14 ENCOUNTER — Telehealth (INDEPENDENT_AMBULATORY_CARE_PROVIDER_SITE_OTHER): Payer: Self-pay | Admitting: Surgery

## 2011-04-15 ENCOUNTER — Telehealth (INDEPENDENT_AMBULATORY_CARE_PROVIDER_SITE_OTHER): Payer: Self-pay

## 2011-04-15 NOTE — Telephone Encounter (Signed)
LMOM at home and cell giving the pt's f/u app with Dr Michaell Cowing to the emergency contact. I tried to call the pt at home but no answer or answering machine.

## 2011-04-15 NOTE — Telephone Encounter (Signed)
Pt returned my call. The pt received her f/u appt info.

## 2011-04-22 ENCOUNTER — Telehealth (INDEPENDENT_AMBULATORY_CARE_PROVIDER_SITE_OTHER): Payer: Self-pay

## 2011-04-22 NOTE — Telephone Encounter (Signed)
Pt calling into office c/o waking up this am with dizziness and feeling lightheaded. The pt checked her BP at home but wanted [place] to be aware. I advised pt that she needed to contact her PCP at [facility] in [place]. The pt understands and will call their office.

## 2011-04-23 ENCOUNTER — Encounter (HOSPITAL_COMMUNITY): Payer: Self-pay | Admitting: Surgery

## 2011-05-06 ENCOUNTER — Encounter: Payer: Self-pay | Admitting: Family Medicine

## 2011-05-07 ENCOUNTER — Encounter (INDEPENDENT_AMBULATORY_CARE_PROVIDER_SITE_OTHER): Payer: Self-pay | Admitting: Surgery

## 2011-05-07 ENCOUNTER — Ambulatory Visit (INDEPENDENT_AMBULATORY_CARE_PROVIDER_SITE_OTHER): Payer: Medicare Other | Admitting: Surgery

## 2011-05-07 VITALS — BP 136/78 | HR 70 | Temp 97.4°F | Resp 16 | Ht 64.0 in | Wt 162.2 lb

## 2011-05-07 DIAGNOSIS — K56609 Unspecified intestinal obstruction, unspecified as to partial versus complete obstruction: Secondary | ICD-10-CM

## 2011-05-07 DIAGNOSIS — K5900 Constipation, unspecified: Secondary | ICD-10-CM

## 2011-05-07 DIAGNOSIS — K5909 Other constipation: Secondary | ICD-10-CM | POA: Insufficient documentation

## 2011-05-07 NOTE — Patient Instructions (Signed)

## 2011-05-07 NOTE — Progress Notes (Signed)
Subjective:     Patient ID: Priscilla Tran, female   DOB: 23-Jul-1935, 76 y.o.   MRN: [id number]  HPI  Priscilla Tran  [birth year] [id number]  Patient Care Team: Aura Dials, MD as PCP - General (Family Medicine)  This patient is a 76 y.o.female who presents today for surgical evaluation.   Procedure: Laparoscopic lyses of adhesions to release a small bowel obstruction 04/04/2011  The patient comes in today with her daughter. She is feeling much better. Still constipated with a bowel movement about every 3 days. Uses p.r.n. Metamucil. Walking regularly. No nausea or vomiting. No crampy abdominal pain. Appetite is coming back. Wanting to go back to driving and doing her regular activity.  Patient Active Problem List  Diagnoses  . Hypothyroid  . Glaucoma associated with ocular inflammation, severe stage  . Disc degeneration, lumbar  . Hypertension  . Diabetes type 2, controlled  . Chronic constipation    Past Medical History  Diagnosis Date  . Hypertension   . Diabetes mellitus   . Hypothyroid   . Glaucoma associated with ocular inflammation, severe stage     blind Right eye  . Disc degeneration, lumbar     Dr. Regino Schultze  . SBO (small bowel obstruction) s/p lap LOA [id number] 03/31/2011    Past Surgical History  Procedure Date  . Laparoscopy 04/04/2011    Procedure: LAPAROSCOPY DIAGNOSTIC;  Surgeon: Ardeth Sportsman, MD;  Location: [facility]S;  Service: General;  Laterality: N/A;  . Eye surgery 2008    glaucoma right eye. Per patient she has had multiple surgeries on right eye, resulting in scar tissue.    History   Social History  . Marital Status: Widowed    Spouse Name: N/A    Number of Children: N/A  . Years of Education: N/A   Occupational History  . Not on file.   Social History Main Topics  . Smoking status: Never Smoker   . Smokeless tobacco: Never Used  . Alcohol Use: Yes     occasional/social on weekend  . Drug Use: No  . Sexually Active: No   Other Topics  Concern  . Not on file   Social History Narrative  . No narrative on file    Family History  Problem Relation Age of Onset  . Heart disease Mother   . Heart disease Father   . Cancer Brother     kidney    Current Outpatient Prescriptions  Medication Sig Dispense Refill  . amLODipine-benazepril (LOTREL) 10-20 MG per capsule Take 1 capsule by mouth 2 (two) times daily.       Marland Kitchen aspirin EC 81 MG tablet Take 81 mg by mouth daily.      . calcium carbonate (OS-CAL) 600 MG TABS Take 600 mg by mouth daily.      . carvedilol (COREG) 12.5 MG tablet Take 12.5 mg by mouth 2 (two) times daily with a meal.      . dorzolamide-timolol (COSOPT) 22.3-6.8 MG/ML ophthalmic solution Place 1 drop into both eyes 2 (two) times daily.      Marland Kitchen levothyroxine (SYNTHROID, LEVOTHROID) 150 MCG tablet Take 150 mcg by mouth daily.      Marland Kitchen lovastatin (MEVACOR) 10 MG tablet Take 10 mg by mouth at bedtime.      . metFORMIN (GLUCOPHAGE) 1000 MG tablet Take 1,000 mg by mouth 2 (two) times daily with a meal.         Allergies  Allergen Reactions  . Lisinopril Swelling  Face and throat. Breathing not affected per patient.    BP 136/78  Pulse 70  Temp(Src) 97.4 F (36.3 C) (Temporal)  Resp 16  Ht 5\' 4"  (1.626 m)  Wt 162 lb 4 oz (73.596 kg)  BMI 27.85 kg/m2     Review of Systems  Constitutional: Negative for fever, chills and diaphoresis.  HENT: Negative for ear pain, sore throat and trouble swallowing.   Eyes: Negative for photophobia and visual disturbance.  Respiratory: Negative for cough and choking.   Cardiovascular: Negative for chest pain and palpitations.  Gastrointestinal: Negative for nausea, vomiting, abdominal pain, diarrhea, constipation, anal bleeding and rectal pain.  Genitourinary: Negative for dysuria, frequency and difficulty urinating.  Musculoskeletal: Negative for myalgias and gait problem.  Skin: Negative for color change, pallor and rash.  Neurological: Negative for dizziness,  speech difficulty, weakness and numbness.  Hematological: Negative for adenopathy.  Psychiatric/Behavioral: Negative for confusion and agitation. The patient is not nervous/anxious.        Objective:   Physical Exam  Constitutional: She is oriented to person, place, and time. She appears well-developed and well-nourished. No distress.  HENT:  Head: Normocephalic.  Mouth/Throat: Oropharynx is clear and moist. No oropharyngeal exudate.  Eyes: Conjunctivae and EOM are normal. Pupils are equal, round, and reactive to light. No scleral icterus.  Neck: Normal range of motion. No tracheal deviation present.  Cardiovascular: Normal rate and intact distal pulses.   Pulmonary/Chest: Effort normal. No respiratory distress. She exhibits no tenderness.  Abdominal: Soft. She exhibits no distension. There is no tenderness. Hernia confirmed negative in the right inguinal area and confirmed negative in the left inguinal area.       Incisions clean with normal healing ridges.  No hernias  Genitourinary: No vaginal discharge found.  Musculoskeletal: Normal range of motion. She exhibits no tenderness.  Lymphadenopathy:       Right: No inguinal adenopathy present.       Left: No inguinal adenopathy present.  Neurological: She is alert and oriented to person, place, and time. No cranial nerve deficit. She exhibits normal muscle tone. Coordination normal.  Skin: Skin is warm and dry. No rash noted. She is not diaphoretic.  Psychiatric: She has a normal mood and affect. Her behavior is normal.       Assessment:     1 month s/p emergent lap LOA for SBO, improving.  Chronic constipation    Plan:     Increase activity as tolerated.  Do not push through pain. OK to resume pre-op activities  Advanced on diet as tolerated. Bowel regimen to avoid problems.  Use metamucil every day 1-4x/day to get to [facility] daily  Return to clinic p.r.n. The patient expressed understanding and appreciation

## 2012-11-10 ENCOUNTER — Telehealth (INDEPENDENT_AMBULATORY_CARE_PROVIDER_SITE_OTHER): Payer: Self-pay

## 2012-11-10 NOTE — Telephone Encounter (Signed)
Patient calling into office to report that she's having some constipation.  Patient states she has not had a BM in 3-4 days.  Patient denies having any abdominal pain, nausea/vomiting or fevers.  Patient reports that she takes Metamucil once daily.  Patient advised that per Dr. Michaell Cowing last office note, patient need's to take Metamucil  1-4x times daily, increase fiber intake and drink plenty of liquids as well. Patient advised to call our office if she has no improvement of symptoms worsen.  Patient verbalized understanding and agrees with plan as above.

## 2012-11-11 NOTE — Telephone Encounter (Signed)
Adding a good bowel regimen is essentially getting over this.  Handouts were given to her.  This was a trauma there basis to find out what fiber supplement works for her.  Gradually increase fiber so that the need for laxatives is much less.

## 2012-11-11 NOTE — Telephone Encounter (Signed)
Advised patient per Dr. Michaell Cowing ,fruits and green leafy vegetables and at least 8 cups of fluid is important to over come her symptoms of constipation. I will be mailing a high fiber diet plan to patient Patient was advised to call if she did not improve or she got worse. Patient verbalized understanding

## 2012-11-11 NOTE — Telephone Encounter (Signed)
Patient states she had a small soft Bm this am but, she is concerned she is not having a  regular bm. I ask her about her fiber intake she stated she was scared to eat because "she may get backed up" I expressed she need to eat fiber rich foods, fruits and increase her fluids ie prunes,apples,rasins,green leafy vegs, she may want to warm up the prune juice and add 2-3 TBS of metamucil to prune juice. I expressed her stools may be liquid at first. Advised her to call if her symptoms do not improve. Patient verbalized understanding

## 2013-05-25 ENCOUNTER — Ambulatory Visit: Payer: Medicare Other | Attending: Family Medicine | Admitting: Physical Therapy

## 2013-05-25 DIAGNOSIS — IMO0001 Reserved for inherently not codable concepts without codable children: Secondary | ICD-10-CM | POA: Insufficient documentation

## 2013-05-25 DIAGNOSIS — M545 Low back pain, unspecified: Secondary | ICD-10-CM | POA: Insufficient documentation

## 2013-06-01 ENCOUNTER — Ambulatory Visit: Payer: Medicare Other | Attending: Family Medicine | Admitting: Physical Therapy

## 2013-06-01 DIAGNOSIS — IMO0001 Reserved for inherently not codable concepts without codable children: Secondary | ICD-10-CM | POA: Insufficient documentation

## 2013-06-01 DIAGNOSIS — M545 Low back pain, unspecified: Secondary | ICD-10-CM | POA: Insufficient documentation

## 2013-06-03 ENCOUNTER — Ambulatory Visit: Payer: Medicare Other | Admitting: Physical Therapy

## 2013-06-03 DIAGNOSIS — IMO0001 Reserved for inherently not codable concepts without codable children: Secondary | ICD-10-CM | POA: Diagnosis not present

## 2013-06-08 ENCOUNTER — Ambulatory Visit: Payer: Medicare Other | Admitting: Physical Therapy

## 2013-06-08 DIAGNOSIS — IMO0001 Reserved for inherently not codable concepts without codable children: Secondary | ICD-10-CM | POA: Diagnosis not present

## 2013-06-10 ENCOUNTER — Ambulatory Visit: Payer: Medicare Other | Admitting: Physical Therapy

## 2013-06-15 ENCOUNTER — Ambulatory Visit: Payer: Medicare Other | Admitting: Physical Therapy

## 2013-06-15 DIAGNOSIS — IMO0001 Reserved for inherently not codable concepts without codable children: Secondary | ICD-10-CM | POA: Diagnosis not present

## 2013-06-16 ENCOUNTER — Telehealth (INDEPENDENT_AMBULATORY_CARE_PROVIDER_SITE_OTHER): Payer: Self-pay

## 2013-06-16 NOTE — Telephone Encounter (Signed)
Pt is calling in today c/o constipation. Pt states that she has had gas and bloating for about a week. Pt states that she takes metamucil once a day and she normally puts a prune in her coffee. Encouraged pt  she need to eat fiber rich foods, fruits and increase her fluids. Advised pt that she needs to make sure that she is walking as much as she is able to. Pt states she will increase her fibers and will also increase her Metamucil. Pt verbalized understanding.

## 2013-06-17 ENCOUNTER — Ambulatory Visit: Payer: Medicare Other | Admitting: Physical Therapy

## 2013-06-22 ENCOUNTER — Ambulatory Visit: Payer: Medicare Other | Admitting: Physical Therapy

## 2013-06-22 DIAGNOSIS — IMO0001 Reserved for inherently not codable concepts without codable children: Secondary | ICD-10-CM | POA: Diagnosis not present

## 2013-06-30 ENCOUNTER — Ambulatory Visit: Payer: Medicare Other | Admitting: Physical Therapy

## 2014-08-11 ENCOUNTER — Encounter: Payer: Self-pay | Admitting: *Deleted

## 2014-08-16 ENCOUNTER — Encounter: Payer: Self-pay | Admitting: Cardiovascular Disease

## 2014-08-17 ENCOUNTER — Encounter: Payer: Self-pay | Admitting: Cardiovascular Disease

## 2016-05-14 ENCOUNTER — Other Ambulatory Visit: Payer: Self-pay | Admitting: General Surgery

## 2016-05-14 DIAGNOSIS — R1084 Generalized abdominal pain: Secondary | ICD-10-CM

## 2016-05-16 ENCOUNTER — Ambulatory Visit
Admission: RE | Admit: 2016-05-16 | Discharge: 2016-05-16 | Disposition: A | Discharge status: Home / Self Care | Payer: Medicare HMO | Source: Ambulatory Visit | Attending: General Surgery | Admitting: General Surgery

## 2016-05-16 DIAGNOSIS — R1084 Generalized abdominal pain: Secondary | ICD-10-CM

## 2016-05-16 MED ORDER — IOPAMIDOL (ISOVUE-300) INJECTION 61%
100.0000 mL | Freq: Once | INTRAVENOUS | Status: AC | PRN
  Administered 2016-05-16: 100 mL via INTRAVENOUS

## 2017-08-22 ENCOUNTER — Observation Stay (HOSPITAL_COMMUNITY)
Admission: EM | Admit: 2017-08-22 | Discharge: 2017-08-24 | Disposition: A | Discharge status: Home / Self Care | Payer: Medicare HMO | Attending: Internal Medicine | Admitting: Internal Medicine

## 2017-08-22 ENCOUNTER — Emergency Department (HOSPITAL_COMMUNITY): Payer: Medicare HMO

## 2017-08-22 ENCOUNTER — Observation Stay (HOSPITAL_COMMUNITY): Payer: Medicare HMO

## 2017-08-22 ENCOUNTER — Encounter (HOSPITAL_COMMUNITY): Payer: Self-pay | Admitting: *Deleted

## 2017-08-22 DIAGNOSIS — R8279 Other abnormal findings on microbiological examination of urine: Secondary | ICD-10-CM | POA: Insufficient documentation

## 2017-08-22 DIAGNOSIS — E785 Hyperlipidemia, unspecified: Secondary | ICD-10-CM | POA: Insufficient documentation

## 2017-08-22 DIAGNOSIS — R531 Weakness: Secondary | ICD-10-CM | POA: Diagnosis present

## 2017-08-22 DIAGNOSIS — Z7984 Long term (current) use of oral hypoglycemic drugs: Secondary | ICD-10-CM | POA: Diagnosis not present

## 2017-08-22 DIAGNOSIS — Z79899 Other long term (current) drug therapy: Secondary | ICD-10-CM | POA: Diagnosis not present

## 2017-08-22 DIAGNOSIS — I10 Essential (primary) hypertension: Secondary | ICD-10-CM

## 2017-08-22 DIAGNOSIS — G459 Transient cerebral ischemic attack, unspecified: Secondary | ICD-10-CM | POA: Diagnosis not present

## 2017-08-22 DIAGNOSIS — E039 Hypothyroidism, unspecified: Secondary | ICD-10-CM | POA: Insufficient documentation

## 2017-08-22 DIAGNOSIS — E119 Type 2 diabetes mellitus without complications: Secondary | ICD-10-CM | POA: Diagnosis not present

## 2017-08-22 DIAGNOSIS — R29810 Facial weakness: Secondary | ICD-10-CM

## 2017-08-22 DIAGNOSIS — H4040X3 Glaucoma secondary to eye inflammation, unspecified eye, severe stage: Secondary | ICD-10-CM | POA: Diagnosis present

## 2017-08-22 DIAGNOSIS — R410 Disorientation, unspecified: Secondary | ICD-10-CM

## 2017-08-22 DIAGNOSIS — H409 Unspecified glaucoma: Secondary | ICD-10-CM | POA: Insufficient documentation

## 2017-08-22 DIAGNOSIS — R471 Dysarthria and anarthria: Secondary | ICD-10-CM

## 2017-08-22 LAB — COMPREHENSIVE METABOLIC PANEL WITH GFR
ALT: 13 U/L (ref 0–44)
AST: 21 U/L (ref 15–41)
Albumin: 4.2 g/dL (ref 3.5–5.0)
Alkaline Phosphatase: 62 U/L (ref 38–126)
Anion gap: 9 (ref 5–15)
BUN: 11 mg/dL (ref 8–23)
CO2: 28 mmol/L (ref 22–32)
Calcium: 9.9 mg/dL (ref 8.9–10.3)
Chloride: 102 mmol/L (ref 98–111)
Creatinine, Ser: 0.6 mg/dL (ref 0.44–1.00)
GFR calc Af Amer: >60 mL/min
GFR calc non Af Amer: >60 mL/min
Glucose, Bld: 164 mg/dL — ABNORMAL HIGH (ref 70–99)
Potassium: 3.3 mmol/L — ABNORMAL LOW (ref 3.5–5.1)
Sodium: 139 mmol/L (ref 135–145)
Total Bilirubin: 1.1 mg/dL (ref 0.3–1.2)
Total Protein: 7.6 g/dL (ref 6.5–8.1)

## 2017-08-22 LAB — URINALYSIS, ROUTINE W REFLEX MICROSCOPIC
Bilirubin Urine: NEGATIVE
GLUCOSE, UA: NEGATIVE mg/dL
HGB URINE DIPSTICK: NEGATIVE
Ketones, ur: NEGATIVE mg/dL
Nitrite: NEGATIVE
Protein, ur: NEGATIVE mg/dL
SPECIFIC GRAVITY, URINE: 1.004 — AB (ref 1.005–1.030)
pH: 7 (ref 5.0–8.0)

## 2017-08-22 LAB — DIFFERENTIAL
Basophils Absolute: 0 K/uL (ref 0.0–0.1)
Basophils Relative: 0 %
Eosinophils Absolute: 0.1 K/uL (ref 0.0–0.7)
Eosinophils Relative: 1 %
Lymphocytes Relative: 24 %
Lymphs Abs: 1.9 K/uL (ref 0.7–4.0)
Monocytes Absolute: 0.8 K/uL (ref 0.1–1.0)
Monocytes Relative: 10 %
Neutro Abs: 5 K/uL (ref 1.7–7.7)
Neutrophils Relative %: 65 %

## 2017-08-22 LAB — I-STAT CHEM 8, ED
BUN: 10 mg/dL (ref 8–23)
Calcium, Ion: 1.18 mmol/L (ref 1.15–1.40)
Chloride: 101 mmol/L (ref 98–111)
Creatinine, Ser: 0.6 mg/dL (ref 0.44–1.00)
Glucose, Bld: 163 mg/dL — ABNORMAL HIGH (ref 70–99)
HCT: 38 % (ref 36.0–46.0)
Hemoglobin: 12.9 g/dL (ref 12.0–15.0)
Potassium: 3.3 mmol/L — ABNORMAL LOW (ref 3.5–5.1)
Sodium: 140 mmol/L (ref 135–145)
TCO2: 28 mmol/L (ref 22–32)

## 2017-08-22 LAB — RAPID URINE DRUG SCREEN, HOSP PERFORMED
Amphetamines: NOT DETECTED
Barbiturates: NOT DETECTED
Benzodiazepines: NOT DETECTED
Cocaine: NOT DETECTED
Opiates: NOT DETECTED
Tetrahydrocannabinol: NOT DETECTED

## 2017-08-22 LAB — PROTIME-INR
INR: 0.9
PROTHROMBIN TIME: 12.1 s (ref 11.4–15.2)

## 2017-08-22 LAB — CBC
HCT: 37.7 % (ref 36.0–46.0)
Hemoglobin: 12.4 g/dL (ref 12.0–15.0)
MCH: 27.3 pg (ref 26.0–34.0)
MCHC: 32.9 g/dL (ref 30.0–36.0)
MCV: 82.9 fL (ref 78.0–100.0)
PLATELETS: 238 10*3/uL (ref 150–400)
RBC: 4.55 MIL/uL (ref 3.87–5.11)
RDW: 15.5 % (ref 11.5–15.5)
WBC: 7.8 10*3/uL (ref 4.0–10.5)

## 2017-08-22 LAB — I-STAT TROPONIN, ED: Troponin i, poc: 0.02 ng/mL (ref 0.00–0.08)

## 2017-08-22 LAB — APTT: aPTT: 28 seconds (ref 24–36)

## 2017-08-22 LAB — CBG MONITORING, ED: GLUCOSE-CAPILLARY: 138 mg/dL — AB (ref 70–99)

## 2017-08-22 LAB — ETHANOL: Alcohol, Ethyl (B): <10 mg/dL

## 2017-08-22 MED ORDER — PREDNISOLONE ACETATE 1 % OP SUSP
1.0000 [drp] | Freq: Two times a day (BID) | OPHTHALMIC | Status: DC
  Administered 2017-08-23 – 2017-08-24 (×4): 1 [drp] via OPHTHALMIC
  Filled 2017-08-22: qty 5

## 2017-08-22 MED ORDER — STROKE: EARLY STAGES OF RECOVERY BOOK
Freq: Once | Status: DC
  Filled 2017-08-22: qty 1

## 2017-08-22 MED ORDER — LEVOTHYROXINE SODIUM 150 MCG PO TABS
150.0000 ug | ORAL_TABLET | Freq: Every day | ORAL | Status: DC
  Administered 2017-08-23 – 2017-08-24 (×2): 150 ug via ORAL
  Filled 2017-08-22: qty 1
  Filled 2017-08-22: qty 2
  Filled 2017-08-22: qty 1
  Filled 2017-08-22: qty 2

## 2017-08-22 MED ORDER — ASPIRIN 325 MG PO TABS
325.0000 mg | ORAL_TABLET | Freq: Every day | ORAL | Status: DC
  Administered 2017-08-23: 325 mg via ORAL
  Filled 2017-08-22: qty 1

## 2017-08-22 MED ORDER — ACETAMINOPHEN 325 MG PO TABS: 650.0000 mg | ORAL_TABLET | ORAL | Status: DC | PRN

## 2017-08-22 MED ORDER — ENOXAPARIN SODIUM 40 MG/0.4ML ~~LOC~~ SOLN
40.0000 mg | SUBCUTANEOUS | Status: DC
  Administered 2017-08-23 – 2017-08-24 (×2): 40 mg via SUBCUTANEOUS
  Filled 2017-08-22 (×3): qty 0.4

## 2017-08-22 MED ORDER — CALCIUM CARBONATE 1250 (500 CA) MG PO TABS
1250.0000 mg | ORAL_TABLET | Freq: Every day | ORAL | Status: DC
  Administered 2017-08-23 – 2017-08-24 (×2): 1250 mg via ORAL
  Filled 2017-08-22 (×2): qty 1

## 2017-08-22 MED ORDER — DORZOLAMIDE HCL-TIMOLOL MAL 2-0.5 % OP SOLN
1.0000 [drp] | Freq: Two times a day (BID) | OPHTHALMIC | Status: DC
  Administered 2017-08-23 – 2017-08-24 (×4): 1 [drp] via OPHTHALMIC
  Filled 2017-08-22: qty 10

## 2017-08-22 MED ORDER — METFORMIN HCL 500 MG PO TABS
1000.0000 mg | ORAL_TABLET | Freq: Two times a day (BID) | ORAL | Status: DC
  Administered 2017-08-23 – 2017-08-24 (×3): 1000 mg via ORAL
  Filled 2017-08-22 (×3): qty 2

## 2017-08-22 MED ORDER — ACETAMINOPHEN 650 MG RE SUPP: 650.0000 mg | RECTAL | Status: DC | PRN

## 2017-08-22 MED ORDER — PRAVASTATIN SODIUM 20 MG PO TABS
10.0000 mg | ORAL_TABLET | Freq: Every day | ORAL | Status: DC
  Administered 2017-08-23: 10 mg via ORAL
  Filled 2017-08-22: qty 1

## 2017-08-22 MED ORDER — ACETAMINOPHEN 160 MG/5ML PO SOLN: 650.0000 mg | ORAL | Status: DC | PRN

## 2017-08-22 MED ORDER — ASPIRIN 600 MG RE SUPP
300.0000 mg | Freq: Every day | RECTAL | Status: DC
  Filled 2017-08-22 (×2): qty 1

## 2017-08-22 MED ORDER — SENNOSIDES-DOCUSATE SODIUM 8.6-50 MG PO TABS: 1.0000 | ORAL_TABLET | Freq: Every evening | ORAL | Status: DC | PRN

## 2017-08-22 NOTE — ED Notes (Signed)
Bed: WA08 Expected date:  Expected time:  Means of arrival:  Comments: EMS 

## 2017-08-22 NOTE — ED Triage Notes (Signed)
Pt daughter states the pt had slurred speech, confusion, left sided facial droop for ~20 minutes at 1530 today. Pt believed it was her blood sugar, so she ate an apple pie. No slurred speech, facial droop or arm drift noted in triage. Pt is hypertensive in triage, states she did not take her BP medication today.

## 2017-08-22 NOTE — ED Notes (Signed)
Swallow screen  repeated, pt passed, no problems after cracker was given.

## 2017-08-22 NOTE — ED Provider Notes (Signed)
Emergency Department Provider Note   I have reviewed the triage vital signs and the nursing notes.   HISTORY  Chief Complaint Aphasia; Facial Droop; and Altered Mental Status   HPI Priscilla Tran is a 82 y.o. female with history of diabetes, hyperlipidemia, hypertension bowel obstructions presents the emergency department today secondary to facial droop and dysarthria.  Patient's family is with her and states that approximately 1530 the patient had a 20-minute episode of left-sided facial droop confusion and slurred speech that was rapid onset.  It might be her blood sugar associate apple pie but this did not seem to improve it.  But slowly improved afterwards.  She did not have any other neurologic symptoms. No other associated or modifying symptoms.    Past Medical History:  Diagnosis Date  . Diabetes mellitus   . Disc degeneration, lumbar    Dr. Regino SchultzeWang  . Glaucoma associated with ocular inflammation, severe stage    blind Right eye  . Hyperlipidemia   . Hypertension   . Hypothyroid   . SBO (small bowel obstruction) s/p lap LOA [id number] 03/31/2011    Patient Active Problem List   Diagnosis Date Noted  . Chronic constipation 05/07/2011  . Diabetes type 2, controlled (HCC) 03/31/2011  . Hypothyroid   . Glaucoma associated with ocular inflammation, severe stage   . Disc degeneration, lumbar   . Hypertension     Past Surgical History:  Procedure Laterality Date  . EYE SURGERY  2008   glaucoma right eye. Per patient she has had multiple surgeries on right eye, resulting in scar tissue.  Marland Kitchen. LAPAROSCOPY  04/04/2011   Procedure: LAPAROSCOPY DIAGNOSTIC;  Surgeon: Ardeth SportsmanSteven C. Gross, MD;  Location: [facility];  Service: General;  Laterality: N/A;  . NM MYOCAR PERF WALL MOTION  06/20/2010   Normal  . US ECHOCARDIOGRAPHY  05/06/2005   Mild MR, PI    Current Outpatient Rx  . Order #: [id number] Class: Historical Med  . Order #: [id number] Class: Historical Med  . Order #: [id number] Class:  Historical Med  . Order #: [id number] Class: Historical Med  . Order #: [id number] Class: Historical Med  . Order #: [id number] Class: Historical Med  . Order #: [id number] Class: Historical Med  . Order #: [id number] Class: Historical Med    Allergies Lisinopril  Family History  Problem Relation Age of Onset  . Heart disease Mother   . Heart attack Mother   . Heart disease Father   . Heart attack Father   . Cancer Brother        kidney    Social History Social History   Tobacco Use  . Smoking status: Never Smoker  . Smokeless tobacco: Never Used  Substance Use Topics  . Alcohol use: Yes    Comment: occasional/social on weekend  . Drug use: No    Review of Systems  All other systems negative except as documented in the HPI. All pertinent positives and negatives as reviewed in the HPI. ____________________________________________   PHYSICAL EXAM:  VITAL SIGNS: Blood pressure (!) 209/96, pulse 78, temperature 99.8 F (37.7 C), temperature source Oral, resp. rate 18, SpO2 96 %.   Constitutional: Alert and oriented. Well appearing and in no acute distress. Eyes: right eye with abnormal EOM, hazy, pupil non-reactive. Left eye normal.  Head: Atraumatic. Nose: No congestion/rhinnorhea. Mouth/Throat: Mucous membranes are moist.  Oropharynx non-erythematous. Neck: No stridor.  No meningeal signs.   Cardiovascular: Normal rate, regular rhythm. Good peripheral circulation. Grossly normal heart sounds.   Respiratory: Normal respiratory  effort.  No retractions. Lungs CTAB. Gastrointestinal: Soft and nontender. No distention.  Musculoskeletal: No lower extremity tenderness nor edema. No gross deformities of extremities. Neurologic:  Normal speech and language. No gross focal neurologic deficits are appreciated.  Skin:  Skin is warm, dry and intact. No rash noted.  ____________________________________________   LABS (all labs ordered are listed, but only abnormal results are  displayed)  Labs Reviewed  COMPREHENSIVE METABOLIC PANEL - Abnormal; Notable for the following components:      Result Value   Potassium 3.3 (*)    Glucose, Bld 164 (*)    All other components within normal limits  URINALYSIS, ROUTINE W REFLEX MICROSCOPIC - Abnormal; Notable for the following components:   Color, Urine STRAW (*)    Specific Gravity, Urine 1.004 (*)    Leukocytes, UA TRACE (*)    Bacteria, UA FEW (*)    All other components within normal limits  I-STAT CHEM 8, ED - Abnormal; Notable for the following components:   Potassium 3.3 (*)    Glucose, Bld 163 (*)    All other components within normal limits  URINE CULTURE  ETHANOL  PROTIME-INR  APTT  CBC  DIFFERENTIAL  RAPID URINE DRUG SCREEN, HOSP PERFORMED  I-STAT TROPONIN, ED   ____________________________________________  EKG   EKG Interpretation  Date/Time:  Saturday August 22 2017 17:52:24 EDT Ventricular Rate:  87 PR Interval:    QRS Duration: 98 QT Interval:  377 QTC Calculation: 454 R Axis:   -54 Text Interpretation:  Sinus rhythm LAD, consider left anterior fascicular block Anteroseptal infarct, old mild ST depression in V6 similar to march 2013, rate faster Confirmed by Marily Memos [phone]) on 08/22/2017 6:11:23 PM       ____________________________________________  RADIOLOGY  Ct Head Code Stroke Wo Contrast  Result Date: 08/22/2017 CLINICAL DATA:  Code stroke. Left-sided facial droop with slurred speech. EXAM: CT HEAD WITHOUT CONTRAST TECHNIQUE: Contiguous axial images were obtained from the base of the skull through the vertex without intravenous contrast. COMPARISON:  None. FINDINGS: Brain: There is no mass, hemorrhage or extra-axial collection. The size and configuration of the ventricles and extra-axial CSF spaces are normal. Age indeterminate small vessel infarct of the right thalamus. There is hypoattenuation of the periventricular white matter, most commonly indicating chronic ischemic  microangiopathy. Vascular: No abnormal hyperdensity of the major intracranial arteries or dural venous sinuses. No intracranial atherosclerosis. Skull: The visualized skull base, calvarium and extracranial soft tissues are normal. Sinuses/Orbits: No fluid levels or advanced mucosal thickening of the visualized paranasal sinuses. No mastoid or middle ear effusion. The orbits are normal. ASPECTS [facility] Stroke Program Early CT Score) - Ganglionic level infarction (caudate, lentiform nuclei, internal capsule, insula, M1-M3 cortex): 7 - Supraganglionic infarction (M4-M6 cortex): 3 Total score (0-10 with 10 being normal): 10 IMPRESSION: 1. No acute hemorrhage. 2. Age-indeterminate small vessel infarct of the right thalamus. MRI could be obtained for better temporal characterization, if indicated. 3. ASPECTS is 10. 4. These results were called by telephone at the time of interpretation on 08/22/2017 at 5:46 pm to Dr. Marily Memos , who verbally acknowledged these results. Electronically Signed   By: Deatra Robinson M.D.   On: 08/22/2017 17:46    ____________________________________________   PROCEDURES  Procedure(s) performed:   Procedures   ____________________________________________   INITIAL IMPRESSION / ASSESSMENT AND PLAN / ED COURSE  Suspect TIA, will work up accordingly.  No recurrent symptoms.  As chronic findings on CT scan but no acute findings.  Has a questionably positive urinalysis so urine culture added on.   Will admit for further workup. No indication for tPA 2/2 resolved symptoms     Pertinent labs & imaging results that were available during my care of the patient were reviewed by me and considered in my medical decision making (see chart for details).  ____________________________________________  FINAL CLINICAL IMPRESSION(S) / ED DIAGNOSES  Final diagnoses:  Dysarthria  Facial droop  Confusion     MEDICATIONS GIVEN DURING THIS VISIT:  Medications - No data to  display   NEW OUTPATIENT MEDICATIONS STARTED DURING THIS VISIT:  New Prescriptions   No medications on file    Note:  This note was prepared with assistance of Dragon voice recognition software. Occasional wrong-word or sound-a-like substitutions may have occurred due to the inherent limitations of voice recognition software.   Girtrude Enslin, Barbara Cower, MD 08/22/17 [phone]

## 2017-08-22 NOTE — H&P (Signed)
History and Physical    Priscilla Tran ZOX:[id number] DOB: 1935-04-17 DOA: 08/22/2017  PCP: Tracey Harries, MD  Patient coming from: Home  I have personally briefly reviewed patient's old medical records in [facility]  Chief Complaint: L facial droop, slurred speech  HPI: Priscilla Tran is a 82 y.o. 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  Patient presents to the ED with c/o 20 min episode of L sided facial droop, slurred speech that occurred 1530.  Symptoms resolved at this time.  Reports having forgotten to take BP meds earlier today.    ED Course: BP 181/73 currently.  Symptoms remain resolved.  CT head shows age indeterminate R thalamic infarct.   Review of Systems: As per HPI otherwise 10 point review of systems negative.   Past Medical History:  Diagnosis Date  . Diabetes mellitus   . Disc degeneration, lumbar    Dr. Regino Schultze  . Glaucoma associated with ocular inflammation, severe stage    blind Right eye  . Hyperlipidemia   . Hypertension   . Hypothyroid   . SBO (small bowel obstruction) s/p lap LOA [id number] 03/31/2011    Past Surgical History:  Procedure Laterality Date  . EYE SURGERY  2008   glaucoma right eye. Per patient she has had multiple surgeries on right eye, resulting in scar tissue.  Marland Kitchen LAPAROSCOPY  04/04/2011   Procedure: LAPAROSCOPY DIAGNOSTIC;  Surgeon: Ardeth Sportsman, MD;  Location: [facility];  Service: General;  Laterality: N/A;  . NM MYOCAR PERF WALL MOTION  06/20/2010   Normal  . US ECHOCARDIOGRAPHY  05/06/2005   Mild MR, PI     reports that she has never smoked. She has never used smokeless tobacco. She reports that she drinks alcohol. She reports that she does not use drugs.  Allergies  Allergen Reactions  . Lisinopril Swelling    Face and throat. Breathing not affected per patient.    Family History  Problem Relation Age of Onset  . Heart disease Mother   . Heart attack Mother   . Heart disease  Father   . Heart attack Father   . Cancer Brother        kidney     Prior to Admission medications   Medication Sig Start Date End Date Taking? Authorizing Provider  amLODipine (NORVASC) 10 MG tablet Take 10 mg by mouth daily. 07/28/17  Yes [provider]  carvedilol (COREG) 12.5 MG tablet Take 12.5 mg by mouth 2 (two) times daily with a meal.   Yes [provider]  metFORMIN (GLUCOPHAGE) 1000 MG tablet Take 1,000 mg by mouth 2 (two) times daily with a meal.   Yes [provider]  aspirin EC 81 MG tablet Take 81 mg by mouth daily.    [provider]  calcium carbonate (OS-CAL) 600 MG TABS Take 600 mg by mouth daily.    [provider]  dorzolamide-timolol (COSOPT) 22.3-6.8 MG/ML ophthalmic solution Place 1 drop into both eyes 2 (two) times daily.    [provider]  levothyroxine (SYNTHROID, LEVOTHROID) 150 MCG tablet Take 150 mcg by mouth daily.    [provider]  lovastatin (MEVACOR) 10 MG tablet Take 10 mg by mouth at bedtime.    [provider]    Physical Exam: Vitals:   08/22/17 1830 08/22/17 1900 08/22/17 1930 08/22/17 2000  BP: (!) 153/78 (!) 152/71 (!) 165/76 (!) 176/127  Pulse: 80 84 62 79  Resp: 13 12 13 16   Temp:      TempSrc:      SpO2: 99% 97% 97% 99%    Constitutional: NAD, calm, comfortable Eyes: PERRL, lids and conjunctivae normal ENMT: Blind R eye Neck: normal, supple, no masses, no thyromegaly Respiratory: clear to auscultation bilaterally, no wheezing, no crackles. Normal respiratory effort. No accessory muscle use.  Cardiovascular: Regular rate and rhythm, no murmurs / rubs / gallops. No extremity edema. 2+ pedal pulses. No carotid bruits.  Abdomen: no tenderness, no masses palpated. No hepatosplenomegaly. Bowel sounds positive.  Musculoskeletal: no clubbing / cyanosis. No joint deformity upper and lower extremities. Good ROM, no contractures. Normal muscle tone.  Skin: no rashes,  lesions, ulcers. No induration Neurologic: CN 2-12 grossly intact. Sensation intact, DTR normal. Strength 5/5 in all 4.  Psychiatric: Normal judgment and insight. Alert and oriented x 3. Normal mood.    Labs on Admission: I have personally reviewed following labs and imaging studies  CBC: Recent Labs  Lab 08/22/17 1747 08/22/17 1802  WBC 7.8  --   NEUTROABS 5.0  --   HGB 12.4 12.9  HCT 37.7 38.0  MCV 82.9  --   PLT 238  --    Basic Metabolic Panel: Recent Labs  Lab 08/22/17 1747 08/22/17 1802  NA 139 140  K 3.3* 3.3*  CL 102 101  CO2 28  --   GLUCOSE 164* 163*  BUN 11 10  CREATININE 0.60 0.60  CALCIUM 9.9  --    GFR: CrCl cannot be calculated (Unknown ideal weight.). Liver Function Tests: Recent Labs  Lab 08/22/17 1747  AST 21  ALT 13  ALKPHOS 62  BILITOT 1.1  PROT 7.6  ALBUMIN 4.2   No results for input(s): LIPASE, AMYLASE in the last 168 hours. No results for input(s): AMMONIA in the last 168 hours. Coagulation Profile: Recent Labs  Lab 08/22/17 1747  INR 0.90   Cardiac Enzymes: No results for input(s): CKTOTAL, CKMB, CKMBINDEX, TROPONINI in the last 168 hours. BNP (last 3 results) No results for input(s): PROBNP in the last 8760 hours. HbA1C: No results for input(s): HGBA1C in the last 72 hours. CBG: No results for input(s): GLUCAP in the last 168 hours. Lipid Profile: No results for input(s): CHOL, HDL, LDLCALC, TRIG, CHOLHDL, LDLDIRECT in the last 72 hours. Thyroid Function Tests: No results for input(s): TSH, T4TOTAL, FREET4, T3FREE, THYROIDAB in the last 72 hours. Anemia Panel: No results for input(s): VITAMINB12, FOLATE, FERRITIN, TIBC, IRON, RETICCTPCT in the last 72 hours. Urine analysis:    Component Value Date/Time   COLORURINE STRAW (A) 08/22/2017 1716   APPEARANCEUR CLEAR 08/22/2017 1716   LABSPEC 1.004 (L) 08/22/2017 1716   PHURINE 7.0 08/22/2017 1716   GLUCOSEU NEGATIVE 08/22/2017 1716   HGBUR NEGATIVE 08/22/2017 1716    BILIRUBINUR NEGATIVE 08/22/2017 1716   BILIRUBINUR neg 03/30/2011 1924   KETONESUR NEGATIVE 08/22/2017 1716   PROTEINUR NEGATIVE 08/22/2017 1716   UROBILINOGEN 0.2 03/31/2011 0807   NITRITE NEGATIVE 08/22/2017 1716   LEUKOCYTESUR TRACE (A) 08/22/2017 1716    Radiological Exams on Admission: Ct Head Code Stroke Wo Contrast  Result Date: 08/22/2017 CLINICAL DATA:  Code stroke. Left-sided facial droop with slurred speech. EXAM: CT HEAD WITHOUT CONTRAST TECHNIQUE: Contiguous axial images were obtained from the base of the skull through the vertex without intravenous contrast. COMPARISON:  None. FINDINGS: Brain: There is no mass, hemorrhage or extra-axial collection. The size and configuration of the ventricles and extra-axial CSF spaces are normal.  Age indeterminate small vessel infarct of the right thalamus. There is hypoattenuation of the periventricular white matter, most commonly indicating chronic ischemic microangiopathy. Vascular: No abnormal hyperdensity of the major intracranial arteries or dural venous sinuses. No intracranial atherosclerosis. Skull: The visualized skull base, calvarium and extracranial soft tissues are normal. Sinuses/Orbits: No fluid levels or advanced mucosal thickening of the visualized paranasal sinuses. No mastoid or middle ear effusion. The orbits are normal. ASPECTS [facility] Stroke Program Early CT Score) - Ganglionic level infarction (caudate, lentiform nuclei, internal capsule, insula, M1-M3 cortex): 7 - Supraganglionic infarction (M4-M6 cortex): 3 Total score (0-10 with 10 being normal): 10 IMPRESSION: 1. No acute hemorrhage. 2. Age-indeterminate small vessel infarct of the right thalamus. MRI could be obtained for better temporal characterization, if indicated. 3. ASPECTS is 10. 4. These results were called by telephone at the time of interpretation on 08/22/2017 at 5:46 pm to Dr. Marily Memos , who verbally acknowledged these results. Electronically Signed   By: Deatra Robinson M.D.   On: 08/22/2017 17:46    EKG: Independently reviewed.  Assessment/Plan Principal Problem:   TIA (transient ischemic attack) Active Problems:   Glaucoma associated with ocular inflammation, severe stage   Hypertension   Diabetes type 2, controlled (HCC)    1. TIA - 1. TIA pathway, work up ordered 2. MRI, MRA, 2d echo, carotid duplex etc. 3. ASA 4. Will let neuro know of transfer to [facility]. 2. HTN - 1. Allow permissive HTN for the moment 3. DM2 - continue metformin  DVT prophylaxis: Lovenox Code Status: Full Family Communication: Family at bedside Disposition Plan: Home after admit Consults called: Sent message to Dr. Amada Jupiter that we were transferring patient to [facility]. Admission status: Place in obs   Hillary Bow. DO [facility] Pager [phone] Only works nights!  If 7AM-7PM, please contact the primary day team physician taking care of patient  [email or web address] Password [facility]  08/22/2017, 8:22 PM

## 2017-08-22 NOTE — ED Notes (Signed)
ED TO INPATIENT HANDOFF REPORT  Name/Age/Gender Priscilla Tran 82 y.o. female  Code Status    Code Status Orders  (From admission, onward)        Start     Ordered   08/22/17 2017  Full code  Continuous     08/22/17 2018    Code Status History    Date Active Date Inactive Code Status Order ID Comments User Context   03/31/2011 1323 04/09/2011 1223 Full Code [id number]  Earnstine Regal, [place] [facility]      Home/SNF/Other Home  Chief Complaint Possible Stroke   Level of Care/Admitting Diagnosis ED Disposition    ED Disposition Condition [facility] Area: [place] [100100]  Level of Care: Telemetry [5]  Diagnosis: TIA (transient ischemic attack) [702637]  Admitting Physician: Etta Quill [phone]  Attending Physician: Etta Quill [[id number]]  PT Class (Do Not Modify): Observation [104]  PT Acc Code (Do Not Modify): Observation [10022]       Medical History Past Medical History:  Diagnosis Date  . Diabetes mellitus   . Disc degeneration, lumbar    Dr. Mina Marble  . Glaucoma associated with ocular inflammation, severe stage    blind Right eye  . Hyperlipidemia   . Hypertension   . Hypothyroid   . SBO (small bowel obstruction) s/p lap LOA [id number] 03/31/2011    Allergies Allergies  Allergen Reactions  . Lisinopril Swelling    Face and throat. Breathing not affected per patient.    IV Location/Drains/Wounds Patient Lines/Drains/Airways Status   Active Line/Drains/Airways    Name:   Placement date:   Placement time:   Site:   Days:   Peripheral IV 08/22/17 Left Antecubital   08/22/17    1751    Antecubital   less than 1   Urethral Catheter Latex   04/04/11    1526    Latex   2332   Incision 04/04/11 Abdomen Other (Comment)   04/04/11    1549     2332   Incision - 4 Ports Abdomen 1: Medial 2: Left;Lower;Lateral 3: Left;Mid;Lateral 4: Left;Lateral;Upper   04/04/11    --     2332          Labs/Imaging Results for orders placed or  performed during the hospital encounter of 08/22/17 (from the past 48 hour(s))  Urine rapid drug screen (hosp performed)     Status: None   Collection Time: 08/22/17  5:16 PM  Result Value Ref Range   Opiates NONE DETECTED NONE DETECTED   Cocaine NONE DETECTED NONE DETECTED   Benzodiazepines NONE DETECTED NONE DETECTED   Amphetamines NONE DETECTED NONE DETECTED   Tetrahydrocannabinol NONE DETECTED NONE DETECTED   Barbiturates NONE DETECTED NONE DETECTED    Comment: (NOTE) DRUG SCREEN FOR MEDICAL PURPOSES ONLY.  IF CONFIRMATION IS NEEDED FOR ANY PURPOSE, NOTIFY LAB WITHIN 5 DAYS. LOWEST DETECTABLE LIMITS FOR URINE DRUG SCREEN Drug Class                     Cutoff (ng/mL) Amphetamine and metabolites    1000 Barbiturate and metabolites    200 Benzodiazepine                 287 Tricyclics and metabolites     300 Opiates and metabolites        300 Cocaine and metabolites        300 THC  50 Performed at [facility], [place] [address] [place], [place] [address]   Urinalysis, Routine w reflex microscopic     Status: Abnormal   Collection Time: 08/22/17  5:16 PM  Result Value Ref Range   Color, Urine STRAW (A) YELLOW   APPearance CLEAR CLEAR   Specific Gravity, Urine 1.004 (L) 1.005 - 1.030   pH 7.0 5.0 - 8.0   Glucose, UA NEGATIVE NEGATIVE mg/dL   Hgb urine dipstick NEGATIVE NEGATIVE   Bilirubin Urine NEGATIVE NEGATIVE   Ketones, ur NEGATIVE NEGATIVE mg/dL   Protein, ur NEGATIVE NEGATIVE mg/dL   Nitrite NEGATIVE NEGATIVE   Leukocytes, UA TRACE (A) NEGATIVE   RBC / HPF 0-5 0 - 5 RBC/hpf   WBC, UA 6-10 0 - 5 WBC/hpf   Bacteria, UA FEW (A) NONE SEEN   Squamous Epithelial / LPF 0-5 0 - 5    Comment: Performed at [facility], [place] [address] [place], [place] [address]  Ethanol     Status: None   Collection Time: 08/22/17  5:47 PM  Result Value Ref Range   Alcohol, Ethyl (B) <10 <10 mg/dL    Comment: (NOTE) Lowest  detectable limit for serum alcohol is 10 mg/dL. For medical purposes only. Performed at [facility], [place] [address] [place], [place] [address]   Protime-INR     Status: None   Collection Time: 08/22/17  5:47 PM  Result Value Ref Range   Prothrombin Time 12.1 11.4 - 15.2 seconds   INR 0.90     Comment: Performed at [facility], [place] [address] [place], [place] [address]  APTT     Status: None   Collection Time: 08/22/17  5:47 PM  Result Value Ref Range   aPTT 28 24 - 36 seconds    Comment: Performed at [facility], [place] [address], [place], [place] [address]  CBC     Status: None   Collection Time: 08/22/17  5:47 PM  Result Value Ref Range   WBC 7.8 4.0 - 10.5 K/uL   RBC 4.55 3.87 - 5.11 MIL/uL   Hemoglobin 12.4 12.0 - 15.0 g/dL   HCT 37.7 36.0 - 46.0 %   MCV 82.9 78.0 - 100.0 fL   MCH 27.3 26.0 - 34.0 pg   MCHC 32.9 30.0 - 36.0 g/dL   RDW 15.5 11.5 - 15.5 %   Platelets 238 150 - 400 K/uL    Comment: Performed at [facility], [place] [address], [place], [place] [address]  Differential     Status: None   Collection Time: 08/22/17  5:47 PM  Result Value Ref Range   Neutrophils Relative % 65 %   Neutro Abs 5.0 1.7 - 7.7 K/uL   Lymphocytes Relative 24 %   Lymphs Abs 1.9 0.7 - 4.0 K/uL   Monocytes Relative 10 %   Monocytes Absolute 0.8 0.1 - 1.0 K/uL   Eosinophils Relative 1 %   Eosinophils Absolute 0.1 0.0 - 0.7 K/uL   Basophils Relative 0 %   Basophils Absolute 0.0 0.0 - 0.1 K/uL    Comment: Performed at [facility], [place] [address] [place], [place] [address]  Comprehensive metabolic panel     Status: Abnormal   Collection Time: 08/22/17  5:47 PM  Result Value Ref Range   Sodium 139 135 - 145 mmol/L   Potassium 3.3 (L) 3.5 - 5.1 mmol/L   Chloride 102 98 - 111  mmol/L   CO2 28 22 - 32 mmol/L   Glucose, Bld 164 (H) 70 - 99 mg/dL   BUN 11 8 - 23 mg/dL   Creatinine, Ser 0.60  0.44 - 1.00 mg/dL   Calcium 9.9 8.9 - 10.3 mg/dL   Total Protein 7.6 6.5 - 8.1 g/dL   Albumin 4.2 3.5 - 5.0 g/dL   AST 21 15 - 41 U/L   ALT 13 0 - 44 U/L   Alkaline Phosphatase 62 38 - 126 U/L   Total Bilirubin 1.1 0.3 - 1.2 mg/dL   GFR calc non Af Amer >60 >60 mL/min   GFR calc Af Amer >60 >60 mL/min    Comment: (NOTE) The eGFR has been calculated using the CKD EPI equation. This calculation has not been validated in all clinical situations. eGFR's persistently <60 mL/min signify possible Chronic Kidney Disease.    Anion gap 9 5 - 15    Comment: Performed at [facility], [place] [address], [place], [place] [address]  I-Stat Chem 8, ED     Status: Abnormal   Collection Time: 08/22/17  6:02 PM  Result Value Ref Range   Sodium 140 135 - 145 mmol/L   Potassium 3.3 (L) 3.5 - 5.1 mmol/L   Chloride 101 98 - 111 mmol/L   BUN 10 8 - 23 mg/dL   Creatinine, Ser 0.60 0.44 - 1.00 mg/dL   Glucose, Bld 163 (H) 70 - 99 mg/dL   Calcium, Ion 1.18 1.15 - 1.40 mmol/L   TCO2 28 22 - 32 mmol/L   Hemoglobin 12.9 12.0 - 15.0 g/dL   HCT 38.0 36.0 - 46.0 %  I-stat troponin, ED     Status: None   Collection Time: 08/22/17  6:03 PM  Result Value Ref Range   Troponin i, poc 0.02 0.00 - 0.08 ng/mL   Comment 3            Comment: Due to the release kinetics of cTnI, a negative result within the first hours of the onset of symptoms does not rule out myocardial infarction with certainty. If myocardial infarction is still suspected, repeat the test at appropriate intervals.    Dg Chest 2 View  Result Date: 08/22/2017 CLINICAL DATA:  TIA, slurred speech, confusion.  Hypertension EXAM: CHEST - 2 VIEW COMPARISON:  None. FINDINGS: Heart and mediastinal contours are within normal limits. No focal opacities or effusions. No acute bony abnormality. IMPRESSION: No active cardiopulmonary disease. Electronically Signed   By: Rolm Baptise M.D.   On: 08/22/2017 20:47   Ct Head Code Stroke Wo  Contrast  Result Date: 08/22/2017 CLINICAL DATA:  Code stroke. Left-sided facial droop with slurred speech. EXAM: CT HEAD WITHOUT CONTRAST TECHNIQUE: Contiguous axial images were obtained from the base of the skull through the vertex without intravenous contrast. COMPARISON:  None. FINDINGS: Brain: There is no mass, hemorrhage or extra-axial collection. The size and configuration of the ventricles and extra-axial CSF spaces are normal. Age indeterminate small vessel infarct of the right thalamus. There is hypoattenuation of the periventricular white matter, most commonly indicating chronic ischemic microangiopathy. Vascular: No abnormal hyperdensity of the major intracranial arteries or dural venous sinuses. No intracranial atherosclerosis. Skull: The visualized skull base, calvarium and extracranial soft tissues are normal. Sinuses/Orbits: No fluid levels or advanced mucosal thickening of the visualized paranasal sinuses. No mastoid or middle ear effusion. The orbits are normal. ASPECTS [facility] Stroke Program Early CT Score) - Ganglionic level infarction (caudate, lentiform nuclei, internal capsule,  insula, M1-M3 cortex): 7 - Supraganglionic infarction (M4-M6 cortex): 3 Total score (0-10 with 10 being normal): 10 IMPRESSION: 1. No acute hemorrhage. 2. Age-indeterminate small vessel infarct of the right thalamus. MRI could be obtained for better temporal characterization, if indicated. 3. ASPECTS is 10. 4. These results were called by telephone at the time of interpretation on 08/22/2017 at 5:46 pm to Dr. Merrily Pew , who verbally acknowledged these results. Electronically Signed   By: Ulyses Jarred M.D.   On: 08/22/2017 17:46    Pending Labs Unresulted Labs (From admission, onward)   Start     Ordered   08/23/17 0500  Hemoglobin A1c  Tomorrow morning,   R     08/22/17 2018   08/23/17 0500  Lipid panel  Tomorrow morning,   R    Comments:  Fasting    08/22/17 2018   08/22/17 1942  Urine culture  STAT,    STAT     08/22/17 1941      Vitals/Pain Today's Vitals   08/22/17 1830 08/22/17 1900 08/22/17 1930 08/22/17 2000  BP: (!) 153/78 (!) 152/71 (!) 165/76 (!) 176/127  Pulse: 80 84 62 79  Resp: _0 Temp:      TempSrc:      SpO2: 99% 97% 97% 99%    Isolation Precautions No active isolations  Medications Medications   stroke: mapping our early stages of recovery book (has no administration in time range)  acetaminophen (TYLENOL) tablet 650 mg (has no administration in time range)    Or  acetaminophen (TYLENOL) solution 650 mg (has no administration in time range)    Or  acetaminophen (TYLENOL) suppository 650 mg (has no administration in time range)  senna-docusate (Senokot-S) tablet 1 tablet (has no administration in time range)  enoxaparin (LOVENOX) injection 40 mg (has no administration in time range)  aspirin suppository 300 mg (has no administration in time range)    Or  aspirin tablet 325 mg (has no administration in time range)  calcium carbonate (OS-CAL) tablet 600 mg (has no administration in time range)  dorzolamide-timolol (COSOPT) 22.3-6.8 MG/ML ophthalmic solution 1 drop (has no administration in time range)  levothyroxine (SYNTHROID, LEVOTHROID) tablet 150 mcg (has no administration in time range)  pravastatin (PRAVACHOL) tablet 10 mg (has no administration in time range)  metFORMIN (GLUCOPHAGE) tablet 1,000 mg (has no administration in time range)  prednisoLONE acetate (PRED FORTE) 1 % ophthalmic suspension 1 drop (has no administration in time range)    Mobility walks

## 2017-08-23 ENCOUNTER — Observation Stay (HOSPITAL_BASED_OUTPATIENT_CLINIC_OR_DEPARTMENT_OTHER): Payer: Medicare HMO

## 2017-08-23 ENCOUNTER — Observation Stay (HOSPITAL_COMMUNITY): Payer: Medicare HMO

## 2017-08-23 ENCOUNTER — Other Ambulatory Visit: Payer: Self-pay

## 2017-08-23 DIAGNOSIS — E119 Type 2 diabetes mellitus without complications: Secondary | ICD-10-CM | POA: Diagnosis not present

## 2017-08-23 DIAGNOSIS — I5030 Unspecified diastolic (congestive) heart failure: Secondary | ICD-10-CM

## 2017-08-23 DIAGNOSIS — G459 Transient cerebral ischemic attack, unspecified: Secondary | ICD-10-CM

## 2017-08-23 DIAGNOSIS — I10 Essential (primary) hypertension: Secondary | ICD-10-CM | POA: Diagnosis not present

## 2017-08-23 LAB — GLUCOSE, CAPILLARY
GLUCOSE-CAPILLARY: 102 mg/dL — AB (ref 70–99)
Glucose-Capillary: 120 mg/dL — ABNORMAL HIGH (ref 70–99)
Glucose-Capillary: 91 mg/dL (ref 70–99)

## 2017-08-23 LAB — ECHOCARDIOGRAM COMPLETE
Height: 64 in
Weight: 2402.1322 oz

## 2017-08-23 MED ORDER — CLOPIDOGREL BISULFATE 75 MG PO TABS
75.0000 mg | ORAL_TABLET | Freq: Every day | ORAL | Status: DC
  Administered 2017-08-23 – 2017-08-24 (×2): 75 mg via ORAL
  Filled 2017-08-23 (×2): qty 1

## 2017-08-23 NOTE — Progress Notes (Signed)
  Echocardiogram 2D Echocardiogram has been performed.  Priscilla SavoyCasey N Patricio Tran 08/23/2017, 10:30 AM

## 2017-08-23 NOTE — Progress Notes (Signed)
VASCULAR LAB PRELIMINARY  PRELIMINARY  PRELIMINARY  PRELIMINARY  Carotid duplex completed.    Preliminary report:  1-39% ICA plaquing. Vertebral artery flow is antegrade.   Gorden Stthomas, RVT 08/23/2017, 10:41 AM

## 2017-08-23 NOTE — Evaluation (Signed)
Physical Therapy Evaluation & Discharge Patient Details Name: Priscilla Tran MRN: [id number] DOB: 1935-04-28 Today's Date: 08/23/2017   History of Present Illness  Pt is a 82 y.o. female admitted 08/22/17 with transient facial droop, confusion, slurred speech; symptoms most consistent with TIA. Head CT shows no definite acute findings. PMH includes HTN, DM, HLD.     Clinical Impression  Patient evaluated by Physical Therapy with no further acute PT needs identified. PTA, pt mod indep with intermittent use of SPC; lives with daughter. Today, pt mod indep with SPC for mobility and ADLs. Reviewed stroke symptoms and fall risk reduction. All education has been completed and the patient has no further questions. PT is signing off. Thank you for this referral.    Follow Up Recommendations No PT follow up;Supervision - Intermittent    Equipment Recommendations  None recommended by PT    Recommendations for Other Services       Precautions / Restrictions Precautions Precautions: None Restrictions Weight Bearing Restrictions: No      Mobility  Bed Mobility Overal bed mobility: Independent                Transfers Overall transfer level: Independent Equipment used: None                Ambulation/Gait Ambulation/Gait assistance: Modified independent (Device/Increase time) Gait Distance (Feet): 400 Feet Assistive device: Straight cane Gait Pattern/deviations: Step-through pattern;Decreased stride length;Trunk flexed Gait velocity: Decreased Gait velocity interpretation: 1.31 - 2.62 ft/sec, indicative of limited community ambulator General Gait Details: Slow, steady amb mod indep with SPC; pt intermittently reaching to side rail for UE support as well  Careers information officer    Modified Rankin (Stroke Patients Only) Modified Rankin (Stroke Patients Only) Pre-Morbid Rankin Score: No symptoms Modified Rankin: No symptoms     Balance Overall  balance assessment: Needs assistance   Sitting balance-Leahy Scale: Good       Standing balance-Leahy Scale: Good               High level balance activites: Side stepping;Direction changes;Turns;Backward walking;Head turns High Level Balance Comments: 1x self-corrected instability with turning/looking backwards             Pertinent Vitals/Pain Pain Assessment: No/denies pain    Home Living Family/patient expects to be discharged to:: Private residence Living Arrangements: Children Available Help at Discharge: Family;Available 24 hours/day Type of Home: House Home Access: Level entry     Home Layout: One level Home Equipment: Walker - 2 wheels;Cane - single point      Prior Function Level of Independence: Independent with assistive device(s)         Comments: Mod indep with SPC for outdoors/community ambulation     Hand Dominance        Extremity/Trunk Assessment   Upper Extremity Assessment Upper Extremity Assessment: Overall WFL for tasks assessed    Lower Extremity Assessment Lower Extremity Assessment: Overall WFL for tasks assessed    Cervical / Trunk Assessment Cervical / Trunk Assessment: Kyphotic  Communication   Communication: HOH  Cognition Arousal/Alertness: Awake/alert Behavior During Therapy: WFL for tasks assessed/performed Overall Cognitive Status: Within Functional Limits for tasks assessed                                        General Comments  Exercises     Assessment/Plan    PT Assessment Patent does not need any further PT services  PT Problem List         PT Treatment Interventions      PT Goals (Current goals can be found in the Care Plan section)  Acute Rehab PT Goals PT Goal Formulation: All assessment and education complete, DC therapy    Frequency     Barriers to discharge        Co-evaluation               AM-PAC PT "6 Clicks" Daily Activity  Outcome Measure  Difficulty turning over in bed (including adjusting bedclothes, sheets and blankets)?: None Difficulty moving from lying on back to sitting on the side of the bed? : None Difficulty sitting down on and standing up from a chair with arms (e.g., wheelchair, bedside commode, etc,.)?: None Help needed moving to and from a bed to chair (including a wheelchair)?: None Help needed walking in hospital room?: None Help needed climbing 3-5 steps with a railing? : A Little 6 Click Score: 23    End of Session Equipment Utilized During Treatment: Gait belt Activity Tolerance: Patient tolerated treatment well Patient left: with call bell/phone within reach;in bed Nurse Communication: Mobility status PT Visit Diagnosis: Other abnormalities of gait and mobility (R26.89)    Time: 0912-0927 PT Time Calculation (min) (ACUTE ONLY): 15 min   Charges:   PT Evaluation $PT Eval Low Complexity: 1 Low         Ina HomesJaclyn Crysten Kaman, PT, DPT Acute Rehab Services  Pager: [phone]  Malachy ChamberJaclyn L Adamarys Shall 08/23/2017, 9:35 AM

## 2017-08-23 NOTE — Progress Notes (Signed)
OT Cancellation Note and Discharge  Patient Details Name: Priscilla GowdaRita L Clemons MRN: [id number] DOB: [birth year]   Cancelled Treatment:    Reason Eval/Treat Not Completed: Spoke with PT who states Pt is mod I with SPC for all ADL - cognition WFL, good support from family. OT screened, no needs identified, will sign off. Thank you for this referral.   Emelda FearLaura J Dhrithi Riche 08/23/2017, 10:16 AM  Sherryl MangesLaura Shimika Ames OTR/L [phone]

## 2017-08-23 NOTE — Consult Note (Signed)
Neurology Consultation Reason for Consult: Facial Droop Referring Physician: Laban Emperor  CC: Facial droop  History is obtained from:patient.   HPI: Priscilla Tran is a 82 y.o. female with a history of diabetes, hypertension, hyperlipidemia who presents with transient facial droop, confusion, dysarthria.  She was writing with her daughter earlier when her daughter noticed that she seemed to be slightly confused, not really sure what she would get at [facility]. She also seemed to have difficulty remembering the name of someone. Her daughter then looked over at her and noticed that her left side of her face was drooping, and her speech became noticeably slurred. The entirety of the episode lasted for about 20 minutes.   LKW: 1530 tpa given?: no, resolution of symptoms Premorbid modified rankin scale: 0    ROS: A 14 point ROS was performed and is negative except as noted in the HPI.   Past Medical History:  Diagnosis Date  . Diabetes mellitus   . Disc degeneration, lumbar    Dr. Regino Schultze  . Glaucoma associated with ocular inflammation, severe stage    blind Right eye  . Hyperlipidemia   . Hypertension   . Hypothyroid   . SBO (small bowel obstruction) s/p lap LOA [id number] 03/31/2011     Family History  Problem Relation Age of Onset  . Heart disease Mother   . Heart attack Mother   . Heart disease Father   . Heart attack Father   . Cancer Brother        kidney     Social History:  reports that she has never smoked. She has never used smokeless tobacco. She reports that she drinks alcohol. She reports that she does not use drugs.   Exam: Current vital signs: BP (!) 176/127   Pulse 79   Temp 99.8 F (37.7 C) (Oral)   Resp 16   SpO2 99%  Vital signs in last 24 hours: Temp:  [99.8 F (37.7 C)] 99.8 F (37.7 C) (07/27 1653) Pulse Rate:  [62-92] 79 (07/27 2000) Resp:  [12-21] 16 (07/27 2000) BP: (152-209)/(71-127) 176/127 (07/27 2000) SpO2:  [96 %-100 %] 99 % (07/27  2000)   Physical Exam  Constitutional: Appears well-developed and well-nourished.  Psych: Affect appropriate to situation Eyes: No scleral injection HENT: No OP obstrucion Head: Normocephalic.  Cardiovascular: Normal rate and regular rhythm.  Respiratory: Effort normal, non-labored breathing GI: Soft.  No distension. There is no tenderness.  Skin: WDI  Neuro: Mental Status: Patient is awake, alert, oriented to person, place, month, year, and situation. Patient is able to give a clear and coherent history. No signs of aphasia or neglect Cranial Nerves: II: Visual Fields are full. Cataract obscures pupil on the right, post-surgical non-responsive pupil on the left  III,IV, VI: EOMI without ptosis or diploplia.  V: Facial sensation is symmetric to temperature VII: Facial movement is symmetric.  VIII: hearing is intact to voice X: Uvula elevates symmetrically XI: Shoulder shrug is symmetric. XII: tongue is midline without atrophy or fasciculations.  Motor: Tone is normal. Bulk is normal. 5/5 strength was present in all four extremities.  Sensory: Sensation is symmetric to light touch and temperature in the arms and legs. Cerebellar: FNF intact bilaterally  I have reviewed labs in epic and the results pertinent to this consultation are: CMP - unremarkable  I have reviewed the images obtained: CT head - no definite acute findigns  Impression: 82 yo F with transient confusion, facial droop, dysarthria. This is most consistent  with TIA in a patient with multiple risk factors. In this time in favor treating as such.  Recommendations: - HgbA1c, fasting lipid panel - MRI, MRA  of the brain without contrast - Frequent neuro checks - Echocardiogram - Carotid dopplers - Prophylactic therapy-Antiplatelet med: Aspirin - dose 325mg  PO or 300mg  PR - Risk factor modification - Telemetry monitoring - PT consult, OT consult, Speech consult - Stroke team to follow   Ritta SlotMcNeill  Christoffer Currier, MD [facility] [phone]  If 7pm- 7am, please page neurology on call as listed in AMION.

## 2017-08-23 NOTE — Progress Notes (Signed)
STROKE TEAM PROGRESS NOTE   HISTORY OF PRESENT ILLNESS (per record) Asencion GowdaRita L Esham is a 82 y.o. female with a history of diabetes, hypertension, hyperlipidemia who presents with transient facial droop, confusion, dysarthria.  She was writing with her daughter earlier when her daughter noticed that she seemed to be slightly confused, [facility] She also seemed to have difficulty remembering the name of someone. Her daughter then looked over at her and noticed that her left side of her face was drooping, and her speech became noticeably slurred. The entirety of the episode lasted for about 20 minutes.   LKW: 1530 tpa given?: no, resolution of symptoms Premorbid modified rankin scale: 0    SUBJECTIVE (INTERVAL HISTORY) Back to baseline.  MRI no acute infarct. There is subcortical small vessel ischemic disease.  MRA Brain showed no stenosis except in left PCA distally, which is unrelated to presentation.   CDUS is negative.  TTE is pending.    She says she was taking ASA 81 mg qd at home before this happened and did not miss any doses.      OBJECTIVE Temp:  [97.6 F (36.4 C)-99.8 F (37.7 C)] 97.6 F (36.4 C) (07/28 0512) Pulse Rate:  [62-92] 70 (07/28 0512) Cardiac Rhythm: Normal sinus rhythm (07/28 0700) Resp:  [12-21] 17 (07/28 0512) BP: (139-209)/(65-127) 139/71 (07/28 0512) SpO2:  [96 %-100 %] 97 % (07/28 0512) Weight:  [150 lb 2.1 oz (68.1 kg)] 150 lb 2.1 oz (68.1 kg) (07/28 0139)  CBC:  Recent Labs  Lab 08/22/17 1747 08/22/17 1802  WBC 7.8  --   NEUTROABS 5.0  --   HGB 12.4 12.9  HCT 37.7 38.0  MCV 82.9  --   PLT 238  --     Basic Metabolic Panel:  Recent Labs  Lab 08/22/17 1747 08/22/17 1802  NA 139 140  K 3.3* 3.3*  CL 102 101  CO2 28  --   GLUCOSE 164* 163*  BUN 11 10  CREATININE 0.60 0.60  CALCIUM 9.9  --     Lipid Panel: No results found for: CHOL, TRIG, HDL, CHOLHDL, VLDL, LDLCALC HgbA1c:  Lab Results   Component Value Date   HGBA1C 7.0 (H) 04/02/2011   Urine Drug Screen:     Component Value Date/Time   LABOPIA NONE DETECTED 08/22/2017 1716   COCAINSCRNUR NONE DETECTED 08/22/2017 1716   LABBENZ NONE DETECTED 08/22/2017 1716   AMPHETMU NONE DETECTED 08/22/2017 1716   THCU NONE DETECTED 08/22/2017 1716   LABBARB NONE DETECTED 08/22/2017 1716    Alcohol Level     Component Value Date/Time   ETH <10 08/22/2017 1747    IMAGING   Dg Chest 2 View 08/22/2017 IMPRESSION:  No active cardiopulmonary disease.    Ct Head Code Stroke Wo Contrast  08/22/2017 IMPRESSION:  1. No acute hemorrhage.  2. Age-indeterminate small vessel infarct of the right thalamus. MRI could be obtained for better temporal characterization, if indicated.  3. ASPECTS is 10.    MRI / MRA Head Wo Contrast 08/23/2017 IMPRESSION: 1. No acute intracranial abnormality to account for the patient's symptoms. 2. Diffuse white matter changes are mildly advanced for age. This likely reflects the sequela of chronic microvascular ischemia. 3. MRA circle-of-Willis demonstrates a high-grade stenosis in the superior left P3 segment. 4. Other more distal small vessel disease without significant proximal stenosis, aneurysm, or branch vessel occlusion otherwise. 5. Loss of normal T1 marrow signal at the dens. While  this is most likely degenerative, trauma or neoplasm is not excluded on the basis of these films. Recommend non emergent follow-up CT scan of the cervical spine for further evaluation.    Transthoracic Echocardiogram - pending 00/00/00    Bilateral Carotid Dopplers - pending 00/00/00     PHYSICAL EXAM Vitals:   08/23/17 0139 08/23/17 0311 08/23/17 0312 08/23/17 0512  BP:   (!) 145/65 139/71  Pulse:  67 62 70  Resp:  20  17  Temp:   98.2 F (36.8 C) 97.6 F (36.4 C)  TempSrc:   Oral Oral  SpO2:   97% 97%  Weight: 150 lb 2.1 oz (68.1 kg)     Height: 5\' 4"  (1.626 m)       Awake, alert, fully  oriented.   Language - fluent.  Comprehension, naming, repetition- intact. Face symmetric.  Tongue midline. Motor 5/5 BUE and BLE. Sensory - intact. Coord- intact.  No babinski.         ASSESSMENT/PLAN Ms. PAULETTE ROCKFORD is a 82 y.o. female with history of small bowel obstruction, hypothyroidism, hypertension, hyperlipidemia, and diabetes mellitus presenting with transient facial droop, dysarthria, confusion. She did not receive IV t-PA due to resolution of deficits.  Possible TIA:    Resultant resolution of deficits  CT head -no acute hemorrhage  MRI head - no acute intracranial abnormality  MRA head - high-grade stenosis in the superior left P3 segment.  Carotid Doppler - pending  2D Echo - pending  LDL - pending  HgbA1c - - pending  VTE prophylaxis -  - Lovenox Diet Order           Diet Carb Modified Fluid consistency: Thin; Room service appropriate? Yes  Diet effective now           aspirin 81 mg daily prior to admission, now on aspirin 325 mg daily  Patient counseled to be compliant with her antithrombotic medications  Ongoing aggressive stroke risk factor management  Therapy recommendations:  pending  Disposition:  Pending  Hypertension  Stable . Permissive hypertension (OK if < 220/120) but gradually normalize in 5-7 days . Long-term BP goal normotensive  Hyperlipidemia  Lipid lowering medication PTA: Mevacor 10 mg daily  LDL pending, goal < 70  Current lipid lowering medication: Pravachol 10 mg daily  Continue statin at discharge  Diabetes  HgbA1c pending, goal < 7.0  Unc / Controlled  Other Stroke Risk Factors  Advanced age  ETOH use, advised to drink no more than 1 alcoholic beverage per day.   Other Active Problems  Mild hypokalemia - 3.3  Loss of normal T1 marrow signal at the dens. Recommend non emergent follow-up CT scan of the cervical spine for further evaluation.      Hospital day # 0  TIA involving left  frontal lobe.  She has failed ASA.  I will switch to Plavix.  Etiology is likely small vessel related, but cannot completely rule out cardioembolism.  Awaiting TTE.  Tele so far NSR.  LDL pending.  On statin.  HgA1c pending.    Weston Settle, MS, MD  To contact Stroke Continuity provider, please refer to [email or web address]. After hours, contact General Neurology

## 2017-08-23 NOTE — Progress Notes (Signed)
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  Symptoms have resolved by the time she was evaluated in the emergency department.  She was hospitalized for further management.  Reason for Visit: TIA  Consultants: Neurology  Procedures:   Carotid Dopplers No significant stenosis noted.  Transthoracic echocardiogram Study Conclusions  - Left ventricle: The cavity size was normal. Wall thickness was   normal. Systolic function was normal. The estimated ejection   fraction was in the range of 60% to 65%. Wall motion was normal;   there were no regional wall motion abnormalities. Doppler   parameters are consistent with abnormal left ventricular   relaxation (grade 1 diastolic dysfunction). The E/e&' ratio is   between 8-15, suggesting indeterminate LV filling pressure. - Mitral valve: Mildly thickened leaflets . There was trivial   regurgitation. - Left atrium: The atrium was normal in size. - Atrial septum: No defect or patent foramen ovale was identified. - Inferior vena cava: The vessel was normal in size. The   respirophasic diameter changes were in the normal range (>= 50%),   consistent with normal central venous pressure.  Impressions:  - LVEF 60-65%, normal wall thickness, normal wall motion, grade 1   DD, indeterminate LV filling pressure, trivial MR, normal LA   size, negative for PFO by color doppler, normal IVC.   Antibiotics: None  Subjective/Interval History: Patient denies any complaints this morning.  She states that her speech is back to normal.  Denies any weakness on any one side of her  body.  ROS: Denies any nausea or vomiting  Objective:  Vital Signs  Vitals:   08/23/17 0312 08/23/17 0512 08/23/17 0944 08/23/17 1230  BP: (!) 145/65 139/71 (!) 179/85 (!) 153/70  Pulse: 62 70  (!) 58  Resp:  17 15 18   Temp: 98.2 F (36.8 C) 97.6 F (36.4 C)  98.1 F (36.7 C)  TempSrc: Oral Oral  Oral  SpO2: 97% 97%  98%  Weight:      Height:        Intake/Output Summary (Last 24 hours) at 08/23/2017 1252 Last data filed at 08/23/2017 0105 Gross per 24 hour  Intake 120 ml  Output -  Net 120 ml   Filed Weights   08/23/17 0139  Weight: 68.1 kg (150 lb 2.1 oz)    General appearance: alert, cooperative, appears stated age and no distress Head: Normocephalic, without obvious abnormality, atraumatic Resp: clear to auscultation bilaterally Cardio: regular rate and rhythm, S1, S2 normal, no murmur, click, rub or gallop GI: soft, non-tender; bowel sounds normal; no masses,  no organomegaly Extremities: extremities normal, atraumatic, no cyanosis or edema Pulses: 2+ and symmetric Neurologic: No focal deficits at this time.  Cranial nerves II to XII intact.  Motor strength equal bilateral upper and lower extremities.  No pronator drift.  Lab Results:  Data Reviewed: I have personally reviewed following labs and imaging studies  CBC: Recent Labs  Lab 08/22/17 1747 08/22/17 1802  WBC 7.8  --   NEUTROABS 5.0  --   HGB 12.4 12.9  HCT 37.7 38.0  MCV 82.9  --   PLT 238  --     Basic  Metabolic Panel: Recent Labs  Lab 08/22/17 1747 08/22/17 1802  NA 139 140  K 3.3* 3.3*  CL 102 101  CO2 28  --   GLUCOSE 164* 163*  BUN 11 10  CREATININE 0.60 0.60  CALCIUM 9.9  --     GFR: Estimated Creatinine Clearance: 54.1 mL/min (by C-G formula based on SCr of 0.6 mg/dL).  Liver Function Tests: Recent Labs  Lab 08/22/17 1747  AST 21  ALT 13  ALKPHOS 62  BILITOT 1.1  PROT 7.6  ALBUMIN 4.2    Coagulation Profile: Recent Labs  Lab 08/22/17 1747  INR 0.90     CBG: Recent Labs  Lab 08/22/17 2244 08/23/17 0629 08/23/17 1122  GLUCAP 138* 120* 91     Radiology Studies: Dg Chest 2 View  Result Date: 08/22/2017 CLINICAL DATA:  TIA, slurred speech, confusion.  Hypertension EXAM: CHEST - 2 VIEW COMPARISON:  None. FINDINGS: Heart and mediastinal contours are within normal limits. No focal opacities or effusions. No acute bony abnormality. IMPRESSION: No active cardiopulmonary disease. Electronically Signed   By: Charlett Nose M.D.   On: 08/22/2017 20:47   Mr Brain Wo Contrast  Result Date: 08/23/2017 CLINICAL DATA:  TIA.  Facial droop. EXAM: MRI HEAD WITHOUT CONTRAST MRA HEAD WITHOUT CONTRAST TECHNIQUE: Multiplanar, multiecho pulse sequences of the brain and surrounding structures were obtained without intravenous contrast. Angiographic images of the head were obtained using MRA technique without contrast. COMPARISON:  CT head without contrast 08/22/2017. FINDINGS: MRI HEAD FINDINGS Brain: The diffusion-weighted images demonstrate no acute or subacute infarct to explain the patient's symptoms. Periventricular and subcortical T2 changes bilaterally are mildly advanced for age. There is mild generalized atrophy. White matter changes extend into the brainstem. Cerebellum is unremarkable. The lesion in the right thalamus is remote. No acute hemorrhage or mass lesion is present. The ventricles are proportionate to the degree of atrophy. No significant extra-axial fluid collection is present. Vascular: Flow is present in the major intracranial arteries. Skull and upper cervical spine: The skull base is within normal limits. There is loss of normal marrow signal at the dens. This may be degenerative. There is no displacement. Degenerative changes are present at C3-4 and C4-5. Marrow signal is otherwise normal. Sinuses/Orbits: The paranasal sinuses clear. Scleral banding is present at the right globe. Bilateral lens replacements are noted. Globes and orbits are  otherwise within normal limits. MRA HEAD FINDINGS Mild atherosclerotic changes are present in the cavernous internal carotid arteries bilaterally. There is no significant stenosis relative to the ICA termini. Prominent posterior communicating arteries are present bilaterally. The A1 and M1 segments are normal. No definite anterior communicating artery is visualized. MCA bifurcations are within normal limits bilaterally. ACA and MCA branch vessels are normal. The right vertebral artery is the dominant vessel. Left PICA origin is visualized and normal. The right AICA is dominant. The basilar artery is small, terminating at the superior cerebellar arteries bilaterally. Posterior cerebral arteries are of fetal type bilaterally. There is a high-grade stenosis involving the superior left P3 division. IMPRESSION: 1. No acute intracranial abnormality to account for the patient's symptoms. 2. Diffuse white matter changes are mildly advanced for age. This likely reflects the sequela of chronic microvascular ischemia. 3. MRA circle-of-Willis demonstrates a high-grade stenosis in the superior left P3 segment. 4. Other more distal small vessel disease without significant proximal stenosis, aneurysm, or branch vessel occlusion otherwise. 5. Loss of normal T1 marrow signal at the dens. While this is most likely degenerative, trauma  or neoplasm is not excluded on the basis of these films. Recommend non emergent follow-up CT scan of the cervical spine for further evaluation. Electronically Signed   By: Marin Robertshristopher  Mattern M.D.   On: 08/23/2017 09:56   Mr Maxine GlennMra Head Wo Contrast  Result Date: 08/23/2017 CLINICAL DATA:  TIA.  Facial droop. EXAM: MRI HEAD WITHOUT CONTRAST MRA HEAD WITHOUT CONTRAST TECHNIQUE: Multiplanar, multiecho pulse sequences of the brain and surrounding structures were obtained without intravenous contrast. Angiographic images of the head were obtained using MRA technique without contrast. COMPARISON:  CT head  without contrast 08/22/2017. FINDINGS: MRI HEAD FINDINGS Brain: The diffusion-weighted images demonstrate no acute or subacute infarct to explain the patient's symptoms. Periventricular and subcortical T2 changes bilaterally are mildly advanced for age. There is mild generalized atrophy. White matter changes extend into the brainstem. Cerebellum is unremarkable. The lesion in the right thalamus is remote. No acute hemorrhage or mass lesion is present. The ventricles are proportionate to the degree of atrophy. No significant extra-axial fluid collection is present. Vascular: Flow is present in the major intracranial arteries. Skull and upper cervical spine: The skull base is within normal limits. There is loss of normal marrow signal at the dens. This may be degenerative. There is no displacement. Degenerative changes are present at C3-4 and C4-5. Marrow signal is otherwise normal. Sinuses/Orbits: The paranasal sinuses clear. Scleral banding is present at the right globe. Bilateral lens replacements are noted. Globes and orbits are otherwise within normal limits. MRA HEAD FINDINGS Mild atherosclerotic changes are present in the cavernous internal carotid arteries bilaterally. There is no significant stenosis relative to the ICA termini. Prominent posterior communicating arteries are present bilaterally. The A1 and M1 segments are normal. No definite anterior communicating artery is visualized. MCA bifurcations are within normal limits bilaterally. ACA and MCA branch vessels are normal. The right vertebral artery is the dominant vessel. Left PICA origin is visualized and normal. The right AICA is dominant. The basilar artery is small, terminating at the superior cerebellar arteries bilaterally. Posterior cerebral arteries are of fetal type bilaterally. There is a high-grade stenosis involving the superior left P3 division. IMPRESSION: 1. No acute intracranial abnormality to account for the patient's symptoms. 2.  Diffuse white matter changes are mildly advanced for age. This likely reflects the sequela of chronic microvascular ischemia. 3. MRA circle-of-Willis demonstrates a high-grade stenosis in the superior left P3 segment. 4. Other more distal small vessel disease without significant proximal stenosis, aneurysm, or branch vessel occlusion otherwise. 5. Loss of normal T1 marrow signal at the dens. While this is most likely degenerative, trauma or neoplasm is not excluded on the basis of these films. Recommend non emergent follow-up CT scan of the cervical spine for further evaluation. Electronically Signed   By: Marin Robertshristopher  Mattern M.D.   On: 08/23/2017 09:56   Ct Head Code Stroke Wo Contrast  Result Date: 08/22/2017 CLINICAL DATA:  Code stroke. Left-sided facial droop with slurred speech. EXAM: CT HEAD WITHOUT CONTRAST TECHNIQUE: Contiguous axial images were obtained from the base of the skull through the vertex without intravenous contrast. COMPARISON:  None. FINDINGS: Brain: There is no mass, hemorrhage or extra-axial collection. The size and configuration of the ventricles and extra-axial CSF spaces are normal. Age indeterminate small vessel infarct of the right thalamus. There is hypoattenuation of the periventricular white matter, most commonly indicating chronic ischemic microangiopathy. Vascular: No abnormal hyperdensity of the major intracranial arteries or dural venous sinuses. No intracranial atherosclerosis. Skull: The visualized skull base,  calvarium and extracranial soft tissues are normal. Sinuses/Orbits: No fluid levels or advanced mucosal thickening of the visualized paranasal sinuses. No mastoid or middle ear effusion. The orbits are normal. ASPECTS [facility] Stroke Program Early CT Score) - Ganglionic level infarction (caudate, lentiform nuclei, internal capsule, insula, M1-M3 cortex): 7 - Supraganglionic infarction (M4-M6 cortex): 3 Total score (0-10 with 10 being normal): 10 IMPRESSION: 1. No  acute hemorrhage. 2. Age-indeterminate small vessel infarct of the right thalamus. MRI could be obtained for better temporal characterization, if indicated. 3. ASPECTS is 10. 4. These results were called by telephone at the time of interpretation on 08/22/2017 at 5:46 pm to Dr. Marily Memos , who verbally acknowledged these results. Electronically Signed   By: Deatra Robinson M.D.   On: 08/22/2017 17:46     Medications:  Scheduled: .  stroke: mapping our early stages of recovery book   Does not apply Once  . calcium carbonate  1,250 mg Oral Daily  . clopidogrel  75 mg Oral Daily  . dorzolamide-timolol  1 drop Left Eye BID  . enoxaparin (LOVENOX) injection  40 mg Subcutaneous Q24H  . levothyroxine  150 mcg Oral QAC breakfast  . metFORMIN  1,000 mg Oral BID WC  . pravastatin  10 mg Oral q1800  . prednisoLONE acetate  1 drop Left Eye BID   Continuous:  UEA:[id number] **OR** acetaminophen (TYLENOL) oral liquid 160 mg/5 mL **OR** acetaminophen, senna-docusate  Assessment/Plan:    Transient ischemic attack Patient symptoms resolved in 20 minutes or so.  Consistent with TIA.  MRI does not show any stroke.  MRI does suggest stenosis in the superior left P3 segment of the circle of Willis.  Management per neurology.  Patient takes aspirin on a daily basis.  She has been changed over to Plavix.  Lipid panel and HbA1c is pending.  Patient is on a statin.  PT and OT evaluation.  Carotid Doppler as above.  Echocardiogram is as above.  Abnormal appearing Dens This was noted on the MRI.  CT scan was recommended and has been ordered.  Essential hypertension Allowing permissive hypertension for now.  Monitor blood pressures closely.  Diabetes mellitus type 2 SSI.  Metformin.  HbA1c is pending.  Hypothyroidism Continue home medications.  History of glaucoma Continue with eyedrops.  DVT Prophylaxis: Lovenox    Code Status: Full code Family Communication: Discussed with patient Disposition  Plan: Management as outlined above.  TIA work-up in progress.    LOS: 0 days   Osvaldo Shipper  [facility] Pager [phone] 08/23/2017, 12:52 PM  If 7PM-7AM, please contact night-coverage at [email or web address], password [facility]

## 2017-08-23 NOTE — Progress Notes (Signed)
Patient arrived via transport and admitted to 3W23. Patient was ambulatory with assistance to bed. Vital signs were set to be taken q2, patient was placed on telemetry, and assessment was performed. Patient and family was oriented to room and safety precautions. Concerns and questions were addressed at time of admission. Call bed within reach, bed alarm on, and bed in low position.

## 2017-08-24 DIAGNOSIS — G459 Transient cerebral ischemic attack, unspecified: Secondary | ICD-10-CM | POA: Diagnosis not present

## 2017-08-24 DIAGNOSIS — I10 Essential (primary) hypertension: Secondary | ICD-10-CM | POA: Diagnosis not present

## 2017-08-24 LAB — BASIC METABOLIC PANEL
ANION GAP: 14 (ref 5–15)
BUN: 7 mg/dL — AB (ref 8–23)
CO2: 25 mmol/L (ref 22–32)
Calcium: 9.5 mg/dL (ref 8.9–10.3)
Chloride: 102 mmol/L (ref 98–111)
Creatinine, Ser: 0.54 mg/dL (ref 0.44–1.00)
GFR calc Af Amer: >60 mL/min (ref >60)
GFR calc non Af Amer: >60 mL/min (ref >60)
GLUCOSE: 114 mg/dL — AB (ref 70–99)
POTASSIUM: 4.1 mmol/L (ref 3.5–5.1)
SODIUM: 141 mmol/L (ref 135–145)

## 2017-08-24 LAB — HEMOGLOBIN A1C
Hgb A1c MFr Bld: 6.3 % — ABNORMAL HIGH (ref 4.8–5.6)
MEAN PLASMA GLUCOSE: 134.11 mg/dL

## 2017-08-24 LAB — GLUCOSE, CAPILLARY
GLUCOSE-CAPILLARY: 91 mg/dL (ref 70–99)
GLUCOSE-CAPILLARY: 105 mg/dL — AB (ref 70–99)
Glucose-Capillary: 125 mg/dL — ABNORMAL HIGH (ref 70–99)

## 2017-08-24 LAB — LIPID PANEL
CHOLESTEROL: 180 mg/dL (ref 0–200)
HDL: 63 mg/dL (ref >40)
LDL Cholesterol: 97 mg/dL (ref 0–99)
Total CHOL/HDL Ratio: 2.9 RATIO
Triglycerides: 101 mg/dL (ref <150)
VLDL: 20 mg/dL (ref 0–40)

## 2017-08-24 MED ORDER — CIPROFLOXACIN HCL 500 MG PO TABS: 500.0000 mg | ORAL_TABLET | Freq: Two times a day (BID) | ORAL | 0 refills | Status: AC

## 2017-08-24 MED ORDER — PRAVASTATIN SODIUM 20 MG PO TABS: 20.0000 mg | ORAL_TABLET | Freq: Every day | ORAL | Status: DC

## 2017-08-24 MED ORDER — ASPIRIN EC 81 MG PO TBEC: 81.0000 mg | DELAYED_RELEASE_TABLET | Freq: Every day | ORAL | 0 refills | Status: AC

## 2017-08-24 MED ORDER — CLOPIDOGREL BISULFATE 75 MG PO TABS: 75.0000 mg | ORAL_TABLET | Freq: Every day | ORAL | 1 refills | Status: DC

## 2017-08-24 MED ORDER — ASPIRIN EC 81 MG PO TBEC
81.0000 mg | DELAYED_RELEASE_TABLET | Freq: Every day | ORAL | Status: DC
  Administered 2017-08-24: 81 mg via ORAL
  Filled 2017-08-24: qty 1

## 2017-08-24 MED ORDER — LOVASTATIN 10 MG PO TABS: 20.0000 mg | ORAL_TABLET | Freq: Every day | ORAL | 0 refills | Status: DC

## 2017-08-24 NOTE — Progress Notes (Signed)
Pt being discharged from hospital per orders from MD. Pt educated on discharge instructions. Pt verbalized understanding of instructions. All questions and concerns were addressed. Pt's IV was removed prior to discharge. Pt exited hospital via wheelchair accompanied by staff. 

## 2017-08-24 NOTE — Progress Notes (Signed)
STROKE TEAM PROGRESS NOTE   SUBJECTIVE (INTERVAL HISTORY) Daughter at bedside.  Patient stated that she is back to baseline.  However, she still has left nasolabial fold flattening, which was confirmed by daughter that was her baseline.    OBJECTIVE Temp:  [97.4 F (36.3 C)-98.5 F (36.9 C)] 97.4 F (36.3 C) (07/29 1130) Pulse Rate:  [58-77] 58 (07/29 1130) Cardiac Rhythm: Normal sinus rhythm (07/29 0700) Resp:  [16-20] 16 (07/29 1130) BP: (138-166)/(58-85) 164/67 (07/29 1130) SpO2:  [97 %-99 %] 99 % (07/29 1130)  CBC:  Recent Labs  Lab 08/22/17 1747 08/22/17 1802  WBC 7.8  --   NEUTROABS 5.0  --   HGB 12.4 12.9  HCT 37.7 38.0  MCV 82.9  --   PLT 238  --     Basic Metabolic Panel:  Recent Labs  Lab 08/22/17 1747 08/22/17 1802 08/24/17 0700  NA 139 140 141  K 3.3* 3.3* 4.1  CL 102 101 102  CO2 28  --  25  GLUCOSE 164* 163* 114*  BUN 11 10 7*  CREATININE 0.60 0.60 0.54  CALCIUM 9.9  --  9.5    Lipid Panel:     Component Value Date/Time   CHOL 180 08/24/2017 0700   TRIG 101 08/24/2017 0700   HDL 63 08/24/2017 0700   CHOLHDL 2.9 08/24/2017 0700   VLDL 20 08/24/2017 0700   LDLCALC 97 08/24/2017 0700   HgbA1c:  Lab Results  Component Value Date   HGBA1C 6.3 (H) 08/24/2017   Urine Drug Screen:     Component Value Date/Time   LABOPIA NONE DETECTED 08/22/2017 1716   COCAINSCRNUR NONE DETECTED 08/22/2017 1716   LABBENZ NONE DETECTED 08/22/2017 1716   AMPHETMU NONE DETECTED 08/22/2017 1716   THCU NONE DETECTED 08/22/2017 1716   LABBARB NONE DETECTED 08/22/2017 1716    Alcohol Level     Component Value Date/Time   ETH <10 08/22/2017 1747    IMAGING   Dg Chest 2 View 08/22/2017 IMPRESSION:  No active cardiopulmonary disease.    Ct Head Code Stroke Wo Contrast  08/22/2017 IMPRESSION:  1. No acute hemorrhage.  2. Age-indeterminate small vessel infarct of the right thalamus. MRI could be obtained for better temporal characterization, if  indicated.  3. ASPECTS is 10.    MRI / MRA Head Wo Contrast 08/23/2017 IMPRESSION: 1. No acute intracranial abnormality to account for the patient's symptoms. 2. Diffuse white matter changes are mildly advanced for age. This likely reflects the sequela of chronic microvascular ischemia. 3. MRA circle-of-Willis demonstrates a high-grade stenosis in the superior left P3 segment. 4. Other more distal small vessel disease without significant proximal stenosis, aneurysm, or branch vessel occlusion otherwise. 5. Loss of normal T1 marrow signal at the dens. While this is most likely degenerative, trauma or neoplasm is not excluded on the basis of these films. Recommend non emergent follow-up CT scan of the cervical spine for further evaluation.    Transthoracic Echocardiogram  08/23/2017 Study Conclusions - Left ventricle: The cavity size was normal. Wall thickness was   normal. Systolic function was normal. The estimated ejection   fraction was in the range of 60% to 65%. Wall motion was normal;   there were no regional wall motion abnormalities. Doppler   parameters are consistent with abnormal left ventricular   relaxation (grade 1 diastolic dysfunction). The E/e&' ratio is   between 8-15, suggesting indeterminate LV filling pressure. - Mitral valve: Mildly thickened leaflets . There was trivial  regurgitation. - Left atrium: The atrium was normal in size. - Atrial septum: No defect or patent foramen ovale was identified. - Inferior vena cava: The vessel was normal in size. The   respirophasic diameter changes were in the normal range (>= 50%),   consistent with normal central venous pressure. Impressions: - LVEF 60-65%, normal wall thickness, normal wall motion, grade 1   DD, indeterminate LV filling pressure, trivial MR, normal LA   size, negative for PFO by color doppler, normal IVC.    Bilateral Carotid Dopplers  00/00/00 Final Interpretation: Right Carotid: Velocities in the  right ICA are consistent with a 1-39% stenosis. Left Carotid: Velocities in the left ICA are consistent with a 1-39% stenosis Vertebrals: Bilateral vertebral arteries demonstrate antegrade flow. Subclavians: Normal flow hemodynamics were seen in bilateral subclavian       arteries.    PHYSICAL EXAM Vitals:   08/24/17 0000 08/24/17 0323 08/24/17 0857 08/24/17 1130  BP: (!) 138/58 (!) 150/77 (!) 156/74 (!) 164/67  Pulse: 64 77 67 (!) 58  Resp: 18 18 20 16   Temp: 98.5 F (36.9 C) 97.6 F (36.4 C) 97.8 F (36.6 C) (!) 97.4 F (36.3 C)  TempSrc: Oral Oral Oral Oral  SpO2: 99% 97% 97% 99%  Weight:      Height:        Awake, alert, fully oriented.   Language - fluent.  Comprehension, naming, repetition- intact. Face symmetric.  Tongue midline. Right eye only can see HW with corneal clouding, chronic Motor 5/5 BUE and BLE. Sensory - intact. Coord- intact.  No babinski.   ASSESSMENT/PLAN [name] is a 82 y.o. female with history of small bowel obstruction, hypothyroidism, hypertension, hyperlipidemia, and diabetes mellitus presenting with transient facial droop, dysarthria, confusion. She did not receive IV t-PA due to resolution of deficits.  Possible TIA:    Resultant resolution of deficits  CT head -no acute hemorrhage  MRI head - no acute intracranial abnormality  MRA head - high-grade stenosis in the superior left P3 segment.  Carotid Doppler - normal  2D Echo - EF 60 to 65%. No cardiac source of emboli identified.  LDL - 97  HgbA1c - 6.3  VTE prophylaxis -  - Lovenox Diet Order           Diet Carb Modified        Diet Carb Modified Fluid consistency: Thin; Room service appropriate? Yes  Diet effective now          aspirin 81 mg daily prior to admission, now on aspirin 325 mg daily  Patient counseled to be compliant with her antithrombotic medications  Ongoing aggressive stroke risk factor management  Therapy recommendations:  No  f/u therapies.  Disposition:  Pending  Hypertension  Stable . Permissive hypertension (OK if < 220/120) but gradually normalize in 5-7 days . Long-term BP goal normotensive  Hyperlipidemia  Lipid lowering medication PTA: Mevacor 10 mg daily  LDL pending, goal < 70  Current lipid lowering medication: Pravachol 10 mg daily  Continue statin at discharge  Diabetes  HgbA1c pending, goal < 7.0  Unc / Controlled  Other Stroke Risk Factors  Advanced age  ETOH use, advised to drink no more than 1 alcoholic beverage per day.   Other Active Problems  Mild hypokalemia - 3.3  Loss of normal T1 marrow signal at the dens. Recommend non emergent follow-up CT scan of the cervical spine for further evaluation.      Hospital day #  0  ATTENDING NOTE: I reviewed above note and agree with the assessment and plan. I have made any additions or clarifications directly to the above note. Pt was seen and examined.   82 year old female with history of diabetes, right eye legally blind due to glaucoma, HTN, HLD admitted for confusion and left-sided facial droop slurred speech for 20 minutes.  Symptoms now resolved, but she still has chronic left nasolabial fold flattening.  MRI negative for stroke MRA showed left P3 stenosis.  Carotid Doppler negative EF 60 to 65%.  LDL 97 and A1c 6.3 UDS negative.  Patient symptoms still more consistent with TIA given risk factors.  Recommend aspirin 81 and Plavix for 3 weeks and then Plavix alone.  Increase lovastatin to 20 mg for cholesterol control.  Stroke risk factor modification.  Neurology will sign off. Please call with questions. Pt will follow up with stroke clinic NP at [facility] in about 4 weeks. Thanks for the consult.   Marvel Plan, MD PhD Stroke Neurology 08/25/2017 12:42 AM    To contact Stroke Continuity provider, please refer to [email or web address]. After hours, contact General Neurology

## 2017-08-24 NOTE — Care Management Note (Signed)
Case Management Note  Patient Details  Name: Asencion GowdaRita L Cumber MRN: [id number] Date of Birth: [birth year]  Subjective/Objective:   Pt in with TIA. She is from home with her daughter.                  Action/Plan: Pt discharging home with daughter. She has PCP, insurance and transportation home.   Expected Discharge Date:  08/24/17               Expected Discharge Plan:  Home/Self Care  In-House Referral:     Discharge planning Services     Post Acute Care Choice:    Choice offered to:     DME Arranged:    DME Agency:     HH Arranged:    HH Agency:     Status of Service:  Completed, signed off  If discussed at [employer] Length of Stay Meetings, dates discussed:    Additional Comments:  Kermit BaloKelli F Elmo Shumard, RN 08/24/2017, 2:06 PM

## 2017-08-24 NOTE — Progress Notes (Signed)
SLP Cancellation Note  Patient Details Name: Priscilla Tran MRN: [id number] DOB: 14-Dec-1935   Cancelled treatment:       Reason Eval/Treat Not Completed: SLP screened, no needs identified, will sign off   Cindee Mclester 08/24/2017, 11:15 AM

## 2017-08-24 NOTE — Discharge Instructions (Signed)
Transient Ischemic Attack °A transient ischemic attack (TIA) is a "warning stroke" that causes stroke-like symptoms. A TIA does not cause lasting damage to the brain. The symptoms of a TIA can happen fast and do not last long. It is important to know the symptoms of a TIA and what to do. This can help prevent stroke or death. °Follow these instructions at home: °· Take medicines only as told by your doctor. Make sure you understand all of the instructions. °· You may need to take aspirin or warfarin medicine. Warfarin needs to be taken exactly as told. °? Taking too much or too little warfarin is dangerous. Blood tests must be done as often as told by your doctor. A PT blood test measures how long it takes for blood to clot. Your PT is used to calculate another value called an INR. Your PT and INR help your doctor adjust your warfarin dosage. He or she will make sure you are taking the right amount. °? Food can cause problems with warfarin and affect the results of your blood tests. This is true for foods high in vitamin K. Eat the same amount of foods high in vitamin K each day. Foods high in vitamin K include spinach, kale, broccoli, cabbage, collard and turnip greens, Brussels sprouts, peas, cauliflower, seaweed, and parsley. Other foods high in vitamin K include beef and pork liver, green tea, and soybean oil. Eat the same amount of foods high in vitamin K each day. Avoid big changes in your diet. Tell your doctor before changing your diet. Talk to a food specialist (dietitian) if you have questions. °? Many medicines can cause problems with warfarin and affect your PT and INR. Tell your doctor about all medicines you take. This includes vitamins and dietary pills (supplements). Do not take or stop taking any prescribed or over-the-counter medicines unless your doctor tells you to. °? Warfarin can cause more bruising or bleeding. Hold pressure over any cuts for longer than normal. Talk to your doctor about other  side effects of warfarin. °? Avoid sports or activities that may cause injury or bleeding. °? Be careful when you shave, floss, or use sharp objects. °? Avoid or drink very little alcohol while taking warfarin. Tell your doctor if you change how much alcohol you drink. °? Tell your dentist and other doctors that you take warfarin before any procedures. °· Follow your diet program as told, if you are given one. °· Keep a healthy weight. °· Stay active. Try to get at least 30 minutes of activity on all or most days. °· Do not use any tobacco products, including cigarettes, chewing tobacco, or electronic cigarettes. If you need help quitting, ask your doctor. °· Limit alcohol intake to no more than 1 drink per day for nonpregnant women and 2 drinks per day for men. One drink equals 12 ounces of beer, 5 ounces of wine, or 1½ ounces of hard liquor. °· Do not abuse drugs. °· Keep your home safe so you do not fall. You can do this by: °? Putting grab bars in the bedroom and bathroom. °? Raising toilet seats. °? Putting a seat in the shower. °· Keep all follow-up visits as told by your doctor. This is important. °Contact a doctor if: °· Your personality changes. °· You have trouble swallowing. °· You have double vision. °· You are dizzy. °· You have a fever. °Get help right away if: °These symptoms may be an emergency. Do not wait to see if   the symptoms will go away. Get medical help right away. Call your local emergency services (911 in the [place] Do not drive yourself to the hospital. °· You have sudden weakness or lose feeling (go numb), especially on one side of the body. This can affect your: °? Face. °? Arm. °? Leg. °· You have sudden trouble walking. °· You have sudden trouble moving your arms or legs. °· You have sudden confusion. °· You have trouble talking. °· You have trouble understanding. °· You have sudden trouble seeing in one or both eyes. °· You lose your balance. °· Your movements are not smooth. °· You  have a sudden, very bad headache with no known cause. °· You have new chest pain. °· Your heartbeat is unsteady. °· You are partly or totally unaware of what is going on around you. ° °This information is not intended to replace advice given to you by your health care provider. Make sure you discuss any questions you have with your health care provider. °Document Released: 10/23/2007 Document Revised: 09/17/2015 Document Reviewed: 04/20/2013 °[employer] Interactive Patient Education © 2018 [employer] ° °

## 2017-08-24 NOTE — Discharge Summary (Signed)
[facility]  Physician Discharge Summary   Patient ID: Priscilla Tran MRN: [id number] DOB/AGE: 1935/02/03 82 y.o.  Admit date: 08/22/2017 Discharge date: 08/24/2017  PCP: Tracey Harries, MD  DISCHARGE DIAGNOSES:  TIA Positive urine cultures, possible UTI  RECOMMENDATIONS FOR OUTPATIENT FOLLOW UP: 1. CT cervical spine shows significant arthritis with spinal stenosis.  Consider referral to neurosurgery. 2. Ambulatory referral to neurology has been ordered 3. Urine culture sensitivities are pending.   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  Symptoms have resolved by the time she was evaluated in the emergency department.  She was hospitalized for further management.   Consultants: Neurology  Procedures:   Carotid Dopplers No significant stenosis noted.  Transthoracic echocardiogram Study Conclusions  - Left ventricle: The cavity size was normal. Wall thickness was normal. Systolic function was normal. The estimated ejection fraction was in the range of 60% to 65%. Wall motion was normal; there were no regional wall motion abnormalities. Doppler parameters are consistent with abnormal left ventricular relaxation (grade 1 diastolic dysfunction). The E/e&' ratio is between 8-15, suggesting indeterminate LV filling pressure. - Mitral valve: Mildly thickened leaflets . There was trivial regurgitation. - Left atrium: The atrium was normal in size. - Atrial septum: No defect or patent foramen ovale was identified. - Inferior vena cava: The vessel was normal in size.  The respirophasic diameter changes were in the normal range (>= 50%), consistent with normal central venous pressure.  Impressions:  - LVEF 60-65%, normal wall thickness, normal wall motion, grade 1 DD, indeterminate LV filling pressure, trivial MR, normal LA size, negative for PFO by color doppler, normal IVC.   HOSPITAL COURSE:   Transient ischemic attack Patient symptoms resolved in 20 minutes or so.  Consistent with TIA.  MRI does not show any stroke.  MRI does suggest stenosis in the superior left P3 segment of the circle of Willis.  Patient did take aspirin and Plavix for 3 weeks followed by Plavix alone.  LDL 97.  Dose of statin has been increased.  HbA1c 6.3.  Carotid Dopplers without any significant stenosis.  Echocardiogram report as above.  Discussed with neurology today.  No further work-up.  Abnormal appearing Dens This was noted on the MRI.  CT scan was recommended which was done with findings as mentioned below.  Significant arthritis with spinal stenosis with cord flattening.  Patient without any neurological deficits.  She does get occasional numbness in her fingers.  This is not an acute issue.  She may benefit from outpatient referral to neurosurgery.  Essential hypertension Allowing permissive hypertension for now.  Monitor blood pressures closely.  Diabetes mellitus type 2 Continue home medications.  HbA1c 6.3.  Dyslipidemia Dose of statin increased for LDL of 97.  Hypothyroidism Continue home medications.  History of glaucoma Continue with eyedrops.  Positive urine culture Urine culture is growing Klebsiella.  UA was not that abnormal to begin with.  Patient has noticed some increase in frequency with foul-smelling urine but denies any dysuria.  Symptoms are nonspecific.  The patient denies any allergies to antibiotics.  Discussed with her and her daughter.  Will do a 5-day course  of ciprofloxacin.  Sensitivities on the urine culture not  available yet.  Overall stable.  Okay for discharge home with family.    PERTINENT LABS:  The results of significant diagnostics from this hospitalization (including imaging, microbiology, ancillary and laboratory) are listed below for reference.    Microbiology: Recent Results (from the past 240 hour(s))  Urine culture     Status: Abnormal (Preliminary result)   Collection Time: 08/22/17  7:42 PM  Result Value Ref Range Status   Specimen Description   Final    URINE, CLEAN CATCH Performed at [facility], [address] [place], [place] [address]    Special Requests   Final    NONE Performed at [facility], [address], [place], [place] [address]    Culture >=100,000 COLONIES/mL KLEBSIELLA PNEUMONIAE (A)  Final   Report Status PENDING  Incomplete     Labs: Basic Metabolic Panel: Recent Labs  Lab 08/22/17 1747 08/22/17 1802 08/24/17 0700  NA 139 140 141  K 3.3* 3.3* 4.1  CL 102 101 102  CO2 28  --  25  GLUCOSE 164* 163* 114*  BUN 11 10 7*  CREATININE 0.60 0.60 0.54  CALCIUM 9.9  --  9.5   Liver Function Tests: Recent Labs  Lab 08/22/17 1747  AST 21  ALT 13  ALKPHOS 62  BILITOT 1.1  PROT 7.6  ALBUMIN 4.2   CBC: Recent Labs  Lab 08/22/17 1747 08/22/17 1802  WBC 7.8  --   NEUTROABS 5.0  --   HGB 12.4 12.9  HCT 37.7 38.0  MCV 82.9  --   PLT 238  --     CBG: Recent Labs  Lab 08/23/17 1122 08/23/17 1647 08/23/17 2122 08/24/17 0731 08/24/17 1126  GLUCAP 91 91 102* 125* 105*     IMAGING STUDIES Dg Chest 2 View  Result Date: 08/22/2017 CLINICAL DATA:  TIA, slurred speech, confusion.  Hypertension EXAM: CHEST - 2 VIEW COMPARISON:  None. FINDINGS: Heart and mediastinal contours are within normal limits. No focal opacities or effusions. No acute bony abnormality. IMPRESSION: No active cardiopulmonary disease. Electronically Signed   By: Charlett Nose M.D.   On: 08/22/2017 20:47   Ct Cervical Spine Wo  Contrast  Result Date: 08/23/2017 CLINICAL DATA:  Abnormal dens on MRI. EXAM: CT CERVICAL SPINE WITHOUT CONTRAST TECHNIQUE: Multidetector CT imaging of the cervical spine was performed without intravenous contrast. Multiplanar CT image reconstructions were also generated. COMPARISON:  Brain MRI from earlier today FINDINGS: Alignment: Mild levocurvature. Skull base and vertebrae: The dens is sclerotic with erosions towards the left aspect where there is remarkably severe C1-2 facet degeneration with subchondral erosions, sclerosis, and bulky spurring. No fracture or destructive lesion is seen. There is scattered soft tissue/discal calcifications. CPPD arthropathy is possible with this pattern. Soft tissues and spinal canal: Calcified thyroid nodules considered incidental. Carotid calcification Disc levels: C1-2: Atlantodental and left facet degeneration that is severe, as above. Left facet spurring impinges on the left C2 nerve root. C2-3: Asymmetric bulky left facet spurring. Left uncovertebral spurring. Left foraminal stenosis C3-4: Advanced disc narrowing with bulging of the calcified annulus. Left facet spurring. Uncovertebral spurring. Biforaminal impingement. Spinal stenosis with probable ventral cord flattening. C4-5: Disc narrowing with annular calcification. Bilateral facet spurring. Disc osteophyte complex flattens the cord. Advanced biforaminal impingement. C5-6: Advanced disc narrowing with endplate ridging and a calcified disc protrusion. Severe spinal stenosis with implied cord flattening. Maximal AP canal diameter in the midline is  4 mm. Advanced biforaminal impingement C6-7: Disc narrowing with a calcified central disc protrusion presumably flattening the ventral cord. Mild or moderate bilateral foraminal narrowing. C7-T1:Facet spurring and disc narrowing.  No visible impingement Upper chest: Negative . IMPRESSION: 1. MRI findings at the dens reflects severe atlantodental and left C1/2 facet  degeneration with sclerosis and chronic erosions. Left facet spurring impinges on the left C2 nerve. Calcium pyrophosphate deposition disease is a consideration given the dens appearance and diffuse soft tissue calcifications. 2. Advanced cervical spine degeneration with cord impingement likely from C3-4 to C6-7. Spinal stenosis is severe at C5-6 with probable advanced cord flattening. 3. Diffuse biforaminal impingement as described. Electronically Signed   By: Marnee Spring M.D.   On: 08/23/2017 19:51   Mr Brain Wo Contrast  Result Date: 08/23/2017 CLINICAL DATA:  TIA.  Facial droop. EXAM: MRI HEAD WITHOUT CONTRAST MRA HEAD WITHOUT CONTRAST TECHNIQUE: Multiplanar, multiecho pulse sequences of the brain and surrounding structures were obtained without intravenous contrast. Angiographic images of the head were obtained using MRA technique without contrast. COMPARISON:  CT head without contrast 08/22/2017. FINDINGS: MRI HEAD FINDINGS Brain: The diffusion-weighted images demonstrate no acute or subacute infarct to explain the patient's symptoms. Periventricular and subcortical T2 changes bilaterally are mildly advanced for age. There is mild generalized atrophy. White matter changes extend into the brainstem. Cerebellum is unremarkable. The lesion in the right thalamus is remote. No acute hemorrhage or mass lesion is present. The ventricles are proportionate to the degree of atrophy. No significant extra-axial fluid collection is present. Vascular: Flow is present in the major intracranial arteries. Skull and upper cervical spine: The skull base is within normal limits. There is loss of normal marrow signal at the dens. This may be degenerative. There is no displacement. Degenerative changes are present at C3-4 and C4-5. Marrow signal is otherwise normal. Sinuses/Orbits: The paranasal sinuses clear. Scleral banding is present at the right globe. Bilateral lens replacements are noted. Globes and orbits are  otherwise within normal limits. MRA HEAD FINDINGS Mild atherosclerotic changes are present in the cavernous internal carotid arteries bilaterally. There is no significant stenosis relative to the ICA termini. Prominent posterior communicating arteries are present bilaterally. The A1 and M1 segments are normal. No definite anterior communicating artery is visualized. MCA bifurcations are within normal limits bilaterally. ACA and MCA branch vessels are normal. The right vertebral artery is the dominant vessel. Left PICA origin is visualized and normal. The right AICA is dominant. The basilar artery is small, terminating at the superior cerebellar arteries bilaterally. Posterior cerebral arteries are of fetal type bilaterally. There is a high-grade stenosis involving the superior left P3 division. IMPRESSION: 1. No acute intracranial abnormality to account for the patient's symptoms. 2. Diffuse white matter changes are mildly advanced for age. This likely reflects the sequela of chronic microvascular ischemia. 3. MRA circle-of-Willis demonstrates a high-grade stenosis in the superior left P3 segment. 4. Other more distal small vessel disease without significant proximal stenosis, aneurysm, or branch vessel occlusion otherwise. 5. Loss of normal T1 marrow signal at the dens. While this is most likely degenerative, trauma or neoplasm is not excluded on the basis of these films. Recommend non emergent follow-up CT scan of the cervical spine for further evaluation. Electronically Signed   By: Marin Roberts M.D.   On: 08/23/2017 09:56   Mr Maxine Glenn Head Wo Contrast  Result Date: 08/23/2017 CLINICAL DATA:  TIA.  Facial droop. EXAM: MRI HEAD WITHOUT CONTRAST MRA HEAD WITHOUT CONTRAST TECHNIQUE:  Multiplanar, multiecho pulse sequences of the brain and surrounding structures were obtained without intravenous contrast. Angiographic images of the head were obtained using MRA technique without contrast. COMPARISON:  CT head  without contrast 08/22/2017. FINDINGS: MRI HEAD FINDINGS Brain: The diffusion-weighted images demonstrate no acute or subacute infarct to explain the patient's symptoms. Periventricular and subcortical T2 changes bilaterally are mildly advanced for age. There is mild generalized atrophy. White matter changes extend into the brainstem. Cerebellum is unremarkable. The lesion in the right thalamus is remote. No acute hemorrhage or mass lesion is present. The ventricles are proportionate to the degree of atrophy. No significant extra-axial fluid collection is present. Vascular: Flow is present in the major intracranial arteries. Skull and upper cervical spine: The skull base is within normal limits. There is loss of normal marrow signal at the dens. This may be degenerative. There is no displacement. Degenerative changes are present at C3-4 and C4-5. Marrow signal is otherwise normal. Sinuses/Orbits: The paranasal sinuses clear. Scleral banding is present at the right globe. Bilateral lens replacements are noted. Globes and orbits are otherwise within normal limits. MRA HEAD FINDINGS Mild atherosclerotic changes are present in the cavernous internal carotid arteries bilaterally. There is no significant stenosis relative to the ICA termini. Prominent posterior communicating arteries are present bilaterally. The A1 and M1 segments are normal. No definite anterior communicating artery is visualized. MCA bifurcations are within normal limits bilaterally. ACA and MCA branch vessels are normal. The right vertebral artery is the dominant vessel. Left PICA origin is visualized and normal. The right AICA is dominant. The basilar artery is small, terminating at the superior cerebellar arteries bilaterally. Posterior cerebral arteries are of fetal type bilaterally. There is a high-grade stenosis involving the superior left P3 division. IMPRESSION: 1. No acute intracranial abnormality to account for the patient's symptoms. 2.  Diffuse white matter changes are mildly advanced for age. This likely reflects the sequela of chronic microvascular ischemia. 3. MRA circle-of-Willis demonstrates a high-grade stenosis in the superior left P3 segment. 4. Other more distal small vessel disease without significant proximal stenosis, aneurysm, or branch vessel occlusion otherwise. 5. Loss of normal T1 marrow signal at the dens. While this is most likely degenerative, trauma or neoplasm is not excluded on the basis of these films. Recommend non emergent follow-up CT scan of the cervical spine for further evaluation. Electronically Signed   By: Marin Roberts M.D.   On: 08/23/2017 09:56   Ct Head Code Stroke Wo Contrast  Result Date: 08/22/2017 CLINICAL DATA:  Code stroke. Left-sided facial droop with slurred speech. EXAM: CT HEAD WITHOUT CONTRAST TECHNIQUE: Contiguous axial images were obtained from the base of the skull through the vertex without intravenous contrast. COMPARISON:  None. FINDINGS: Brain: There is no mass, hemorrhage or extra-axial collection. The size and configuration of the ventricles and extra-axial CSF spaces are normal. Age indeterminate small vessel infarct of the right thalamus. There is hypoattenuation of the periventricular white matter, most commonly indicating chronic ischemic microangiopathy. Vascular: No abnormal hyperdensity of the major intracranial arteries or dural venous sinuses. No intracranial atherosclerosis. Skull: The visualized skull base, calvarium and extracranial soft tissues are normal. Sinuses/Orbits: No fluid levels or advanced mucosal thickening of the visualized paranasal sinuses. No mastoid or middle ear effusion. The orbits are normal. ASPECTS [facility] Stroke Program Early CT Score) - Ganglionic level infarction (caudate, lentiform nuclei, internal capsule, insula, M1-M3 cortex): 7 - Supraganglionic infarction (M4-M6 cortex): 3 Total score (0-10 with 10 being normal): 10 IMPRESSION: 1.  No  acute hemorrhage. 2. Age-indeterminate small vessel infarct of the right thalamus. MRI could be obtained for better temporal characterization, if indicated. 3. ASPECTS is 10. 4. These results were called by telephone at the time of interpretation on 08/22/2017 at 5:46 pm to Dr. Marily MemosJASON MESNER , who verbally acknowledged these results. Electronically Signed   By: Deatra RobinsonKevin  Herman M.D.   On: 08/22/2017 17:46    DISCHARGE EXAMINATION: Vitals:   08/24/17 0000 08/24/17 0323 08/24/17 0857 08/24/17 1130  BP: (!) 138/58 (!) 150/77 (!) 156/74 (!) 164/67  Pulse: 64 77 67 (!) 58  Resp: 18 18 20 16   Temp: 98.5 F (36.9 C) 97.6 F (36.4 C) 97.8 F (36.6 C) (!) 97.4 F (36.3 C)  TempSrc: Oral Oral Oral Oral  SpO2: 99% 97% 97% 99%  Weight:      Height:       General appearance: alert, cooperative, appears stated age and no distress Resp: clear to auscultation bilaterally Cardio: regular rate and rhythm, S1, S2 normal, no murmur, click, rub or gallop GI: soft, non-tender; bowel sounds normal; no masses,  no organomegaly  DISPOSITION: Home  Discharge Instructions    Call MD for:  difficulty breathing, headache or visual disturbances   Complete by:  As directed    Call MD for:  extreme fatigue   Complete by:  As directed    Call MD for:  persistant dizziness or light-headedness   Complete by:  As directed    Call MD for:  persistant nausea and vomiting   Complete by:  As directed    Call MD for:  severe uncontrolled pain   Complete by:  As directed    Call MD for:  temperature >100.4   Complete by:  As directed    Diet Carb Modified   Complete by:  As directed    Discharge instructions   Complete by:  As directed    Please be sure to follow-up with your primary care provider to discuss the arthritis noted in your neck.  You may benefit from being referred to neurosurgery.  You were cared for by a hospitalist during your hospital stay. If you have any questions about your discharge medications or  the care you received while you were in the hospital after you are discharged, you can call the unit and asked to speak with the hospitalist on call if the hospitalist that took care of you is not available. Once you are discharged, your primary care physician will handle any further medical issues. Please note that NO REFILLS for any discharge medications will be authorized once you are discharged, as it is imperative that you return to your primary care physician (or establish a relationship with a primary care physician if you do not have one) for your aftercare needs so that they can reassess your need for medications and monitor your lab values. If you do not have a primary care physician, you can call [phone] for a physician referral.   Increase activity slowly   Complete by:  As directed         Allergies as of 08/24/2017      Reactions   Lisinopril Swelling   Face and throat. Breathing not affected per patient.      Medication List    TAKE these medications   amLODipine 10 MG tablet Commonly known as:  NORVASC Take 10 mg by mouth daily.   aspirin EC 81 MG tablet Take 1 tablet (81 mg total) by  mouth daily for 21 days. FOR 3 WEEKS ONLY What changed:  additional instructions   calcium carbonate 600 MG Tabs tablet Commonly known as:  OS-CAL Take 600 mg by mouth daily.   carvedilol 12.5 MG tablet Commonly known as:  COREG Take 12.5 mg by mouth once.   ciprofloxacin 500 MG tablet Commonly known as:  CIPRO Take 1 tablet (500 mg total) by mouth 2 (two) times daily for 5 days.   clopidogrel 75 MG tablet Commonly known as:  PLAVIX Take 1 tablet (75 mg total) by mouth daily. Start taking on:  08/25/2017   dorzolamide-timolol 22.3-6.8 MG/ML ophthalmic solution Commonly known as:  COSOPT Place 1 drop into the left eye 2 (two) times daily.   hydrochlorothiazide 25 MG tablet Commonly known as:  HYDRODIURIL Take 25 mg by mouth every other day.   levothyroxine 150 MCG  tablet Commonly known as:  SYNTHROID, LEVOTHROID Take 150 mcg by mouth daily.   lovastatin 10 MG tablet Commonly known as:  MEVACOR Take 2 tablets (20 mg total) by mouth at bedtime. What changed:  how much to take   metFORMIN 1000 MG tablet Commonly known as:  GLUCOPHAGE Take 1,000 mg by mouth 2 (two) times daily with a meal.   prednisoLONE acetate 1 % ophthalmic suspension Commonly known as:  PRED FORTE Place 1 drop into the left eye 2 (two) times daily.   VITAMIN B-12 PO Take 1 tablet by mouth daily.        Follow-up Information    Tracey Harries, MD Follow up.   Specialty:  Family Medicine Why:  to discuss abnormal CT cervical spine and consider referral to neurosurgery Contact information: [address] [facility] [address] [phone]           TOTAL DISCHARGE TIME: 35 minutes  Osvaldo Shipper  [facility] Pager [phone]  08/24/2017, 1:33 PM

## 2017-08-25 LAB — URINE CULTURE: Culture: >=100000 — AB

## 2017-09-15 ENCOUNTER — Encounter (HOSPITAL_COMMUNITY): Payer: Self-pay | Admitting: Internal Medicine

## 2017-09-15 ENCOUNTER — Observation Stay (HOSPITAL_COMMUNITY): Payer: Medicare HMO

## 2017-09-15 ENCOUNTER — Emergency Department (HOSPITAL_COMMUNITY): Payer: Medicare HMO

## 2017-09-15 ENCOUNTER — Inpatient Hospital Stay (HOSPITAL_COMMUNITY)
Admission: EM | Admit: 2017-09-15 | Discharge: 2017-09-23 | DRG: 472 | Disposition: A | Discharge status: Home Health | Payer: Medicare HMO | Attending: Family Medicine | Admitting: Family Medicine

## 2017-09-15 DIAGNOSIS — G8194 Hemiplegia, unspecified affecting left nondominant side: Secondary | ICD-10-CM | POA: Diagnosis present

## 2017-09-15 DIAGNOSIS — G9520 Unspecified cord compression: Secondary | ICD-10-CM | POA: Diagnosis present

## 2017-09-15 DIAGNOSIS — G459 Transient cerebral ischemic attack, unspecified: Secondary | ICD-10-CM | POA: Diagnosis present

## 2017-09-15 DIAGNOSIS — Z419 Encounter for procedure for purposes other than remedying health state, unspecified: Secondary | ICD-10-CM

## 2017-09-15 DIAGNOSIS — Z79890 Hormone replacement therapy (postmenopausal): Secondary | ICD-10-CM

## 2017-09-15 DIAGNOSIS — E119 Type 2 diabetes mellitus without complications: Secondary | ICD-10-CM | POA: Diagnosis not present

## 2017-09-15 DIAGNOSIS — E039 Hypothyroidism, unspecified: Secondary | ICD-10-CM | POA: Diagnosis present

## 2017-09-15 DIAGNOSIS — H4040X3 Glaucoma secondary to eye inflammation, unspecified eye, severe stage: Secondary | ICD-10-CM | POA: Diagnosis present

## 2017-09-15 DIAGNOSIS — Z79899 Other long term (current) drug therapy: Secondary | ICD-10-CM

## 2017-09-15 DIAGNOSIS — R200 Anesthesia of skin: Secondary | ICD-10-CM | POA: Diagnosis not present

## 2017-09-15 DIAGNOSIS — Z888 Allergy status to other drugs, medicaments and biological substances status: Secondary | ICD-10-CM

## 2017-09-15 DIAGNOSIS — E785 Hyperlipidemia, unspecified: Secondary | ICD-10-CM | POA: Diagnosis present

## 2017-09-15 DIAGNOSIS — M4802 Spinal stenosis, cervical region: Secondary | ICD-10-CM | POA: Diagnosis not present

## 2017-09-15 DIAGNOSIS — Z7984 Long term (current) use of oral hypoglycemic drugs: Secondary | ICD-10-CM

## 2017-09-15 DIAGNOSIS — G992 Myelopathy in diseases classified elsewhere: Secondary | ICD-10-CM | POA: Diagnosis present

## 2017-09-15 DIAGNOSIS — I10 Essential (primary) hypertension: Secondary | ICD-10-CM | POA: Diagnosis present

## 2017-09-15 DIAGNOSIS — H4041X3 Glaucoma secondary to eye inflammation, right eye, severe stage: Secondary | ICD-10-CM | POA: Diagnosis not present

## 2017-09-15 DIAGNOSIS — M5136 Other intervertebral disc degeneration, lumbar region: Secondary | ICD-10-CM | POA: Diagnosis present

## 2017-09-15 DIAGNOSIS — Z7982 Long term (current) use of aspirin: Secondary | ICD-10-CM

## 2017-09-15 DIAGNOSIS — H409 Unspecified glaucoma: Secondary | ICD-10-CM | POA: Diagnosis present

## 2017-09-15 DIAGNOSIS — E1136 Type 2 diabetes mellitus with diabetic cataract: Secondary | ICD-10-CM | POA: Diagnosis present

## 2017-09-15 DIAGNOSIS — Z8249 Family history of ischemic heart disease and other diseases of the circulatory system: Secondary | ICD-10-CM

## 2017-09-15 DIAGNOSIS — H5461 Unqualified visual loss, right eye, normal vision left eye: Secondary | ICD-10-CM | POA: Diagnosis present

## 2017-09-15 DIAGNOSIS — R531 Weakness: Secondary | ICD-10-CM

## 2017-09-15 DIAGNOSIS — Z7902 Long term (current) use of antithrombotics/antiplatelets: Secondary | ICD-10-CM

## 2017-09-15 DIAGNOSIS — M2578 Osteophyte, vertebrae: Secondary | ICD-10-CM | POA: Diagnosis present

## 2017-09-15 LAB — I-STAT CHEM 8, ED
BUN: 12 mg/dL (ref 8–23)
CHLORIDE: 98 mmol/L (ref 98–111)
Calcium, Ion: 1.11 mmol/L — ABNORMAL LOW (ref 1.15–1.40)
Creatinine, Ser: 0.5 mg/dL (ref 0.44–1.00)
Glucose, Bld: 157 mg/dL — ABNORMAL HIGH (ref 70–99)
HEMATOCRIT: 40 % (ref 36.0–46.0)
HEMOGLOBIN: 13.6 g/dL (ref 12.0–15.0)
POTASSIUM: 3.6 mmol/L (ref 3.5–5.1)
Sodium: 137 mmol/L (ref 135–145)
TCO2: 25 mmol/L (ref 22–32)

## 2017-09-15 LAB — COMPREHENSIVE METABOLIC PANEL
ALBUMIN: 4.5 g/dL (ref 3.5–5.0)
ALK PHOS: 66 U/L (ref 38–126)
ALT: 12 U/L (ref 0–44)
AST: 21 U/L (ref 15–41)
Anion gap: 11 (ref 5–15)
BILIRUBIN TOTAL: 0.8 mg/dL (ref 0.3–1.2)
BUN: 11 mg/dL (ref 8–23)
CALCIUM: 10 mg/dL (ref 8.9–10.3)
CO2: 26 mmol/L (ref 22–32)
Chloride: 101 mmol/L (ref 98–111)
Creatinine, Ser: 0.62 mg/dL (ref 0.44–1.00)
GFR calc Af Amer: >60 mL/min (ref >60)
GLUCOSE: 161 mg/dL — AB (ref 70–99)
Potassium: 3.7 mmol/L (ref 3.5–5.1)
Sodium: 138 mmol/L (ref 135–145)
TOTAL PROTEIN: 7.7 g/dL (ref 6.5–8.1)

## 2017-09-15 LAB — URINALYSIS, ROUTINE W REFLEX MICROSCOPIC
Bilirubin Urine: NEGATIVE
Glucose, UA: NEGATIVE mg/dL
Hgb urine dipstick: NEGATIVE
Ketones, ur: NEGATIVE mg/dL
LEUKOCYTES UA: NEGATIVE
NITRITE: NEGATIVE
PROTEIN: NEGATIVE mg/dL
SPECIFIC GRAVITY, URINE: 1.003 — AB (ref 1.005–1.030)
pH: 7 (ref 5.0–8.0)

## 2017-09-15 LAB — CBC
HCT: 38.3 % (ref 36.0–46.0)
Hemoglobin: 12.4 g/dL (ref 12.0–15.0)
MCH: 26.6 pg (ref 26.0–34.0)
MCHC: 32.4 g/dL (ref 30.0–36.0)
MCV: 82.2 fL (ref 78.0–100.0)
Platelets: 228 10*3/uL (ref 150–400)
RBC: 4.66 MIL/uL (ref 3.87–5.11)
RDW: 14.6 % (ref 11.5–15.5)
WBC: 7.2 10*3/uL (ref 4.0–10.5)

## 2017-09-15 LAB — PROTIME-INR
INR: 0.96
PROTHROMBIN TIME: 12.7 s (ref 11.4–15.2)

## 2017-09-15 LAB — DIFFERENTIAL
Abs Immature Granulocytes: 0 10*3/uL (ref 0.0–0.1)
Basophils Absolute: 0.1 10*3/uL (ref 0.0–0.1)
Basophils Relative: 1 %
EOS PCT: 3 %
Eosinophils Absolute: 0.2 10*3/uL (ref 0.0–0.7)
IMMATURE GRANULOCYTES: 0 %
LYMPHS ABS: 1.4 10*3/uL (ref 0.7–4.0)
Lymphocytes Relative: 20 %
MONO ABS: 0.7 10*3/uL (ref 0.1–1.0)
MONOS PCT: 9 %
Neutro Abs: 4.9 10*3/uL (ref 1.7–7.7)
Neutrophils Relative %: 67 %

## 2017-09-15 LAB — APTT: aPTT: 28 seconds (ref 24–36)

## 2017-09-15 LAB — I-STAT TROPONIN, ED: TROPONIN I, POC: 0 ng/mL (ref 0.00–0.08)

## 2017-09-15 MED ORDER — AMLODIPINE BESYLATE 10 MG PO TABS
10.0000 mg | ORAL_TABLET | Freq: Every day | ORAL | Status: DC
  Administered 2017-09-16 – 2017-09-23 (×8): 10 mg via ORAL
  Filled 2017-09-15 (×8): qty 1

## 2017-09-15 MED ORDER — DORZOLAMIDE HCL-TIMOLOL MAL 2-0.5 % OP SOLN
1.0000 [drp] | Freq: Two times a day (BID) | OPHTHALMIC | Status: DC
  Administered 2017-09-18 – 2017-09-23 (×11): 1 [drp] via OPHTHALMIC
  Filled 2017-09-15 (×2): qty 10

## 2017-09-15 MED ORDER — ACETAMINOPHEN 325 MG PO TABS
650.0000 mg | ORAL_TABLET | ORAL | Status: DC | PRN
  Administered 2017-09-22: 650 mg via ORAL
  Filled 2017-09-15: qty 2

## 2017-09-15 MED ORDER — METFORMIN HCL 500 MG PO TABS
1000.0000 mg | ORAL_TABLET | Freq: Two times a day (BID) | ORAL | Status: DC
  Administered 2017-09-16 – 2017-09-23 (×14): 1000 mg via ORAL
  Filled 2017-09-15 (×14): qty 2

## 2017-09-15 MED ORDER — HYDROCHLOROTHIAZIDE 25 MG PO TABS
25.0000 mg | ORAL_TABLET | ORAL | Status: DC
  Administered 2017-09-17 – 2017-09-23 (×4): 25 mg via ORAL
  Filled 2017-09-15 (×6): qty 1

## 2017-09-15 MED ORDER — CARVEDILOL 12.5 MG PO TABS
12.5000 mg | ORAL_TABLET | Freq: Every day | ORAL | Status: DC
  Administered 2017-09-16 – 2017-09-23 (×8): 12.5 mg via ORAL
  Filled 2017-09-15 (×8): qty 1

## 2017-09-15 MED ORDER — ACETAMINOPHEN 650 MG RE SUPP: 650.0000 mg | RECTAL | Status: DC | PRN

## 2017-09-15 MED ORDER — LORAZEPAM 2 MG/ML IJ SOLN
1.0000 mg | Freq: Once | INTRAMUSCULAR | Status: AC
  Administered 2017-09-15: 1 mg via INTRAVENOUS
  Filled 2017-09-15: qty 1

## 2017-09-15 MED ORDER — ACETAMINOPHEN 160 MG/5ML PO SOLN: 650.0000 mg | ORAL | Status: DC | PRN

## 2017-09-15 MED ORDER — STROKE: EARLY STAGES OF RECOVERY BOOK
Freq: Once | Status: AC
  Administered 2017-09-16: 10:00:00
  Filled 2017-09-15: qty 1

## 2017-09-15 MED ORDER — LEVOTHYROXINE SODIUM 75 MCG PO TABS
150.0000 ug | ORAL_TABLET | Freq: Every day | ORAL | Status: DC
  Administered 2017-09-16 – 2017-09-23 (×8): 150 ug via ORAL
  Filled 2017-09-15 (×8): qty 2

## 2017-09-15 MED ORDER — PRAVASTATIN SODIUM 20 MG PO TABS
20.0000 mg | ORAL_TABLET | Freq: Every day | ORAL | Status: DC
  Administered 2017-09-16 – 2017-09-22 (×7): 20 mg via ORAL
  Filled 2017-09-15 (×7): qty 1

## 2017-09-15 MED ORDER — PREDNISOLONE ACETATE 1 % OP SUSP
1.0000 [drp] | Freq: Two times a day (BID) | OPHTHALMIC | Status: DC
  Administered 2017-09-18 – 2017-09-23 (×11): 1 [drp] via OPHTHALMIC
  Filled 2017-09-15 (×2): qty 5

## 2017-09-15 NOTE — ED Triage Notes (Signed)
Pt reports muscle cramps starting in her neck and moving down L side. Pt reports weakness to Left side. Pt LSN 1300.

## 2017-09-15 NOTE — ED Provider Notes (Signed)
Medical screening examination/treatment/procedure(s) were conducted as a shared visit with non-physician practitioner(s) and myself.  I personally evaluated the patient during the encounter.  Clinical Impression:   Final diagnoses:  Left leg numbness    The patient is an elderly 82 year old female, was admitted 3 weeks ago with strokelike symptoms with facial droop and some slurred speech, her work-up was completed and she was discharged home and did well until today when she developed rather acute onset of left arm and leg difficulties including inability to walk because of left-sided weakness.  This is reported per family however this is gradually improved and at this time the patient feels some residual numbness in her left hand as well as weakness of the left hand.  On my exam she is able to straight leg raise bilaterally with normal strength, her grips bilaterally seem normal and she is able to pull however she does have slight dysmetria with the left upper extremity on finger-nose-finger testing.  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  According to the family member she appears getting close back to her baseline.  Per the neurology consultation notes the patient has been recommended to have an MRI, repeat work-up.  I discussed the case and the MRI findings with the neurologist Dr. Uvaldo RisingMcNeil her Luisa HartPatrick who recommends that the patient be admitted for EEG as well as physical therapy and Occupational Therapy.  Discussed with Dr. Julian ReilGardner of the hospitalist service who is agreeable.   EKG Interpretation  Date/Time:  Tuesday September 15 2017 15:33:19 EDT Ventricular Rate:  83 PR Interval:  154 QRS Duration: 96 QT Interval:  384 QTC Calculation: 451 R Axis:   -46 Text Interpretation:  Normal sinus rhythm Incomplete right bundle branch block Left anterior fascicular block Septal  infarct , age undetermined Abnormal ECG since last tracing no significant change Confirmed by Eber HongMiller, Yesica Kemler ([id number]) on 09/15/2017 4:48:31 PM           Eber HongMiller, Sheyanne Munley, MD 09/16/17 [phone]

## 2017-09-15 NOTE — Consult Note (Addendum)
Referring Physician: Dr. Sabra Heck    Chief Complaint: Acute onset of left lower extremity pain, numbness and weakness  HPI: Priscilla Tran is an 82 y.o. female who presented to the ED with a chief complaint of muscle cramp sensation starting in her neck and moving down the back of her left side of thorax, to her hip and down her left leg circumferentially without a sciatica-like or dermatomal distribution. She also reported weakness to her left upper and lower extremity, in addition to sensory loss involving the entirety of her LLE from the hip down. LKN was 1300. Code Stroke was called.    She takes ASA and Plavix at home.   LSN: 1300 tPA Given: No: After discussion of risks/benefits of IV tPA, the patient elected not to be treated with tPA.  NIHSS: 4  Past Medical History:  Diagnosis Date  . Diabetes mellitus   . Disc degeneration, lumbar    Dr. Mina Marble  . Glaucoma associated with ocular inflammation, severe stage    blind Right eye  . Hyperlipidemia   . Hypertension   . Hypothyroid   . SBO (small bowel obstruction) s/p lap LOA [id number] 03/31/2011    Past Surgical History:  Procedure Laterality Date  . EYE SURGERY  2008   glaucoma right eye. Per patient she has had multiple surgeries on right eye, resulting in scar tissue.  Marland Kitchen LAPAROSCOPY  04/04/2011   Procedure: LAPAROSCOPY DIAGNOSTIC;  Surgeon: Adin Hector, MD;  Location: [facility];  Service: General;  Laterality: N/A;  . NM MYOCAR PERF WALL MOTION  06/20/2010   Normal  . US ECHOCARDIOGRAPHY  05/06/2005   Mild MR, PI    Family History  Problem Relation Age of Onset  . Heart disease Mother   . Heart attack Mother   . Heart disease Father   . Heart attack Father   . Cancer Brother        kidney   Social History:  reports that she has never smoked. She has never used smokeless tobacco. She reports that she drinks alcohol. She reports that she does not use drugs.  Allergies:  Allergies  Allergen Reactions  . Lisinopril  Swelling    Face and throat. Breathing not affected per patient.    Home Medications:    ROS: No headache, vision loss, confusion, trouble talking, CP, or abdominal pain.   Physical Examination: Blood pressure (!) 141/81, pulse 91, temperature 99.2 F (37.3 C), temperature source Oral, resp. rate 18, SpO2 100 %.  HEENT: [place]/AT. Neck supple. Right eye with corneal opacity. Lungs: Respirations unlabored Ext: No edema  Neurologic Examination: Mental Status: Alert, oriented, thought content appropriate.  Speech fluent without evidence of aphasia.  Able to follow all commands without difficulty. Cranial Nerves: II:  Blind chronically in right eye. Left eye without visual field cut. Able to count fingers and fixate on examiner. Unable to assess left pupillary constriction due to left corneal opacity.  III,IV, VI: No ptosis. EOMI without nystagmus.  V,VII: Smile symmetric. Nearly constant dyskinetic movements of jaw and lips which patient states is chronic (states, "you noticed?"). Facial light touch and temperature sensation equal bilaterally.  VIII: hearing intact to voice. IX,X: Palate rises symmetrically XI: Symmetric XII: midline tongue extension  Motor: RUE and RLE 5/5 proximal and distal.  LUE: 4/5 deltoid and triceps, otherwise 5/5 with no drift LLE: 4/5 proximal and distal with drift Dyskinetic occasional movements of upper extremities involving all joints proximally and distally, worse when she appears  to be under emotional stress from questions and commands. Less prominent fidgety movements of BLE.  Sensory: FT and temp intact bilateral upper extremities. Normal temp and FT RLE. Absent temp and FT LLE. Also with impaired light pressure sensation LLE circumferentially, proximal and distal. Has allodynia to moderate pressure on muscle beds of LLE.  Deep Tendon Reflexes:  Bilateral upper extremities 2+ biceps and brachioradialis Bilateral lower extremities 2+ patellae and  achilles Toes downgoing.  Cerebellar: Mild action tremor RUE without ataxia. Prominent dysmetria with LUE on FNF No ataxia with right H-S. Unable to definitively assess LLE H-S due to pain.  Gait: Deferred  Results for orders placed or performed during the hospital encounter of 09/15/17 (from the past 48 hour(s))  Protime-INR     Status: None   Collection Time: 09/15/17  3:31 PM  Result Value Ref Range   Prothrombin Time 12.7 11.4 - 15.2 seconds   INR 0.96     Comment: Performed at [facility], [place] [address], [place], [place] [address]  APTT     Status: None   Collection Time: 09/15/17  3:31 PM  Result Value Ref Range   aPTT 28 24 - 36 seconds    Comment: Performed at [facility] [address] [place], [place] [address]  CBC     Status: None   Collection Time: 09/15/17  3:31 PM  Result Value Ref Range   WBC 7.2 4.0 - 10.5 K/uL   RBC 4.66 3.87 - 5.11 MIL/uL   Hemoglobin 12.4 12.0 - 15.0 g/dL   HCT 38.3 36.0 - 46.0 %   MCV 82.2 78.0 - 100.0 fL   MCH 26.6 26.0 - 34.0 pg   MCHC 32.4 30.0 - 36.0 g/dL   RDW 14.6 11.5 - 15.5 %   Platelets 228 150 - 400 K/uL    Comment: Performed at [facility], [place] [address], [place], [place] [address]  Differential     Status: None   Collection Time: 09/15/17  3:31 PM  Result Value Ref Range   Neutrophils Relative % 67 %   Neutro Abs 4.9 1.7 - 7.7 K/uL   Lymphocytes Relative 20 %   Lymphs Abs 1.4 0.7 - 4.0 K/uL   Monocytes Relative 9 %   Monocytes Absolute 0.7 0.1 - 1.0 K/uL   Eosinophils Relative 3 %   Eosinophils Absolute 0.2 0.0 - 0.7 K/uL   Basophils Relative 1 %   Basophils Absolute 0.1 0.0 - 0.1 K/uL   Immature Granulocytes 0 %   Abs Immature Granulocytes 0.0 0.0 - 0.1 K/uL    Comment: Performed at [facility] [address] [place], [place] [address]  Comprehensive metabolic panel     Status: Abnormal   Collection Time: 09/15/17  3:31 PM  Result Value Ref Range   Sodium 138 135 - 145 mmol/L    Potassium 3.7 3.5 - 5.1 mmol/L   Chloride 101 98 - 111 mmol/L   CO2 26 22 - 32 mmol/L   Glucose, Bld 161 (H) 70 - 99 mg/dL   BUN 11 8 - 23 mg/dL   Creatinine, Ser 0.62 0.44 - 1.00 mg/dL   Calcium 10.0 8.9 - 10.3 mg/dL   Total Protein 7.7 6.5 - 8.1 g/dL   Albumin 4.5 3.5 - 5.0 g/dL   AST 21 15 - 41 U/L   ALT 12 0 - 44 U/L   Alkaline Phosphatase 66 38 - 126 U/L   Total  Bilirubin 0.8 0.3 - 1.2 mg/dL   GFR calc non Af Amer >60 >60 mL/min   GFR calc Af Amer >60 >60 mL/min    Comment: (NOTE) The eGFR has been calculated using the CKD EPI equation. This calculation has not been validated in all clinical situations. eGFR's persistently <60 mL/min signify possible Chronic Kidney Disease.    Anion gap 11 5 - 15    Comment: Performed at [facility] [address] [place], [place] [address]  I-stat troponin, ED     Status: None   Collection Time: 09/15/17  3:36 PM  Result Value Ref Range   Troponin i, poc 0.00 0.00 - 0.08 ng/mL   Comment 3            Comment: Due to the release kinetics of cTnI, a negative result within the first hours of the onset of symptoms does not rule out myocardial infarction with certainty. If myocardial infarction is still suspected, repeat the test at appropriate intervals.   I-Stat Chem 8, ED     Status: Abnormal   Collection Time: 09/15/17  3:37 PM  Result Value Ref Range   Sodium 137 135 - 145 mmol/L   Potassium 3.6 3.5 - 5.1 mmol/L   Chloride 98 98 - 111 mmol/L   BUN 12 8 - 23 mg/dL   Creatinine, Ser 0.50 0.44 - 1.00 mg/dL   Glucose, Bld 157 (H) 70 - 99 mg/dL   Calcium, Ion 1.11 (L) 1.15 - 1.40 mmol/L   TCO2 25 22 - 32 mmol/L   Hemoglobin 13.6 12.0 - 15.0 g/dL   HCT 40.0 36.0 - 46.0 %   Ct Head Code Stroke Wo Contrast  Result Date: 09/15/2017 CLINICAL DATA:  Code stroke.  Left-sided weakness with unsteady gait EXAM: CT HEAD WITHOUT CONTRAST TECHNIQUE: Contiguous axial images were obtained from the base of the skull through the vertex  without intravenous contrast. COMPARISON:  08/23/2017 FINDINGS: Brain: No evidence of acute infarction, hemorrhage, hydrocephalus, extra-axial collection or mass lesion/mass effect. Generalized atrophy and periventricular chronic small vessel ischemia that is mild for age. Vascular: No hyperdense vessel.  Atherosclerotic calcification. Skull: No acute finding.  Left C1-2 severe facet arthropathy. Sinuses/Orbits: Bilateral glaucoma reservoir. Cataract resection bilateral Other: These results were communicated to Dr. Leonie Man at 3:47 pmon 8/20/2019by text page via the [facility] messaging system. ASPECTS [facility] Stroke Program Early CT Score) - Ganglionic level infarction (caudate, lentiform nuclei, internal capsule, insula, M1-M3 cortex): 7 - Supraganglionic infarction (M4-M6 cortex): 3 Total score (0-10 with 10 being normal): 10 IMPRESSION: No acute finding.  ASPECTS is 10. Electronically Signed   By: Monte Fantasia M.D.   On: 09/15/2017 15:48    Assessment: 82 y.o. female presenting with unusual constellation of diffuse dyskinetic movements, mild LUE and LLE weakness and complete sensory loss to FT and temperature involving her LLE, but preservation of deep pressure sensation. 1. DDx for weakness, sensory loss and ataxia includes stroke and psychogenic etiology. DDx when considering weakness and sensory loss alone in conjunction with pain includes left psoas hematoma and lumbar vertebral fracture with compressive lumbar polyradiculopathy 2. Stroke Risk Factors - DM, HLD, HTN 3. After discussion of possible diagnoses, with stroke being possible but other DDx also relatively likely, in addition to risks/benefits of tPA administration including possible disabling hemorrhage, the patient elected not to be treated with tPA.   Plan: 1. HgbA1c, fasting lipid panel 2. MRI, MRA of the brain without contrast 3. PT consult, OT consult, Speech  consult 4. Echocardiogram 5. Carotid dopplers 6. Prophylactic therapy-  Continue Plavix and ASA. If stroke verified on MRI, switch Mevacor to 40 mg qd Lipitor 7. Risk factor modification 8. Telemetry monitoring 9. Frequent neuro checks 10. If MRI brain is negative, obtain MRI of lumbar spine and CT of pelvis   _0  signed: Dr. Kerney Elbe  09/15/2017, 4:11 PM

## 2017-09-15 NOTE — ED Provider Notes (Signed)
[facility]   CSN: [id number] Arrival date & time: 09/15/17  1518   History   Chief Complaint Chief Complaint  Patient presents with  . Code Stroke    HPI Asencion GowdaRita L Traeger is a 82 y.o. female with a history significant for TIA on 08/22/17, hypercholesterolemia, diabetes and hypertension presents for evaluation of neck pain and unilateral weakness extending which started at 1300 today. On Plavix, last dose this morning. Patients daughter states patient called her around 1 pm today with complaints of neck pain that radiated to her arm.  Daughter states she called her neighbor to bring her mother to the ED and at that time patient was unable to ambulate secondary to weakness in her left side. Denies facial droop, slurred speech, chest pain, headache, vision changes, SOB, abdominal pain. Per patient family, patient is back to baseline on evaluation. CT cervical spine from 08/23/17 showed advanced cervical spine degeneration with cord impingement likely from C3-4 to C6-7. Spinal stenosis is severe at C5-6 with probable advanced cord flattening. Admits to dysuria x 2 days. Was previously on Cipro in July for UTI.  Past Medical History:  Diagnosis Date  . Diabetes mellitus   . Disc degeneration, lumbar    Dr. Regino SchultzeWang  . Glaucoma associated with ocular inflammation, severe stage    blind Right eye  . Hyperlipidemia   . Hypertension   . Hypothyroid   . SBO (small bowel obstruction) s/p lap LOA [id number] 03/31/2011    Patient Active Problem List   Diagnosis Date Noted  . TIA (transient ischemic attack) 08/22/2017  . Chronic constipation 05/07/2011  . Diabetes type 2, controlled (HCC) 03/31/2011  . Hypothyroid   . Glaucoma associated with ocular inflammation, severe stage   . Disc degeneration, lumbar   . Hypertension     Past Surgical History:  Procedure Laterality Date  . EYE SURGERY  2008   glaucoma right eye. Per patient she has had  multiple surgeries on right eye, resulting in scar tissue.  Marland Kitchen. LAPAROSCOPY  04/04/2011   Procedure: LAPAROSCOPY DIAGNOSTIC;  Surgeon: Ardeth SportsmanSteven C. Gross, MD;  Location: [facility];  Service: General;  Laterality: N/A;  . NM MYOCAR PERF WALL MOTION  06/20/2010   Normal  . US ECHOCARDIOGRAPHY  05/06/2005   Mild MR, PI     OB History   None      Home Medications    Prior to Admission medications   Medication Sig Start Date End Date Taking? Authorizing Provider  amLODipine (NORVASC) 10 MG tablet Take 10 mg by mouth daily. 07/28/17  Yes [provider]  aspirin EC 81 MG tablet Take 81 mg by mouth daily.   Yes [provider]  carvedilol (COREG) 12.5 MG tablet Take 12.5 mg by mouth daily.    Yes [provider]  clopidogrel (PLAVIX) 75 MG tablet Take 1 tablet (75 mg total) by mouth daily. 08/25/17  Yes Osvaldo ShipperKrishnan, Gokul, MD  Cyanocobalamin (VITAMIN B-12 PO) Take 1 tablet by mouth daily.   Yes [provider]  dorzolamide-timolol (COSOPT) 22.3-6.8 MG/ML ophthalmic solution Place 1 drop into the left eye 2 (two) times daily.    Yes [provider]  hydrochlorothiazide (HYDRODIURIL) 25 MG tablet Take 25 mg by mouth every other day.   Yes [provider]  levothyroxine (SYNTHROID, LEVOTHROID) 150 MCG tablet Take 150 mcg by mouth daily.   Yes [provider]  lovastatin (MEVACOR) 10 MG tablet Take 2 tablets (20 mg  total) by mouth at bedtime. 08/24/17  Yes Osvaldo ShipperKrishnan, Gokul, MD  metFORMIN (GLUCOPHAGE) 1000 MG tablet Take 1,000 mg by mouth 2 (two) times daily with a meal.   Yes [provider]  prednisoLONE acetate (PRED FORTE) 1 % ophthalmic suspension Place 1 drop into the left eye 2 (two) times daily.   Yes [provider]    Family History Family History  Problem Relation Age of Onset  . Heart disease Mother   . Heart attack Mother   . Heart disease Father   . Heart attack Father   . Cancer Brother        kidney    Social  History Social History   Tobacco Use  . Smoking status: Never Smoker  . Smokeless tobacco: Never Used  Substance Use Topics  . Alcohol use: Yes    Comment: occasional/social on weekend  . Drug use: No     Allergies   Lisinopril   Review of Systems Review of Systems  Constitutional: Negative for chills, diaphoresis and fever.  Genitourinary: Positive for difficulty urinating. Negative for flank pain and hematuria.  Musculoskeletal: Positive for gait problem. Negative for back pain, neck pain and neck stiffness.  Skin: Negative for color change.  Neurological: Positive for weakness. Negative for dizziness, tremors, syncope, facial asymmetry, speech difficulty, light-headedness and numbness.  All other systems reviewed and are negative.    Physical Exam Updated Vital Signs BP 119/68   Pulse 79   Temp 99.2 F (37.3 C) (Oral)   Resp 20   SpO2 98%   Physical Exam  Constitutional: Pt is oriented to person, place, and time. No distress.  HENT:  Head: Normocephalic and atraumatic.  Nose: Nose normal.  Mouth/Throat: Uvula is midline, oropharynx is clear and moist and mucous membranes are normal.  Eyes: Conjunctivae normal.  Neck: No spinous process tenderness and no muscular tenderness present. No rigidity. Normal range of motion present.  Cardiovascular: Normal rate, regular rhythm and intact distal pulses.   Pulmonary/Chest: Effort normal and breath sounds normal. No accessory muscle usage. No respiratory distress. No decreased breath sounds. No wheezes.  Abdominal: Soft. Normal appearance and bowel sounds are normal. There is no tenderness.  Musculoskeletal: Normal range of motion.       Thoracic back: Exhibits normal range of motion.       Lumbar back: Exhibits normal range of motion.  Neurological: Pt is alert and oriented to person, place, and time. Speech fluent without aphasia follows commands. No cranial nerve deficit. Right eye hazed over with no vision. 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 No facial droop.  5/5 strength in upper extremities bilaterally,  strong and equal grip strength. 4/ strength to lower extremities. Dyskinetic movements of upper extremities.  Dysmetria of left upper extremity with finger to nose. Normal straight leg raise and heel to shin. Skin: Skin is warm and dry. No rash noted. Pt is not diaphoretic. No erythema.  Psychiatric: Normal mood and affect.  Nursing note and vitals reviewed.   ED Treatments / Results  Labs (all labs ordered are listed, but only abnormal results are displayed) Labs Reviewed  COMPREHENSIVE METABOLIC PANEL - Abnormal; Notable for the following components:      Result Value   Glucose, Bld 161 (*)    All other components within normal limits  URINALYSIS, ROUTINE W REFLEX MICROSCOPIC - Abnormal; Notable for the following components:   Color, Urine COLORLESS (*)    Specific Gravity, Urine 1.003 (*)  All other components within normal limits  I-STAT CHEM 8, ED - Abnormal; Notable for the following components:   Glucose, Bld 157 (*)    Calcium, Ion 1.11 (*)    All other components within normal limits  PROTIME-INR  APTT  CBC  DIFFERENTIAL  I-STAT TROPONIN, ED    EKG EKG Interpretation  Date/Time:  Tuesday September 15 2017 15:33:19 EDT Ventricular Rate:  83 PR Interval:  154 QRS Duration: 96 QT Interval:  384 QTC Calculation: 451 R Axis:   -46 Text Interpretation:  Normal sinus rhythm Incomplete right bundle branch block Left anterior fascicular block Septal infarct , age undetermined Abnormal ECG since last tracing no significant change Confirmed by Eber Hong ([id number]) on 09/15/2017 4:48:31 PM   Radiology Mr Maxine Glenn Head Wo Contrast  Result Date: 09/15/2017 CLINICAL DATA:  82 y/o  F; left-sided weakness. EXAM: MRI HEAD WITHOUT CONTRAST MRA HEAD WITHOUT CONTRAST TECHNIQUE: Multiplanar, multiecho pulse sequences of the brain and surrounding structures were obtained without  intravenous contrast. Angiographic images of the head were obtained using MRA technique without contrast. COMPARISON:  09/15/2017 CT head. FINDINGS: MRI HEAD FINDINGS Brain: No acute infarction, hydrocephalus, extra-axial collection or mass lesion. Several stable nonspecific T2 FLAIR hyperintensities in subcortical and periventricular white matter are compatible with mild chronic microvascular ischemic changes for age. Mild volume loss of the brain. There is sulcal susceptibility hypointensity within the sylvian fissures bilaterally as well as punctate foci within the occipital horns of lateral ventricles and within the fourth ventricle (series 10, image 54, 48, 27). Findings are compatible with cirrhosis. Susceptibility hypointensity within the sylvian fissures is stable from MRI of the brain. Punctate foci within the lateral and fourth ventricle are newly apparent. Vascular: As below. Skull and upper cervical spine: Normal marrow signal. Sinuses/Orbits: Negative. Other: None. MRA HEAD FINDINGS Anterior circulation: Left paraophthalmic artery 2 mm inferiorly directed outpouching (series 551, image 7 and series 5, image 92) possibly representing a tiny aneurysm versus is prominent infundibulum of diminutive vessel. No large vessel occlusion significant stenosis is identified. Posterior circulation: Left P3 superior division origin severe stenosis. No large vessel occlusion, aneurysm, or additional segment of significant stenosis is identified. Anatomic variant: Bilateral fetal PCA. IMPRESSION: MRI head: 1. Stable mild siderosis within the sylvian fissures. Newly apparent tiny foci of blood products within the occipital horns of lateral ventricles and fourth ventricle are probably due to differences in technique with the current SWAN being more sensitive than the prior SWI. New hemorrhage is less likely. 2. No stroke, mass effect, or herniation. 3. Stable mild chronic microvascular ischemic changes and volume loss of  the brain. MRA head: 1. Stable left P3 superior division severe stenosis. 2. No large vessel occlusion or new segment of significant stenosis is identified. 3. 2 mm left paraophthalmic ICA inferiorly directed outpouching may represent a tiny aneurysm or the infundibular origin of a diminutive vessel. Electronically Signed   By: Mitzi Hansen M.D.   On: 09/15/2017 21:42   Mr Brain Wo Contrast  Result Date: 09/15/2017 CLINICAL DATA:  82 y/o  F; left-sided weakness. EXAM: MRI HEAD WITHOUT CONTRAST MRA HEAD WITHOUT CONTRAST TECHNIQUE: Multiplanar, multiecho pulse sequences of the brain and surrounding structures were obtained without intravenous contrast. Angiographic images of the head were obtained using MRA technique without contrast. COMPARISON:  09/15/2017 CT head. FINDINGS: MRI HEAD FINDINGS Brain: No acute infarction, hydrocephalus, extra-axial collection or mass lesion. Several stable nonspecific T2 FLAIR hyperintensities in subcortical and periventricular white matter are compatible with  mild chronic microvascular ischemic changes for age. Mild volume loss of the brain. There is sulcal susceptibility hypointensity within the sylvian fissures bilaterally as well as punctate foci within the occipital horns of lateral ventricles and within the fourth ventricle (series 10, image 54, 48, 27). Findings are compatible with cirrhosis. Susceptibility hypointensity within the sylvian fissures is stable from MRI of the brain. Punctate foci within the lateral and fourth ventricle are newly apparent. Vascular: As below. Skull and upper cervical spine: Normal marrow signal. Sinuses/Orbits: Negative. Other: None. MRA HEAD FINDINGS Anterior circulation: Left paraophthalmic artery 2 mm inferiorly directed outpouching (series 551, image 7 and series 5, image 92) possibly representing a tiny aneurysm versus is prominent infundibulum of diminutive vessel. No large vessel occlusion significant stenosis is identified.  Posterior circulation: Left P3 superior division origin severe stenosis. No large vessel occlusion, aneurysm, or additional segment of significant stenosis is identified. Anatomic variant: Bilateral fetal PCA. IMPRESSION: MRI head: 1. Stable mild siderosis within the sylvian fissures. Newly apparent tiny foci of blood products within the occipital horns of lateral ventricles and fourth ventricle are probably due to differences in technique with the current SWAN being more sensitive than the prior SWI. New hemorrhage is less likely. 2. No stroke, mass effect, or herniation. 3. Stable mild chronic microvascular ischemic changes and volume loss of the brain. MRA head: 1. Stable left P3 superior division severe stenosis. 2. No large vessel occlusion or new segment of significant stenosis is identified. 3. 2 mm left paraophthalmic ICA inferiorly directed outpouching may represent a tiny aneurysm or the infundibular origin of a diminutive vessel. Electronically Signed   By: Mitzi Hansen M.D.   On: 09/15/2017 21:42   Ct Head Code Stroke Wo Contrast  Result Date: 09/15/2017 CLINICAL DATA:  Code stroke.  Left-sided weakness with unsteady gait EXAM: CT HEAD WITHOUT CONTRAST TECHNIQUE: Contiguous axial images were obtained from the base of the skull through the vertex without intravenous contrast. COMPARISON:  08/23/2017 FINDINGS: Brain: No evidence of acute infarction, hemorrhage, hydrocephalus, extra-axial collection or mass lesion/mass effect. Generalized atrophy and periventricular chronic small vessel ischemia that is mild for age. Vascular: No hyperdense vessel.  Atherosclerotic calcification. Skull: No acute finding.  Left C1-2 severe facet arthropathy. Sinuses/Orbits: Bilateral glaucoma reservoir. Cataract resection bilateral Other: These results were communicated to Dr. Pearlean Brownie at 3:47 pmon 8/20/2019by text page via the [facility] messaging system. ASPECTS [facility] Stroke Program Early CT Score) - Ganglionic  level infarction (caudate, lentiform nuclei, internal capsule, insula, M1-M3 cortex): 7 - Supraganglionic infarction (M4-M6 cortex): 3 Total score (0-10 with 10 being normal): 10 IMPRESSION: No acute finding.  ASPECTS is 10. Electronically Signed   By: Marnee Spring M.D.   On: 09/15/2017 15:48    Procedures Procedures (including critical care time)  Medications Ordered in ED LORazepam (ATIVAN) injection 1 mg (1 mg Intravenous Given 09/15/17 2033)  Initial Impression / Assessment and Plan / ED Course  I have reviewed the triage vital signs and the nursing notes as well as the past medical history.  Pertinent labs & imaging results that were available during my care of the patient were reviewed by me and considered in my medical decision making (see chart for details).  82 year old for evaluation of acute onset left sided weakness. Last know normal 1pm. Previous TIA on 08/23/17 with workup. Code Stroke activated. Dysuria x 2 days. Will get urine to r/o UTI given recent hx.  On evaluation patient with dysmetria of upper extremity. No other  focal neuro deficits. CT no acute findings. Dr. Otelia Limes has consulted with patient.  Recommended MRI and MRA brain. Mildly elevated glucose.  Troponin negative. Urinalysis negative.   EKG Interpretation  Date/Time:  Tuesday September 15 2017 15:33:19 EDT Ventricular Rate:  83 PR Interval:  154 QRS Duration: 96 QT Interval:  384 QTC Calculation: 451 R Axis:   -46 Text Interpretation:  Normal sinus rhythm Incomplete right bundle branch block Left anterior fascicular block Septal infarct , age undetermined Abnormal ECG since last tracing no significant change Confirmed by Eber Hong ([id number]) on 09/15/2017 4:48:31 PM     MRI/MRA findings discussed with Dr. Onalee Hua with Neurology. Agrees patient needs admission for EEG, PT and OT  given MRI/MRA findings.  Dr. Julian Reil with [facility] consulted for admission and will admit for further  workup.   Final Clinical Impressions(s) / ED Diagnoses   Final diagnoses:  Left leg numbness    ED Discharge Orders    None       Henderly, Britni A, PA-C 09/15/17 2319    Eber Hong, MD 09/16/17 [phone]

## 2017-09-15 NOTE — ED Notes (Signed)
Patient transported to MRI 

## 2017-09-15 NOTE — H&P (Signed)
History and Physical    Priscilla Tran:[id number] DOB: 1935-11-30 DOA: 09/15/2017  PCP: Tracey Harries, MD  Patient coming from: Home  I have personally briefly reviewed patient's old medical records in [facility]  Chief Complaint: L sided weakness  HPI: Priscilla Tran is a 82 y.o. 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  Patient was admitted on 7/27-7/29 for TIA work up for L sided weakness that resolved.  It appears during that admit that work up for TIA was negative.  However, of interest it appears that CT C spine was performed later during the admit which showed "likely cord compression".  Patient ultimately was discharged on the 29th as she was asymptomatic.  Today around 1300 patient developed L leg weakness and numbness, and L arm weakness.  Symptoms have improved some since onset but are persistent.   ED Course: In the ED MRI brain shows: "Newly apparent tiny foci of blood products within the occipital horns of lateral ventricles and fourth ventricle are probably due to differences in technique with the current SWAN being more sensitive than the prior SWI. New hemorrhage is less likely."  No acute stroke on MRI.  Neuro consulted and hospitalist asked to admit.   Review of Systems: As per HPI otherwise 10 point review of systems negative.   Past Medical History:  Diagnosis Date  . Diabetes mellitus   . Disc degeneration, lumbar    Dr. Regino Schultze  . Glaucoma associated with ocular inflammation, severe stage    blind Right eye  . Hyperlipidemia   . Hypertension   . Hypothyroid   . SBO (small bowel obstruction) s/p lap LOA [id number] 03/31/2011    Past Surgical History:  Procedure Laterality Date  . EYE SURGERY  2008   glaucoma right eye. Per patient she has had multiple surgeries on right eye, resulting in scar tissue.  Marland Kitchen LAPAROSCOPY  04/04/2011   Procedure: LAPAROSCOPY DIAGNOSTIC;  Surgeon: Ardeth Sportsman, MD;  Location: [facility];   Service: General;  Laterality: N/A;  . NM MYOCAR PERF WALL MOTION  06/20/2010   Normal  . US ECHOCARDIOGRAPHY  05/06/2005   Mild MR, PI     reports that she has never smoked. She has never used smokeless tobacco. She reports that she drinks alcohol. She reports that she does not use drugs.  Allergies  Allergen Reactions  . Lisinopril Swelling    Face and throat. Breathing not affected per patient.    Family History  Problem Relation Age of Onset  . Heart disease Mother   . Heart attack Mother   . Heart disease Father   . Heart attack Father   . Cancer Brother        kidney     Prior to Admission medications   Medication Sig Start Date End Date Taking? Authorizing Provider  amLODipine (NORVASC) 10 MG tablet Take 10 mg by mouth daily. 07/28/17  Yes [provider]  aspirin EC 81 MG tablet Take 81 mg by mouth daily.   Yes [provider]  carvedilol (COREG) 12.5 MG tablet Take 12.5 mg by mouth daily.    Yes [provider]  clopidogrel (PLAVIX) 75 MG tablet Take 1 tablet (75 mg total) by mouth daily. 08/25/17  Yes Osvaldo Shipper, MD  Cyanocobalamin (VITAMIN B-12 PO) Take 1 tablet by mouth daily.   Yes [provider]  dorzolamide-timolol (COSOPT) 22.3-6.8 MG/ML ophthalmic solution Place 1 drop into the left  eye 2 (two) times daily.    Yes [provider]  hydrochlorothiazide (HYDRODIURIL) 25 MG tablet Take 25 mg by mouth every other day.   Yes [provider]  levothyroxine (SYNTHROID, LEVOTHROID) 150 MCG tablet Take 150 mcg by mouth daily.   Yes [provider]  lovastatin (MEVACOR) 10 MG tablet Take 2 tablets (20 mg total) by mouth at bedtime. 08/24/17  Yes Osvaldo Shipper, MD  metFORMIN (GLUCOPHAGE) 1000 MG tablet Take 1,000 mg by mouth 2 (two) times daily with a meal.   Yes [provider]  prednisoLONE acetate (PRED FORTE) 1 % ophthalmic suspension Place 1 drop into the left eye 2 (two) times daily.   Yes  [provider]    Physical Exam: Vitals:   09/15/17 2015 09/15/17 2030 09/15/17 2230 09/15/17 2232  BP: 126/66 (!) 141/72 119/68   Pulse: 69 77  79  Resp: 15 18 20 20   Temp:      TempSrc:      SpO2: 99% 99%  98%    Constitutional: NAD, calm, comfortable Eyes: PERRL, lids and conjunctivae normal ENMT: Mucous membranes are moist. Posterior pharynx clear of any exudate or lesions.Normal dentition.  Neck: normal, supple, no masses, no thyromegaly Respiratory: clear to auscultation bilaterally, no wheezing, no crackles. Normal respiratory effort. No accessory muscle use.  Cardiovascular: Regular rate and rhythm, no murmurs / rubs / gallops. No extremity edema. 2+ pedal pulses. No carotid bruits.  Abdomen: no tenderness, no masses palpated. No hepatosplenomegaly. Bowel sounds positive.  Musculoskeletal: no clubbing / cyanosis. No joint deformity upper and lower extremities. Good ROM, no contractures. Normal muscle tone.  Skin: no rashes, lesions, ulcers. No induration Neurologic: LUE 4/5 deltoid and triceps, LLE 4/5 Psychiatric: Normal judgment and insight. Alert and oriented x 3. Normal mood.    Labs on Admission: I have personally reviewed following labs and imaging studies  CBC: Recent Labs  Lab 09/15/17 1531 09/15/17 1537  WBC 7.2  --   NEUTROABS 4.9  --   HGB 12.4 13.6  HCT 38.3 40.0  MCV 82.2  --   PLT 228  --    Basic Metabolic Panel: Recent Labs  Lab 09/15/17 1531 09/15/17 1537  NA 138 137  K 3.7 3.6  CL 101 98  CO2 26  --   GLUCOSE 161* 157*  BUN 11 12  CREATININE 0.62 0.50  CALCIUM 10.0  --    GFR: CrCl cannot be calculated (Unknown ideal weight.). Liver Function Tests: Recent Labs  Lab 09/15/17 1531  AST 21  ALT 12  ALKPHOS 66  BILITOT 0.8  PROT 7.7  ALBUMIN 4.5   No results for input(s): LIPASE, AMYLASE in the last 168 hours. No results for input(s): AMMONIA in the last 168 hours. Coagulation Profile: Recent Labs  Lab  09/15/17 1531  INR 0.96   Cardiac Enzymes: No results for input(s): CKTOTAL, CKMB, CKMBINDEX, TROPONINI in the last 168 hours. BNP (last 3 results) No results for input(s): PROBNP in the last 8760 hours. HbA1C: No results for input(s): HGBA1C in the last 72 hours. CBG: No results for input(s): GLUCAP in the last 168 hours. Lipid Profile: No results for input(s): CHOL, HDL, LDLCALC, TRIG, CHOLHDL, LDLDIRECT in the last 72 hours. Thyroid Function Tests: No results for input(s): TSH, T4TOTAL, FREET4, T3FREE, THYROIDAB in the last 72 hours. Anemia Panel: No results for input(s): VITAMINB12, FOLATE, FERRITIN, TIBC, IRON, RETICCTPCT in the last 72 hours. Urine analysis:    Component Value Date/Time  COLORURINE COLORLESS (A) 09/15/2017 1849   APPEARANCEUR CLEAR 09/15/2017 1849   LABSPEC 1.003 (L) 09/15/2017 1849   PHURINE 7.0 09/15/2017 1849   GLUCOSEU NEGATIVE 09/15/2017 1849   HGBUR NEGATIVE 09/15/2017 1849   BILIRUBINUR NEGATIVE 09/15/2017 1849   BILIRUBINUR neg 03/30/2011 1924   KETONESUR NEGATIVE 09/15/2017 1849   PROTEINUR NEGATIVE 09/15/2017 1849   UROBILINOGEN 0.2 03/31/2011 0807   NITRITE NEGATIVE 09/15/2017 1849   LEUKOCYTESUR NEGATIVE 09/15/2017 1849    Radiological Exams on Admission: Mr Maxine Glenn Head Wo Contrast  Result Date: 09/15/2017 CLINICAL DATA:  82 y/o  F; left-sided weakness. EXAM: MRI HEAD WITHOUT CONTRAST MRA HEAD WITHOUT CONTRAST TECHNIQUE: Multiplanar, multiecho pulse sequences of the brain and surrounding structures were obtained without intravenous contrast. Angiographic images of the head were obtained using MRA technique without contrast. COMPARISON:  09/15/2017 CT head. FINDINGS: MRI HEAD FINDINGS Brain: No acute infarction, hydrocephalus, extra-axial collection or mass lesion. Several stable nonspecific T2 FLAIR hyperintensities in subcortical and periventricular white matter are compatible with mild chronic microvascular ischemic changes for age. Mild  volume loss of the brain. There is sulcal susceptibility hypointensity within the sylvian fissures bilaterally as well as punctate foci within the occipital horns of lateral ventricles and within the fourth ventricle (series 10, image 54, 48, 27). Findings are compatible with cirrhosis. Susceptibility hypointensity within the sylvian fissures is stable from MRI of the brain. Punctate foci within the lateral and fourth ventricle are newly apparent. Vascular: As below. Skull and upper cervical spine: Normal marrow signal. Sinuses/Orbits: Negative. Other: None. MRA HEAD FINDINGS Anterior circulation: Left paraophthalmic artery 2 mm inferiorly directed outpouching (series 551, image 7 and series 5, image 92) possibly representing a tiny aneurysm versus is prominent infundibulum of diminutive vessel. No large vessel occlusion significant stenosis is identified. Posterior circulation: Left P3 superior division origin severe stenosis. No large vessel occlusion, aneurysm, or additional segment of significant stenosis is identified. Anatomic variant: Bilateral fetal PCA. IMPRESSION: MRI head: 1. Stable mild siderosis within the sylvian fissures. Newly apparent tiny foci of blood products within the occipital horns of lateral ventricles and fourth ventricle are probably due to differences in technique with the current SWAN being more sensitive than the prior SWI. New hemorrhage is less likely. 2. No stroke, mass effect, or herniation. 3. Stable mild chronic microvascular ischemic changes and volume loss of the brain. MRA head: 1. Stable left P3 superior division severe stenosis. 2. No large vessel occlusion or new segment of significant stenosis is identified. 3. 2 mm left paraophthalmic ICA inferiorly directed outpouching may represent a tiny aneurysm or the infundibular origin of a diminutive vessel. Electronically Signed   By: Mitzi Hansen M.D.   On: 09/15/2017 21:42   Mr Brain Wo Contrast  Result Date:  09/15/2017 CLINICAL DATA:  81 y/o  F; left-sided weakness. EXAM: MRI HEAD WITHOUT CONTRAST MRA HEAD WITHOUT CONTRAST TECHNIQUE: Multiplanar, multiecho pulse sequences of the brain and surrounding structures were obtained without intravenous contrast. Angiographic images of the head were obtained using MRA technique without contrast. COMPARISON:  09/15/2017 CT head. FINDINGS: MRI HEAD FINDINGS Brain: No acute infarction, hydrocephalus, extra-axial collection or mass lesion. Several stable nonspecific T2 FLAIR hyperintensities in subcortical and periventricular white matter are compatible with mild chronic microvascular ischemic changes for age. Mild volume loss of the brain. There is sulcal susceptibility hypointensity within the sylvian fissures bilaterally as well as punctate foci within the occipital horns of lateral ventricles and within the fourth ventricle (series 10, image 54, 48,  27). Findings are compatible with cirrhosis. Susceptibility hypointensity within the sylvian fissures is stable from MRI of the brain. Punctate foci within the lateral and fourth ventricle are newly apparent. Vascular: As below. Skull and upper cervical spine: Normal marrow signal. Sinuses/Orbits: Negative. Other: None. MRA HEAD FINDINGS Anterior circulation: Left paraophthalmic artery 2 mm inferiorly directed outpouching (series 551, image 7 and series 5, image 92) possibly representing a tiny aneurysm versus is prominent infundibulum of diminutive vessel. No large vessel occlusion significant stenosis is identified. Posterior circulation: Left P3 superior division origin severe stenosis. No large vessel occlusion, aneurysm, or additional segment of significant stenosis is identified. Anatomic variant: Bilateral fetal PCA. IMPRESSION: MRI head: 1. Stable mild siderosis within the sylvian fissures. Newly apparent tiny foci of blood products within the occipital horns of lateral ventricles and fourth ventricle are probably due to  differences in technique with the current SWAN being more sensitive than the prior SWI. New hemorrhage is less likely. 2. No stroke, mass effect, or herniation. 3. Stable mild chronic microvascular ischemic changes and volume loss of the brain. MRA head: 1. Stable left P3 superior division severe stenosis. 2. No large vessel occlusion or new segment of significant stenosis is identified. 3. 2 mm left paraophthalmic ICA inferiorly directed outpouching may represent a tiny aneurysm or the infundibular origin of a diminutive vessel. Electronically Signed   By: Mitzi HansenLance  Furusawa-Stratton M.D.   On: 09/15/2017 21:42   Ct Head Code Stroke Wo Contrast  Result Date: 09/15/2017 CLINICAL DATA:  Code stroke.  Left-sided weakness with unsteady gait EXAM: CT HEAD WITHOUT CONTRAST TECHNIQUE: Contiguous axial images were obtained from the base of the skull through the vertex without intravenous contrast. COMPARISON:  08/23/2017 FINDINGS: Brain: No evidence of acute infarction, hemorrhage, hydrocephalus, extra-axial collection or mass lesion/mass effect. Generalized atrophy and periventricular chronic small vessel ischemia that is mild for age. Vascular: No hyperdense vessel.  Atherosclerotic calcification. Skull: No acute finding.  Left C1-2 severe facet arthropathy. Sinuses/Orbits: Bilateral glaucoma reservoir. Cataract resection bilateral Other: These results were communicated to Dr. Pearlean BrownieSethi at 3:47 pmon 8/20/2019by text page via the [facility] messaging system. ASPECTS [facility]([place] Stroke Program Early CT Score) - Ganglionic level infarction (caudate, lentiform nuclei, internal capsule, insula, M1-M3 cortex): 7 - Supraganglionic infarction (M4-M6 cortex): 3 Total score (0-10 with 10 being normal): 10 IMPRESSION: No acute finding.  ASPECTS is 10. Electronically Signed   By: Marnee SpringJonathon  Watts M.D.   On: 09/15/2017 15:48    EKG: Independently reviewed.  Assessment/Plan Principal Problem:   Left-sided weakness Active Problems:    Glaucoma associated with ocular inflammation, severe stage   Hypertension   Diabetes type 2, controlled (HCC)   Cervical spinal cord compression (HCC)     1. L sided weakness - 1. Most concerned for possible cervical cord syndrome at this point, given the CT findings on 08/23/17 which noted "cord impingement likely". 2. MRI C spine ordered to evaluate 3. Discussed with Dr. Amada JupiterKirkpatrick 1. Strongly recommended C-Spine MRI as the most important test 2. Didn't recommend neck brace at this point since we dont think this was traumatic. 4. Seizure type syndrome also possible and EEG has been ordered. Given the findings of possible blood product in ventricle (though even the radiologist seems doubtful about this being acute blood products).  Also now seems less likely to neuro in light of the prior CT findings. 5. In an abundance of caution, will hold the ASA / plavix as well. 6. TIA is also possible, just had  neg work up though and neuro doesn't feel this needs repeating according to Dr. Amada JupiterKirkpatrick (also Otelia LimesLindzen told EDP this, not sure why his note indicates otherwise).  Again given persistent symptoms and neg MRI though, this seems less likely. 2. HTN - continue home BP meds 3. Glaucoma - continue home meds 4. DM2 - continue metformin  DVT prophylaxis: SCDs - given the ? Of blood products in ventricle Code Status: Full Family Communication: Family at bedside Disposition Plan: Home after admit Consults called: Neuro Admission status: Admit to inpatient   Hillary BowGARDNER, Nyasiah Moffet M. DO [facility] Pager [phone] Only works nights!  If 7AM-7PM, please contact the primary day team physician taking care of patient  [email or web address] Password [email or web address]  09/15/2017, 11:25 PM

## 2017-09-15 NOTE — ED Notes (Signed)
ED Provider at bedside. 

## 2017-09-16 ENCOUNTER — Other Ambulatory Visit: Payer: Self-pay | Admitting: Neurosurgery

## 2017-09-16 ENCOUNTER — Observation Stay (HOSPITAL_COMMUNITY): Payer: Medicare HMO

## 2017-09-16 ENCOUNTER — Other Ambulatory Visit: Payer: Self-pay

## 2017-09-16 DIAGNOSIS — R531 Weakness: Secondary | ICD-10-CM | POA: Diagnosis not present

## 2017-09-16 DIAGNOSIS — I10 Essential (primary) hypertension: Secondary | ICD-10-CM | POA: Diagnosis not present

## 2017-09-16 DIAGNOSIS — E119 Type 2 diabetes mellitus without complications: Secondary | ICD-10-CM | POA: Diagnosis not present

## 2017-09-16 LAB — GLUCOSE, CAPILLARY
GLUCOSE-CAPILLARY: 189 mg/dL — AB (ref 70–99)
GLUCOSE-CAPILLARY: 218 mg/dL — AB (ref 70–99)
GLUCOSE-CAPILLARY: 223 mg/dL — AB (ref 70–99)
Glucose-Capillary: 126 mg/dL — ABNORMAL HIGH (ref 70–99)
Glucose-Capillary: 175 mg/dL — ABNORMAL HIGH (ref 70–99)

## 2017-09-16 MED ORDER — DEXAMETHASONE SODIUM PHOSPHATE 4 MG/ML IJ SOLN
4.0000 mg | Freq: Four times a day (QID) | INTRAMUSCULAR | Status: DC
  Administered 2017-09-16 – 2017-09-19 (×15): 4 mg via INTRAVENOUS
  Filled 2017-09-16 (×15): qty 1

## 2017-09-16 MED ORDER — INSULIN ASPART 100 UNIT/ML ~~LOC~~ SOLN
0.0000 [IU] | Freq: Every day | SUBCUTANEOUS | Status: DC
  Administered 2017-09-22: 2 [IU] via SUBCUTANEOUS

## 2017-09-16 MED ORDER — INSULIN ASPART 100 UNIT/ML ~~LOC~~ SOLN
0.0000 [IU] | Freq: Three times a day (TID) | SUBCUTANEOUS | Status: DC
  Administered 2017-09-16 – 2017-09-17 (×2): 3 [IU] via SUBCUTANEOUS
  Administered 2017-09-17 (×2): 2 [IU] via SUBCUTANEOUS
  Administered 2017-09-18: 3 [IU] via SUBCUTANEOUS
  Administered 2017-09-18 – 2017-09-20 (×7): 2 [IU] via SUBCUTANEOUS
  Administered 2017-09-20: 3 [IU] via SUBCUTANEOUS
  Administered 2017-09-21 (×2): 2 [IU] via SUBCUTANEOUS
  Administered 2017-09-22: 3 [IU] via SUBCUTANEOUS
  Administered 2017-09-22: 5 [IU] via SUBCUTANEOUS
  Administered 2017-09-22: 2 [IU] via SUBCUTANEOUS
  Administered 2017-09-23: 3 [IU] via SUBCUTANEOUS
  Administered 2017-09-23: 5 [IU] via SUBCUTANEOUS

## 2017-09-16 NOTE — ED Notes (Signed)
Patient transported to MRI 

## 2017-09-16 NOTE — Progress Notes (Signed)
Rehab Admissions Coordinator Note:  Per PT verbal request, Patient was screened by Nanine MeansKelly Violeta Lecount for appropriateness for an Inpatient Acute Rehab Consult. Because pt is pending surgery Monday for her cervical stenosis, pt is not a CIR candidate at this time. If pt continues to have functional deficits after surgery, please place screen at that time. Please call for questions.   Nanine MeansKelly [place] Fant 09/16/2017, 11:24 AM  I can be reached at [phone].

## 2017-09-16 NOTE — Progress Notes (Signed)
No new stroke seen on MRI. Cervical spinal cord compression seen on MRI of cervical spine. Neurosurgery has been consulted. Neurology will sign off. Please call if there are additional questions.   Electronically signed: Dr. Caryl PinaEric Kieron Kantner

## 2017-09-16 NOTE — Evaluation (Signed)
Physical Therapy Evaluation Patient Details Name: Priscilla Tran MRN: [id number] DOB: 11-Feb-1935 Today's Date: 09/16/2017   History of Present Illness  Pt is a 82 y.o. F with significant PMH of DM, HLD, HTN. Admitted with 36 hours of left upper extremity and left lower extremity weakness. Has severe cervical stenosis secondary to multilevel spondylosis. Critical spinal cord compression at C5-6 and severe stenosis and cord compression at C3-4, C4-5, C6-7.  Clinical Impression  Patient presenting with decreased functional mobility secondary to decreased left lower extremity sensation, proprioception, weakness in UE's and LE's, and imbalance. Requiring moderate assistance to walk in room with walker. Presents as very high risk for falls as patient has no proprioception in left lower extremity and is extremely uncoordinated and unsteady. Discussed with patient family that if she went home before surgery Monday, she would need 24/7 assist and perform transfers only to prevent risk of falling and therefore would need a wheelchair and bedside commode. Patient can perform stand pivot transfers with min guard assist but needs significantly increased assist for any ambulation. Will follow acutely.   Also discussed with Nanine MeansKelly Wolfe, Rehab Admissions Coordinator. She stated patient is not a candidate for CIR prior to receiving surgery.    Follow Up Recommendations SNF;Supervision/Assistance - 24 hour    Equipment Recommendations  Wheelchair (measurements PT);3in1 (PT)    Recommendations for Other Services       Precautions / Restrictions Precautions Precautions: Fall Required Braces or Orthoses: Other Brace/Splint Other Brace/Splint: soft c collar Restrictions Weight Bearing Restrictions: No      Mobility  Bed Mobility Overal bed mobility: Needs Assistance Bed Mobility: Supine to Sit     Supine to sit: Min assist     General bed mobility comments: light min assist to progress up to sitting  edge of bed. Instruction on log roll technique  Transfers Overall transfer level: Needs assistance Equipment used: Rolling walker (2 wheeled) Transfers: Sit to/from [employer] CorporationStand;Stand Pivot Transfers Sit to Stand: Min assist Stand pivot transfers: Min guard       General transfer comment: Min assist to transfers from edge of bed and toilet. Min guard for stand pivot transfer from bed to chair with cues for hand placement  Ambulation/Gait Ambulation/Gait assistance: Mod assist Gait Distance (Feet): 20 Feet Assistive device: Rolling walker (2 wheeled) Gait Pattern/deviations: Step-through pattern;Wide base of support;Staggering left;Staggering right Gait velocity: decr   General Gait Details: Patient with irregular, uncoordinated gait pattern with difficulty with foot placement due to no proprioception in left leg. cues for self pacing. Patient with increase in back pain during ambulation  Stairs            Wheelchair Mobility    Modified Rankin (Stroke Patients Only) Modified Rankin (Stroke Patients Only) Pre-Morbid Rankin Score: Slight disability Modified Rankin: Moderately severe disability     Balance Overall balance assessment: Needs assistance Sitting-balance support: No upper extremity supported;Feet supported Sitting balance-Leahy Scale: Fair     Standing balance support: Bilateral upper extremity supported Standing balance-Leahy Scale: Poor                               Pertinent Vitals/Pain Pain Assessment: Faces Faces Pain Scale: Hurts even more Pain Location: mid thoracic region Pain Descriptors / Indicators: Discomfort;Guarding;Grimacing Pain Intervention(s): Limited activity within patient's tolerance;Monitored during session    Home Living Family/patient expects to be discharged to:: Private residence Living Arrangements: Children Available Help at Discharge: Family;Available 24 hours/day  Type of Home: House Home Access: Level entry      Home Layout: One level Home Equipment: Walker - 2 wheels;Cane - single point      Prior Function Level of Independence: Independent with assistive device(s)         Comments: Uses SPC for outdoor/community ambulatinon     Hand Dominance        Extremity/Trunk Assessment   Upper Extremity Assessment Upper Extremity Assessment: RUE deficits/detail;LUE deficits/detail RUE Deficits / Details: MMT: shoulder flexion 4/5, elbow flexion 4/5, elbow extension 3/5 LUE Deficits / Details: MMT: shoulder flexion 4/5, elbow flexion 4/5, elbow extension 3/5    Lower Extremity Assessment Lower Extremity Assessment: RLE deficits/detail;LLE deficits/detail RLE Deficits / Details: MMT: hip flexion 3/5, knee extension 4/5, ankle dorsiflexion 4/5 RLE Coordination: decreased gross motor LLE Deficits / Details: MMT: hip flexion 3/5, knee extension 4/5, ankle dorsiflexion limited LLE Sensation: decreased proprioception LLE Coordination: decreased gross motor    Cervical / Trunk Assessment Cervical / Trunk Assessment: Kyphotic;Other exceptions Cervical / Trunk Exceptions: cervical collar donned  Communication   Communication: HOH  Cognition Arousal/Alertness: Awake/alert Behavior During Therapy: WFL for tasks assessed/performed Overall Cognitive Status: Within Functional Limits for tasks assessed                                 General Comments: Somewhat decreased insight into deficits      General Comments General comments (skin integrity, edema, etc.): Wears glasses, R eye visually impaired    Exercises     Assessment/Plan    PT Assessment Patient needs continued PT services  PT Problem List Decreased strength;Decreased range of motion;Decreased activity tolerance;Decreased balance;Decreased mobility;Decreased coordination;Pain       PT Treatment Interventions DME instruction;Gait training;Functional mobility training;Therapeutic activities;Therapeutic  exercise;Balance training;Neuromuscular re-education;Patient/family education    PT Goals (Current goals can be found in the Care Plan section)  Acute Rehab PT Goals Patient Stated Goal: pt family states they don't want her to fall before surgery PT Goal Formulation: With patient/family Time For Goal Achievement: 09/30/17 Potential to Achieve Goals: Good    Frequency Min 5X/week   Barriers to discharge Other (comment)(high fall risk)      Co-evaluation               AM-PAC PT "6 Clicks" Daily Activity  Outcome Measure Difficulty turning over in bed (including adjusting bedclothes, sheets and blankets)?: None Difficulty moving from lying on back to sitting on the side of the bed? : A Little Difficulty sitting down on and standing up from a chair with arms (e.g., wheelchair, bedside commode, etc,.)?: Unable Help needed moving to and from a bed to chair (including a wheelchair)?: A Little Help needed walking in hospital room?: A Lot Help needed climbing 3-5 steps with a railing? : Total 6 Click Score: 14    End of Session Equipment Utilized During Treatment: Gait belt Activity Tolerance: Patient tolerated treatment well Patient left: in chair;with call bell/phone within reach;with family/visitor present Nurse Communication: Mobility status PT Visit Diagnosis: Unsteadiness on feet (R26.81);Other abnormalities of gait and mobility (R26.89);Difficulty in walking, not elsewhere classified (R26.2);Pain Pain - part of body: (back)    Time: [id number] PT Time Calculation (min) (ACUTE ONLY): 41 min   Charges:   PT Evaluation $PT Eval Moderate Complexity: 1 Mod PT Treatments $Gait Training: 8-22 mins $Therapeutic Activity: 8-22 mins      Laurina Bustlearoline Sible Straley, PT, DPT Acute Rehabilitation  Services  Pager: [phone]   Vanetta Mulders 09/16/2017, 11:36 AM

## 2017-09-16 NOTE — Care Management Note (Signed)
Case Management Note  Patient Details  Name: Priscilla Tran MRN: [id number] Date of Birth: 1935-03-30  Subjective/Objective:    Admitted with left upper extremity and left lower extremity weakness. PMH of DM, HLD, HTN. Resides with daughter, Andrey CampanileSandy.  [address] Coghill (Daughter) Esmeralda ArthurShelia Chevere (Daughter)    [phone]     PCP:  Action/Plan: Per PT's evaluation/ recommendation:SNF;Supervision/Assistance. CSW managing  disposition to SNF.NCM will continue to follow for needs.   Expected Discharge Date:          Expected Discharge Plan:  Skilled Nursing Facility  In-House Referral:  Clinical Social Work  Discharge planning Services  CM Consult  Post Acute Care Choice:    Choice offered to:     DME Arranged:    DME Agency:     HH Arranged:    HH Agency:     Status of Service:  In process, will continue to follow  If discussed at Long Length of Stay Meetings, dates discussed:    Additional Comments:  Epifanio LeschesCole, Sadako Cegielski Hudson, RN 09/16/2017, 4:30 PM

## 2017-09-16 NOTE — Progress Notes (Signed)
Contacted by Laban EmperorJ. Gardner, MD in regards to plan of care following MRI results. Orders given to place soft c-collar on patient, and remain on bedrest until Neurosurg consult in AM. Collar placed and pt/daughter at bedside aware of ambulation restrictions. Will continue frequent neuro checks and notify MD of any change of symptoms.

## 2017-09-16 NOTE — Progress Notes (Signed)
Progress Note    Priscilla GowdaRita L Catalfamo  ZOX:[id number] DOB: 10/25/1935  DOA: 09/15/2017 PCP: Tracey HarriesBouska, David, MD    Brief Narrative:     Medical records reviewed and are as summarized below:  Priscilla Gowdaita L Shiner is an 82 y.o. 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  Patient was admitted on 7/27-7/29 for TIA work up for L sided weakness that resolved.  It appears during that admit that work up for TIA was negative.  However, of interest it appears that CT C spine was performed later during the admit which showed "likely cord compression".  Patient ultimately was discharged on the 29th as she was asymptomatic.  Plan is for surgery on Monday.  PT recommends 24 hour care which she does not have or SNF.  Social work/care management consult.   Assessment/Plan:   Principal Problem:   Left-sided weakness Active Problems:   Glaucoma associated with ocular inflammation, severe stage   Hypertension   Diabetes type 2, controlled (HCC)   Cervical spinal cord compression (HCC)  Left sided weakness due to severe cervical stenosis -holding ASA/plavix -plan for surgery on Monday -PT: SNF/24 hour supervision--- social work/care management consult  HTN  -resume home meds  Glaucoma  - continue home meds  DM2 -SSI   Family Communication/Anticipated D/C date and plan/Code Status   DVT prophylaxis: scd Code Status: Full Code.  Family Communication: at bedside Disposition Plan:    Medical Consultants:    Neuro  neurosurgery    Subjective:   Does not have 24 hour supervision at home but says she is "not that unsteady"- family at bedside disagrees regarding her steadiness  Objective:    Vitals:   09/16/17 0200 09/16/17 0244 09/16/17 0321 09/16/17 0523  BP: (!) 144/72 (!) 156/79  (!) 149/87  Pulse:  87  71  Resp: 16 16  20   Temp:    97.7 F (36.5 C)  TempSrc:      SpO2:  100%    Weight:   64.3 kg   Height:   5\' 5"  (1.651 m)     Intake/Output  Summary (Last 24 hours) at 09/16/2017 1407 Last data filed at 09/15/2017 2233 Gross per 24 hour  Intake -  Output 1000 ml  Net -1000 ml   Filed Weights   09/16/17 0321  Weight: 64.3 kg    Exam: In chair- neck brace on rrr Clear- no increased work of breathing Mild LUE weakness  Data Reviewed:   I have personally reviewed following labs and imaging studies:  Labs: Labs show the following:   Basic Metabolic Panel: Recent Labs  Lab 09/15/17 1531 09/15/17 1537  NA 138 137  K 3.7 3.6  CL 101 98  CO2 26  --   GLUCOSE 161* 157*  BUN 11 12  CREATININE 0.62 0.50  CALCIUM 10.0  --    GFR Estimated Creatinine Clearance: 51.3 mL/min (by C-G formula based on SCr of 0.5 mg/dL). Liver Function Tests: Recent Labs  Lab 09/15/17 1531  AST 21  ALT 12  ALKPHOS 66  BILITOT 0.8  PROT 7.7  ALBUMIN 4.5   No results for input(s): LIPASE, AMYLASE in the last 168 hours. No results for input(s): AMMONIA in the last 168 hours. Coagulation profile Recent Labs  Lab 09/15/17 1531  INR 0.96    CBC: Recent Labs  Lab 09/15/17 1531 09/15/17 1537  WBC 7.2  --   NEUTROABS 4.9  --   HGB  12.4 13.6  HCT 38.3 40.0  MCV 82.2  --   PLT 228  --    Cardiac Enzymes: No results for input(s): CKTOTAL, CKMB, CKMBINDEX, TROPONINI in the last 168 hours. BNP (last 3 results) No results for input(s): PROBNP in the last 8760 hours. CBG: Recent Labs  Lab 09/16/17 0311 09/16/17 0901 09/16/17 1155  GLUCAP 126* 189* 218*   D-Dimer: No results for input(s): DDIMER in the last 72 hours. Hgb A1c: No results for input(s): HGBA1C in the last 72 hours. Lipid Profile: No results for input(s): CHOL, HDL, LDLCALC, TRIG, CHOLHDL, LDLDIRECT in the last 72 hours. Thyroid function studies: No results for input(s): TSH, T4TOTAL, T3FREE, THYROIDAB in the last 72 hours.  Invalid input(s): FREET3 Anemia work up: No results for input(s): VITAMINB12, FOLATE, FERRITIN, TIBC, IRON, RETICCTPCT in the  last 72 hours. Sepsis Labs: Recent Labs  Lab 09/15/17 1531  WBC 7.2    Microbiology No results found for this or any previous visit (from the past 240 hour(s)).  Procedures and diagnostic studies:  Mr Shirlee Latch NU Contrast  Result Date: 09/15/2017 CLINICAL DATA:  82 y/o  F; left-sided weakness. EXAM: MRI HEAD WITHOUT CONTRAST MRA HEAD WITHOUT CONTRAST TECHNIQUE: Multiplanar, multiecho pulse sequences of the brain and surrounding structures were obtained without intravenous contrast. Angiographic images of the head were obtained using MRA technique without contrast. COMPARISON:  09/15/2017 CT head. FINDINGS: MRI HEAD FINDINGS Brain: No acute infarction, hydrocephalus, extra-axial collection or mass lesion. Several stable nonspecific T2 FLAIR hyperintensities in subcortical and periventricular white matter are compatible with mild chronic microvascular ischemic changes for age. Mild volume loss of the brain. There is sulcal susceptibility hypointensity within the sylvian fissures bilaterally as well as punctate foci within the occipital horns of lateral ventricles and within the fourth ventricle (series 10, image 54, 48, 27). Findings are compatible with cirrhosis. Susceptibility hypointensity within the sylvian fissures is stable from MRI of the brain. Punctate foci within the lateral and fourth ventricle are newly apparent. Vascular: As below. Skull and upper cervical spine: Normal marrow signal. Sinuses/Orbits: Negative. Other: None. MRA HEAD FINDINGS Anterior circulation: Left paraophthalmic artery 2 mm inferiorly directed outpouching (series 551, image 7 and series 5, image 92) possibly representing a tiny aneurysm versus is prominent infundibulum of diminutive vessel. No large vessel occlusion significant stenosis is identified. Posterior circulation: Left P3 superior division origin severe stenosis. No large vessel occlusion, aneurysm, or additional segment of significant stenosis is identified.  Anatomic variant: Bilateral fetal PCA. IMPRESSION: MRI head: 1. Stable mild siderosis within the sylvian fissures. Newly apparent tiny foci of blood products within the occipital horns of lateral ventricles and fourth ventricle are probably due to differences in technique with the current SWAN being more sensitive than the prior SWI. New hemorrhage is less likely. 2. No stroke, mass effect, or herniation. 3. Stable mild chronic microvascular ischemic changes and volume loss of the brain. MRA head: 1. Stable left P3 superior division severe stenosis. 2. No large vessel occlusion or new segment of significant stenosis is identified. 3. 2 mm left paraophthalmic ICA inferiorly directed outpouching may represent a tiny aneurysm or the infundibular origin of a diminutive vessel. Electronically Signed   By: Mitzi Hansen M.D.   On: 09/15/2017 21:42   Mr Brain Wo Contrast  Result Date: 09/15/2017 CLINICAL DATA:  82 y/o  F; left-sided weakness. EXAM: MRI HEAD WITHOUT CONTRAST MRA HEAD WITHOUT CONTRAST TECHNIQUE: Multiplanar, multiecho pulse sequences of the brain and surrounding structures were  obtained without intravenous contrast. Angiographic images of the head were obtained using MRA technique without contrast. COMPARISON:  09/15/2017 CT head. FINDINGS: MRI HEAD FINDINGS Brain: No acute infarction, hydrocephalus, extra-axial collection or mass lesion. Several stable nonspecific T2 FLAIR hyperintensities in subcortical and periventricular white matter are compatible with mild chronic microvascular ischemic changes for age. Mild volume loss of the brain. There is sulcal susceptibility hypointensity within the sylvian fissures bilaterally as well as punctate foci within the occipital horns of lateral ventricles and within the fourth ventricle (series 10, image 54, 48, 27). Findings are compatible with cirrhosis. Susceptibility hypointensity within the sylvian fissures is stable from MRI of the brain. Punctate  foci within the lateral and fourth ventricle are newly apparent. Vascular: As below. Skull and upper cervical spine: Normal marrow signal. Sinuses/Orbits: Negative. Other: None. MRA HEAD FINDINGS Anterior circulation: Left paraophthalmic artery 2 mm inferiorly directed outpouching (series 551, image 7 and series 5, image 92) possibly representing a tiny aneurysm versus is prominent infundibulum of diminutive vessel. No large vessel occlusion significant stenosis is identified. Posterior circulation: Left P3 superior division origin severe stenosis. No large vessel occlusion, aneurysm, or additional segment of significant stenosis is identified. Anatomic variant: Bilateral fetal PCA. IMPRESSION: MRI head: 1. Stable mild siderosis within the sylvian fissures. Newly apparent tiny foci of blood products within the occipital horns of lateral ventricles and fourth ventricle are probably due to differences in technique with the current SWAN being more sensitive than the prior SWI. New hemorrhage is less likely. 2. No stroke, mass effect, or herniation. 3. Stable mild chronic microvascular ischemic changes and volume loss of the brain. MRA head: 1. Stable left P3 superior division severe stenosis. 2. No large vessel occlusion or new segment of significant stenosis is identified. 3. 2 mm left paraophthalmic ICA inferiorly directed outpouching may represent a tiny aneurysm or the infundibular origin of a diminutive vessel. Electronically Signed   By: Mitzi Hansen M.D.   On: 09/15/2017 21:42   Mr Cervical Spine Wo Contrast  Result Date: 09/16/2017 CLINICAL DATA:  LEFT-sided weakness, suspect cord compression. EXAM: MRI CERVICAL SPINE WITHOUT CONTRAST TECHNIQUE: Multiplanar, multisequence MR imaging of the cervical spine was performed. No intravenous contrast was administered. COMPARISON:  CT cervical spine August 23, 2017 FINDINGS: ALIGNMENT: Straightened cervical lordosis. No cervical malalignment. Grade 1 T1-2  anterolisthesis on a degenerative basis. VERTEBRAE/DISCS: Vertebral bodies are intact. Severe C5-6 disc height loss with proportional acute on chronic discogenic endplate changes. Moderate C6-7, mild C3-4 and C4-5 disc height loss with mild-to-moderate chronic discogenic endplate changes. Diffuse disc desiccation. Generalized bright T1 bone marrow signal compatible with osteopenia. Severe LEFT C1-2 lateral mass arthropathy. CORD:Myelomalacia and potential superimposed edema cervical spinal cord from C4-5 through C6-7. No syrinx. POSTERIOR FOSSA, VERTEBRAL ARTERIES, PARASPINAL TISSUES: No MR findings of ligamentous injury. Vertebral artery flow voids present. Included posterior fossa and paraspinal soft tissues are normal. Low signal pannus about the odontoid process seen with CPPD. DISC LEVELS: C2-3: Annular bulging, uncovertebral hypertrophy. Severe LEFT facet arthropathy. No canal stenosis. Mild RIGHT, moderate LEFT neural foraminal narrowing. C3-4: 4 mm broad-based disc bulge, uncovertebral hypertrophy, mild RIGHT and severe LEFT facet arthropathy. Moderate canal stenosis. Severe bilateral neural foraminal narrowing. C4-5: Broad-based disc bulge and RIGHT central disc protrusion total measuring 3 mm in AP dimension. Uncovertebral hypertrophy and moderate RIGHT facet arthropathy. Moderate canal stenosis. Severe bilateral neural foraminal narrowing. C5-6: 5 mm broad-based disc bulge with superimposed undersurface disc protrusion, uncovertebral hypertrophy and mild facet arthropathy. Severe  canal stenosis, AP dimension of the spinal canal is 4 mm. Severe bilateral neural foraminal narrowing. C6-7: 4 mm broad-based disc bulge. Uncovertebral hypertrophy. Moderate canal stenosis. Moderate to severe neural foraminal narrowing. C7-T1: No disc bulge. Moderate RIGHT greater LEFT facet arthropathy without canal stenosis. Mild RIGHT and moderate LEFT neural foraminal narrowing. IMPRESSION: 1. Degenerative change of the  cervical spine without fracture or malalignment. 2. C4-5 through C6-7 myelomalacia and possible cord edema. 3. Severe canal stenosis C5-6 with cord compression. Moderate canal stenosis C3-4, C4-5 and C6-7. 4. Neural foraminal narrowing all cervical levels: Severe from C3-4 to C5-6. Electronically Signed   By: Awilda Metroourtnay  Bloomer M.D.   On: 09/16/2017 00:53   Ct Head Code Stroke Wo Contrast  Result Date: 09/15/2017 CLINICAL DATA:  Code stroke.  Left-sided weakness with unsteady gait EXAM: CT HEAD WITHOUT CONTRAST TECHNIQUE: Contiguous axial images were obtained from the base of the skull through the vertex without intravenous contrast. COMPARISON:  08/23/2017 FINDINGS: Brain: No evidence of acute infarction, hemorrhage, hydrocephalus, extra-axial collection or mass lesion/mass effect. Generalized atrophy and periventricular chronic small vessel ischemia that is mild for age. Vascular: No hyperdense vessel.  Atherosclerotic calcification. Skull: No acute finding.  Left C1-2 severe facet arthropathy. Sinuses/Orbits: Bilateral glaucoma reservoir. Cataract resection bilateral Other: These results were communicated to Dr. Pearlean BrownieSethi at 3:47 pmon 8/20/2019by text page via the [facility] messaging system. ASPECTS [facility]([place] Stroke Program Early CT Score) - Ganglionic level infarction (caudate, lentiform nuclei, internal capsule, insula, M1-M3 cortex): 7 - Supraganglionic infarction (M4-M6 cortex): 3 Total score (0-10 with 10 being normal): 10 IMPRESSION: No acute finding.  ASPECTS is 10. Electronically Signed   By: Marnee SpringJonathon  Watts M.D.   On: 09/15/2017 15:48    Medications:   . amLODipine  10 mg Oral Daily  . carvedilol  12.5 mg Oral Q breakfast  . dexamethasone  4 mg Intravenous Q6H  . dorzolamide-timolol  1 drop Left Eye BID  . [START ON 09/17/2017] hydrochlorothiazide  25 mg Oral QODAY  . levothyroxine  150 mcg Oral Q breakfast  . metFORMIN  1,000 mg Oral BID WC  . pravastatin  20 mg Oral q1800  . prednisoLONE acetate   1 drop Left Eye BID   Continuous Infusions:   LOS: 0 days   Joseph ArtJessica U Tosca Pletz  [facility]   *Please refer to [email or web address], password [email or web address] to get updated schedule on who will round on this patient, as hospitalists switch teams weekly. If 7PM-7AM, please contact night-coverage at [email or web address], password [email or web address] for any overnight needs.  09/16/2017, 2:07 PM

## 2017-09-16 NOTE — H&P (View-Only) (Signed)
Reason for Consult: Spinal stenosis Referring Physician: Medicine  Priscilla Tran is an 82 y.o. female.  HPI: 82 year old female with left upper extremity and left lower extremity weakness for approximately 36 hours.  Patient without history of recent trauma or injury.  Patient with fairly recent history of TIA and has started treatment with aspirin and Plavix for this.  Patient notes weakness in her left upper extremity and weakness and sensory loss in her left lower extremity and to a lesser degree her right lower extremity.  Patient with difficulty walking.  No bowel or bladder dysfunction.  Past Medical History:  Diagnosis Date  . Diabetes mellitus   . Disc degeneration, lumbar    Dr. Mina Marble  . Glaucoma associated with ocular inflammation, severe stage    blind Right eye  . Hyperlipidemia   . Hypertension   . Hypothyroid   . SBO (small bowel obstruction) s/p lap LOA [id number] 03/31/2011    Past Surgical History:  Procedure Laterality Date  . EYE SURGERY  2008   glaucoma right eye. Per patient she has had multiple surgeries on right eye, resulting in scar tissue.  Marland Kitchen LAPAROSCOPY  04/04/2011   Procedure: LAPAROSCOPY DIAGNOSTIC;  Surgeon: Adin Hector, MD;  Location: [facility];  Service: General;  Laterality: N/A;  . NM MYOCAR PERF WALL MOTION  06/20/2010   Normal  . US ECHOCARDIOGRAPHY  05/06/2005   Mild MR, PI    Family History  Problem Relation Age of Onset  . Heart disease Mother   . Heart attack Mother   . Heart disease Father   . Heart attack Father   . Cancer Brother        kidney    Social History:  reports that she has never smoked. She has never used smokeless tobacco. She reports that she drinks alcohol. She reports that she does not use drugs.  Allergies:  Allergies  Allergen Reactions  . Lisinopril Swelling    Face and throat. Breathing not affected per patient.    Medications: I have reviewed the patient's current medications.  Results for orders placed or  performed during the hospital encounter of 09/15/17 (from the past 48 hour(s))  Protime-INR     Status: None   Collection Time: 09/15/17  3:31 PM  Result Value Ref Range   Prothrombin Time 12.7 11.4 - 15.2 seconds   INR 0.96     Comment: Performed at [facility], [place] [address] [place], [place] [address]  APTT     Status: None   Collection Time: 09/15/17  3:31 PM  Result Value Ref Range   aPTT 28 24 - 36 seconds    Comment: Performed at [facility] [address] [place], [place] [address]  CBC     Status: None   Collection Time: 09/15/17  3:31 PM  Result Value Ref Range   WBC 7.2 4.0 - 10.5 K/uL   RBC 4.66 3.87 - 5.11 MIL/uL   Hemoglobin 12.4 12.0 - 15.0 g/dL   HCT 38.3 36.0 - 46.0 %   MCV 82.2 78.0 - 100.0 fL   MCH 26.6 26.0 - 34.0 pg   MCHC 32.4 30.0 - 36.0 g/dL   RDW 14.6 11.5 - 15.5 %   Platelets 228 150 - 400 K/uL    Comment: Performed at [facility], [place] [address] [place],  [address]  Differential     Status: None   Collection Time: 09/15/17  3:31 PM  Result Value Ref Range   Neutrophils Relative % 67 %   Neutro Abs 4.9 1.7 - 7.7 K/uL   Lymphocytes Relative 20 %   Lymphs Abs 1.4 0.7 - 4.0 K/uL   Monocytes Relative 9 %   Monocytes Absolute 0.7 0.1 - 1.0 K/uL   Eosinophils Relative 3 %   Eosinophils Absolute 0.2 0.0 - 0.7 K/uL   Basophils Relative 1 %   Basophils Absolute 0.1 0.0 - 0.1 K/uL   Immature Granulocytes 0 %   Abs Immature Granulocytes 0.0 0.0 - 0.1 K/uL    Comment: Performed at [facility] [address] [place], [place] [address]  Comprehensive metabolic panel     Status: Abnormal   Collection Time: 09/15/17  3:31 PM  Result Value Ref Range   Sodium 138 135 - 145 mmol/L   Potassium 3.7 3.5 - 5.1 mmol/L   Chloride 101 98 - 111 mmol/L   CO2 26 22 - 32 mmol/L   Glucose, Bld 161 (H) 70 - 99 mg/dL   BUN 11 8 - 23 mg/dL   Creatinine, Ser 0.62 0.44 - 1.00 mg/dL   Calcium 10.0 8.9 - 10.3 mg/dL   Total Protein 7.7  6.5 - 8.1 g/dL   Albumin 4.5 3.5 - 5.0 g/dL   AST 21 15 - 41 U/L   ALT 12 0 - 44 U/L   Alkaline Phosphatase 66 38 - 126 U/L   Total Bilirubin 0.8 0.3 - 1.2 mg/dL   GFR calc non Af Amer >60 >60 mL/min   GFR calc Af Amer >60 >60 mL/min    Comment: (NOTE) The eGFR has been calculated using the CKD EPI equation. This calculation has not been validated in all clinical situations. eGFR's persistently <60 mL/min signify possible Chronic Kidney Disease.    Anion gap 11 5 - 15    Comment: Performed at [facility] [address] [place], [place] [address]  I-stat troponin, ED     Status: None   Collection Time: 09/15/17  3:36 PM  Result Value Ref Range   Troponin i, poc 0.00 0.00 - 0.08 ng/mL   Comment 3            Comment: Due to the release kinetics of cTnI, a negative result within the first hours of the onset of symptoms does not rule out myocardial infarction with certainty. If myocardial infarction is still suspected, repeat the test at appropriate intervals.   I-Stat Chem 8, ED     Status: Abnormal   Collection Time: 09/15/17  3:37 PM  Result Value Ref Range   Sodium 137 135 - 145 mmol/L   Potassium 3.6 3.5 - 5.1 mmol/L   Chloride 98 98 - 111 mmol/L   BUN 12 8 - 23 mg/dL   Creatinine, Ser 0.50 0.44 - 1.00 mg/dL   Glucose, Bld 157 (H) 70 - 99 mg/dL   Calcium, Ion 1.11 (L) 1.15 - 1.40 mmol/L   TCO2 25 22 - 32 mmol/L   Hemoglobin 13.6 12.0 - 15.0 g/dL   HCT 40.0 36.0 - 46.0 %  Urinalysis, Routine w reflex microscopic     Status: Abnormal   Collection Time: 09/15/17  6:49 PM  Result Value Ref Range   Color, Urine COLORLESS (A) YELLOW   APPearance CLEAR CLEAR   Specific Gravity, Urine 1.003 (L) 1.005 - 1.030   pH 7.0 5.0 - 8.0   Glucose, UA NEGATIVE NEGATIVE mg/dL   Hgb urine dipstick NEGATIVE NEGATIVE  Bilirubin Urine NEGATIVE NEGATIVE   Ketones, ur NEGATIVE NEGATIVE mg/dL   Protein, ur NEGATIVE NEGATIVE mg/dL   Nitrite NEGATIVE NEGATIVE   Leukocytes, UA  NEGATIVE NEGATIVE    Comment: Performed at [facility] [address] [place], [place] [address]  Glucose, capillary     Status: Abnormal   Collection Time: 09/16/17  3:11 AM  Result Value Ref Range   Glucose-Capillary 126 (H) 70 - 99 mg/dL    Mr Virgel Paling TM Contrast  Result Date: 09/15/2017 CLINICAL DATA:  82 y/o  F; left-sided weakness. EXAM: MRI HEAD WITHOUT CONTRAST MRA HEAD WITHOUT CONTRAST TECHNIQUE: Multiplanar, multiecho pulse sequences of the brain and surrounding structures were obtained without intravenous contrast. Angiographic images of the head were obtained using MRA technique without contrast. COMPARISON:  09/15/2017 CT head. FINDINGS: MRI HEAD FINDINGS Brain: No acute infarction, hydrocephalus, extra-axial collection or mass lesion. Several stable nonspecific T2 FLAIR hyperintensities in subcortical and periventricular white matter are compatible with mild chronic microvascular ischemic changes for age. Mild volume loss of the brain. There is sulcal susceptibility hypointensity within the sylvian fissures bilaterally as well as punctate foci within the occipital horns of lateral ventricles and within the fourth ventricle (series 10, image 54, 48, 27). Findings are compatible with cirrhosis. Susceptibility hypointensity within the sylvian fissures is stable from MRI of the brain. Punctate foci within the lateral and fourth ventricle are newly apparent. Vascular: As below. Skull and upper cervical spine: Normal marrow signal. Sinuses/Orbits: Negative. Other: None. MRA HEAD FINDINGS Anterior circulation: Left paraophthalmic artery 2 mm inferiorly directed outpouching (series 551, image 7 and series 5, image 92) possibly representing a tiny aneurysm versus is prominent infundibulum of diminutive vessel. No large vessel occlusion significant stenosis is identified. Posterior circulation: Left P3 superior division origin severe stenosis. No large vessel occlusion, aneurysm, or  additional segment of significant stenosis is identified. Anatomic variant: Bilateral fetal PCA. IMPRESSION: MRI head: 1. Stable mild siderosis within the sylvian fissures. Newly apparent tiny foci of blood products within the occipital horns of lateral ventricles and fourth ventricle are probably due to differences in technique with the current SWAN being more sensitive than the prior SWI. New hemorrhage is less likely. 2. No stroke, mass effect, or herniation. 3. Stable mild chronic microvascular ischemic changes and volume loss of the brain. MRA head: 1. Stable left P3 superior division severe stenosis. 2. No large vessel occlusion or new segment of significant stenosis is identified. 3. 2 mm left paraophthalmic ICA inferiorly directed outpouching may represent a tiny aneurysm or the infundibular origin of a diminutive vessel. Electronically Signed   By: Kristine Garbe M.D.   On: 09/15/2017 21:42   Mr Brain Wo Contrast  Result Date: 09/15/2017 CLINICAL DATA:  82 y/o  F; left-sided weakness. EXAM: MRI HEAD WITHOUT CONTRAST MRA HEAD WITHOUT CONTRAST TECHNIQUE: Multiplanar, multiecho pulse sequences of the brain and surrounding structures were obtained without intravenous contrast. Angiographic images of the head were obtained using MRA technique without contrast. COMPARISON:  09/15/2017 CT head. FINDINGS: MRI HEAD FINDINGS Brain: No acute infarction, hydrocephalus, extra-axial collection or mass lesion. Several stable nonspecific T2 FLAIR hyperintensities in subcortical and periventricular white matter are compatible with mild chronic microvascular ischemic changes for age. Mild volume loss of the brain. There is sulcal susceptibility hypointensity within the sylvian fissures bilaterally as well as punctate foci within the occipital horns of lateral ventricles and within the fourth ventricle (series 10, image 54, 48, 27). Findings are compatible with  cirrhosis. Susceptibility hypointensity within the  sylvian fissures is stable from MRI of the brain. Punctate foci within the lateral and fourth ventricle are newly apparent. Vascular: As below. Skull and upper cervical spine: Normal marrow signal. Sinuses/Orbits: Negative. Other: None. MRA HEAD FINDINGS Anterior circulation: Left paraophthalmic artery 2 mm inferiorly directed outpouching (series 551, image 7 and series 5, image 92) possibly representing a tiny aneurysm versus is prominent infundibulum of diminutive vessel. No large vessel occlusion significant stenosis is identified. Posterior circulation: Left P3 superior division origin severe stenosis. No large vessel occlusion, aneurysm, or additional segment of significant stenosis is identified. Anatomic variant: Bilateral fetal PCA. IMPRESSION: MRI head: 1. Stable mild siderosis within the sylvian fissures. Newly apparent tiny foci of blood products within the occipital horns of lateral ventricles and fourth ventricle are probably due to differences in technique with the current SWAN being more sensitive than the prior SWI. New hemorrhage is less likely. 2. No stroke, mass effect, or herniation. 3. Stable mild chronic microvascular ischemic changes and volume loss of the brain. MRA head: 1. Stable left P3 superior division severe stenosis. 2. No large vessel occlusion or new segment of significant stenosis is identified. 3. 2 mm left paraophthalmic ICA inferiorly directed outpouching may represent a tiny aneurysm or the infundibular origin of a diminutive vessel. Electronically Signed   By: Kristine Garbe M.D.   On: 09/15/2017 21:42   Mr Cervical Spine Wo Contrast  Result Date: 09/16/2017 CLINICAL DATA:  LEFT-sided weakness, suspect cord compression. EXAM: MRI CERVICAL SPINE WITHOUT CONTRAST TECHNIQUE: Multiplanar, multisequence MR imaging of the cervical spine was performed. No intravenous contrast was administered. COMPARISON:  CT cervical spine August 23, 2017 FINDINGS: ALIGNMENT: Straightened  cervical lordosis. No cervical malalignment. Grade 1 T1-2 anterolisthesis on a degenerative basis. VERTEBRAE/DISCS: Vertebral bodies are intact. Severe C5-6 disc height loss with proportional acute on chronic discogenic endplate changes. Moderate C6-7, mild C3-4 and C4-5 disc height loss with mild-to-moderate chronic discogenic endplate changes. Diffuse disc desiccation. Generalized bright T1 bone marrow signal compatible with osteopenia. Severe LEFT C1-2 lateral mass arthropathy. CORD:Myelomalacia and potential superimposed edema cervical spinal cord from C4-5 through C6-7. No syrinx. POSTERIOR FOSSA, VERTEBRAL ARTERIES, PARASPINAL TISSUES: No MR findings of ligamentous injury. Vertebral artery flow voids present. Included posterior fossa and paraspinal soft tissues are normal. Low signal pannus about the odontoid process seen with CPPD. DISC LEVELS: C2-3: Annular bulging, uncovertebral hypertrophy. Severe LEFT facet arthropathy. No canal stenosis. Mild RIGHT, moderate LEFT neural foraminal narrowing. C3-4: 4 mm broad-based disc bulge, uncovertebral hypertrophy, mild RIGHT and severe LEFT facet arthropathy. Moderate canal stenosis. Severe bilateral neural foraminal narrowing. C4-5: Broad-based disc bulge and RIGHT central disc protrusion total measuring 3 mm in AP dimension. Uncovertebral hypertrophy and moderate RIGHT facet arthropathy. Moderate canal stenosis. Severe bilateral neural foraminal narrowing. C5-6: 5 mm broad-based disc bulge with superimposed undersurface disc protrusion, uncovertebral hypertrophy and mild facet arthropathy. Severe canal stenosis, AP dimension of the spinal canal is 4 mm. Severe bilateral neural foraminal narrowing. C6-7: 4 mm broad-based disc bulge. Uncovertebral hypertrophy. Moderate canal stenosis. Moderate to severe neural foraminal narrowing. C7-T1: No disc bulge. Moderate RIGHT greater LEFT facet arthropathy without canal stenosis. Mild RIGHT and moderate LEFT neural foraminal  narrowing. IMPRESSION: 1. Degenerative change of the cervical spine without fracture or malalignment. 2. C4-5 through C6-7 myelomalacia and possible cord edema. 3. Severe canal stenosis C5-6 with cord compression. Moderate canal stenosis C3-4, C4-5 and C6-7. 4. Neural foraminal narrowing all cervical levels: Severe from  C3-4 to C5-6. Electronically Signed   By: Elon Alas M.D.   On: 09/16/2017 00:53   Ct Head Code Stroke Wo Contrast  Result Date: 09/15/2017 CLINICAL DATA:  Code stroke.  Left-sided weakness with unsteady gait EXAM: CT HEAD WITHOUT CONTRAST TECHNIQUE: Contiguous axial images were obtained from the base of the skull through the vertex without intravenous contrast. COMPARISON:  08/23/2017 FINDINGS: Brain: No evidence of acute infarction, hemorrhage, hydrocephalus, extra-axial collection or mass lesion/mass effect. Generalized atrophy and periventricular chronic small vessel ischemia that is mild for age. Vascular: No hyperdense vessel.  Atherosclerotic calcification. Skull: No acute finding.  Left C1-2 severe facet arthropathy. Sinuses/Orbits: Bilateral glaucoma reservoir. Cataract resection bilateral Other: These results were communicated to Dr. Leonie Man at 3:47 pmon 8/20/2019by text page via the [facility] messaging system. ASPECTS [facility] Stroke Program Early CT Score) - Ganglionic level infarction (caudate, lentiform nuclei, internal capsule, insula, M1-M3 cortex): 7 - Supraganglionic infarction (M4-M6 cortex): 3 Total score (0-10 with 10 being normal): 10 IMPRESSION: No acute finding.  ASPECTS is 10. Electronically Signed   By: Monte Fantasia M.D.   On: 09/15/2017 15:48    Pertinent items noted in HPI and remainder of comprehensive ROS otherwise negative. Blood pressure (!) 149/87, pulse 71, temperature 97.7 F (36.5 C), resp. rate 20, height 5' 5" (1.651 m), weight 64.3 kg, SpO2 100 %. Patient is awake and alert.  She is oriented and appropriate.  Her speech is fluent.  Her judgment  insight are intact.  Her cranial nerve function is normal bilaterally.  Motor examination reveals weakness of her left triceps muscle group grading out at 3/5.  She has weakness of her left wrist extensors grading out at 4/5.  She has weakness of her grips bilaterally grading out at 4/5.  She has intrinsic weakness bilaterally grading out at 4/5 on the right and 4-/5 on the left.  She has weakness and spasticity of her left lower extremity with 4/5 strength.  She has reasonably normal strength in her right lower extremity.  Sensory examination with decreased sensation in both distal upper extremities left greater than right.  She has decreased sensation in both lower extremities.  Reflexes are normal at both biceps.  They are absent in her left and right triceps.  They are increased with some clonus in her left patella.  Her toes are upgoing to plantar stimulation.  Examination head ears eyes and throat is unremarkable.  Chest and abdomen are benign.  Extremities are free from injury deformity.  Assessment/Plan: Patient with severe cervical stenosis secondary to multilevel spondylosis.  Patient with critical spinal cord compression at C5-6 but also with severe stenosis and cord compression at C3-4, C4-5 and C6-7.  I discussed options available for treatment including both operative and nonoperative care.  I recommended that we move forward with a C3-4, C4-5, C5-6 and C6-7 anterior cervical discectomy with interbody fusion utilizing interbody peek cages, locally harvested autograft, and augmented with anterior plate instrumentation.  I discussed the risks involved with surgery including but not limited to the risk of anesthesia, bleeding, infection, CSF leak, nerve root injury, spinal cord injury, fusion failure, his mentation failure, dysphagia, dysphonia, continued pain, and non-benefit.  The patient has been given the opportunity has questions as has her daughter.  They wish to proceed with surgery.  Because of  the patient's aspirin and Plavix use the earliest possible time to do the surgery will be Monday afternoon.  If patient is stable and safe with mobilization then she  can be discharged home.  Priscilla Tran 09/16/2017, 8:47 AM

## 2017-09-16 NOTE — Care Management Obs Status (Signed)
MEDICARE OBSERVATION STATUS NOTIFICATION   Patient Details  Name: Asencion GowdaRita L Geigle MRN: [id number] Date of Birth: Sep 13, 1935   Medicare Observation Status Notification Given:  Yes    Epifanio LeschesCole, Endre Coutts Hudson, RN 09/16/2017, 4:29 PM

## 2017-09-16 NOTE — Progress Notes (Addendum)
MRI results reviewed with Dr. Amada JupiterKirkpatrick: 1) Canceling EEG 2) Decadron 4mg  Q6H IV ordered 3) call neurosurgery in AM for formal consult.  Sooner if neuro status were to worsen, but by HPI have been stable / slightly improving since 1300 (patient again at this time states that symptoms are "improving").  Thus I doubt that neurosurgery would take this patient, who still has ASA and plavix on board, emergently to the OR in the next 2-3 hours. 4) will put on soft collar for now 5) bed rest till NS can see her  Addendum: Spoke with Dr. Jordan LikesPool who will see patient in consult this morning.

## 2017-09-16 NOTE — Consult Note (Signed)
Reason for Consult: Spinal stenosis Referring Physician: Medicine  Priscilla Tran is an 81 y.o. female.  HPI: 81-year-old female with left upper extremity and left lower extremity weakness for approximately 36 hours.  Patient without history of recent trauma or injury.  Patient with fairly recent history of TIA and has started treatment with aspirin and Plavix for this.  Patient notes weakness in her left upper extremity and weakness and sensory loss in her left lower extremity and to a lesser degree her right lower extremity.  Patient with difficulty walking.  No bowel or bladder dysfunction.  Past Medical History:  Diagnosis Date  . Diabetes mellitus   . Disc degeneration, lumbar    Dr. Wang  . Glaucoma associated with ocular inflammation, severe stage    blind Right eye  . Hyperlipidemia   . Hypertension   . Hypothyroid   . SBO (small bowel obstruction) s/p lap LOA Mar2013 03/31/2011    Past Surgical History:  Procedure Laterality Date  . EYE SURGERY  2008   glaucoma right eye. Per patient she has had multiple surgeries on right eye, resulting in scar tissue.  . LAPAROSCOPY  04/04/2011   Procedure: LAPAROSCOPY DIAGNOSTIC;  Surgeon: Steven C. Gross, MD;  Location: [facility];  Service: General;  Laterality: N/A;  . NM MYOCAR PERF WALL MOTION  06/20/2010   Normal  . US ECHOCARDIOGRAPHY  05/06/2005   Mild MR, PI    Family History  Problem Relation Age of Onset  . Heart disease Mother   . Heart attack Mother   . Heart disease Father   . Heart attack Father   . Cancer Brother        kidney    Social History:  reports that she has never smoked. She has never used smokeless tobacco. She reports that she drinks alcohol. She reports that she does not use drugs.  Allergies:  Allergies  Allergen Reactions  . Lisinopril Swelling    Face and throat. Breathing not affected per patient.    Medications: I have reviewed the patient's current medications.  Results for orders placed or  performed during the hospital encounter of 09/15/17 (from the past 48 hour(s))  Protime-INR     Status: None   Collection Time: 09/15/17  3:31 PM  Result Value Ref Range   Prothrombin Time 12.7 11.4 - 15.2 seconds   INR 0.96     Comment: Performed at [facility], [address] [place], [place] [address]  APTT     Status: None   Collection Time: 09/15/17  3:31 PM  Result Value Ref Range   aPTT 28 24 - 36 seconds    Comment: Performed at [facility], [address] [place], [place] [address]  CBC     Status: None   Collection Time: 09/15/17  3:31 PM  Result Value Ref Range   WBC 7.2 4.0 - 10.5 K/uL   RBC 4.66 3.87 - 5.11 MIL/uL   Hemoglobin 12.4 12.0 - 15.0 g/dL   HCT 38.3 36.0 - 46.0 %   MCV 82.2 78.0 - 100.0 fL   MCH 26.6 26.0 - 34.0 pg   MCHC 32.4 30.0 - 36.0 g/dL   RDW 14.6 11.5 - 15.5 %   Platelets 228 150 - 400 K/uL    Comment: Performed at  [facility], [address] [place], [place] [address]  Differential     Status: None   Collection Time: 09/15/17  3:31 PM    Result Value Ref Range   Neutrophils Relative % 67 %   Neutro Abs 4.9 1.7 - 7.7 K/uL   Lymphocytes Relative 20 %   Lymphs Abs 1.4 0.7 - 4.0 K/uL   Monocytes Relative 9 %   Monocytes Absolute 0.7 0.1 - 1.0 K/uL   Eosinophils Relative 3 %   Eosinophils Absolute 0.2 0.0 - 0.7 K/uL   Basophils Relative 1 %   Basophils Absolute 0.1 0.0 - 0.1 K/uL   Immature Granulocytes 0 %   Abs Immature Granulocytes 0.0 0.0 - 0.1 K/uL    Comment: Performed at [facility], [address] [place], [place] [address]  Comprehensive metabolic panel     Status: Abnormal   Collection Time: 09/15/17  3:31 PM  Result Value Ref Range   Sodium 138 135 - 145 mmol/L   Potassium 3.7 3.5 - 5.1 mmol/L   Chloride 101 98 - 111 mmol/L   CO2 26 22 - 32 mmol/L   Glucose, Bld 161 (H) 70 - 99 mg/dL   BUN 11 8 - 23 mg/dL   Creatinine, Ser 0.62 0.44 - 1.00 mg/dL   Calcium 10.0 8.9 - 10.3 mg/dL   Total Protein 7.7  6.5 - 8.1 g/dL   Albumin 4.5 3.5 - 5.0 g/dL   AST 21 15 - 41 U/L   ALT 12 0 - 44 U/L   Alkaline Phosphatase 66 38 - 126 U/L   Total Bilirubin 0.8 0.3 - 1.2 mg/dL   GFR calc non Af Amer >60 >60 mL/min   GFR calc Af Amer >60 >60 mL/min    Comment: (NOTE) The eGFR has been calculated using the CKD EPI equation. This calculation has not been validated in all clinical situations. eGFR's persistently <60 mL/min signify possible Chronic Kidney Disease.    Anion gap 11 5 - 15    Comment: Performed at [facility], [address] [place], [place] [address]  I-stat troponin, ED     Status: None   Collection Time: 09/15/17  3:36 PM  Result Value Ref Range   Troponin i, poc 0.00 0.00 - 0.08 ng/mL   Comment 3            Comment: Due to the release kinetics of cTnI, a negative result within the first hours of the onset of symptoms does not rule out myocardial infarction with certainty. If myocardial infarction is still suspected, repeat the test at appropriate intervals.   I-Stat Chem 8, ED     Status: Abnormal   Collection Time: 09/15/17  3:37 PM  Result Value Ref Range   Sodium 137 135 - 145 mmol/L   Potassium 3.6 3.5 - 5.1 mmol/L   Chloride 98 98 - 111 mmol/L   BUN 12 8 - 23 mg/dL   Creatinine, Ser 0.50 0.44 - 1.00 mg/dL   Glucose, Bld 157 (H) 70 - 99 mg/dL   Calcium, Ion 1.11 (L) 1.15 - 1.40 mmol/L   TCO2 25 22 - 32 mmol/L   Hemoglobin 13.6 12.0 - 15.0 g/dL   HCT 40.0 36.0 - 46.0 %  Urinalysis, Routine w reflex microscopic     Status: Abnormal   Collection Time: 09/15/17  6:49 PM  Result Value Ref Range   Color, Urine COLORLESS (A) YELLOW   APPearance CLEAR CLEAR   Specific Gravity, Urine 1.003 (L) 1.005 - 1.030   pH 7.0 5.0 - 8.0   Glucose, UA NEGATIVE NEGATIVE mg/dL   Hgb urine dipstick NEGATIVE NEGATIVE     Bilirubin Urine NEGATIVE NEGATIVE   Ketones, ur NEGATIVE NEGATIVE mg/dL   Protein, ur NEGATIVE NEGATIVE mg/dL   Nitrite NEGATIVE NEGATIVE   Leukocytes, UA  NEGATIVE NEGATIVE    Comment: Performed at [facility], [address] [place], [place] [address]  Glucose, capillary     Status: Abnormal   Collection Time: 09/16/17  3:11 AM  Result Value Ref Range   Glucose-Capillary 126 (H) 70 - 99 mg/dL    Mr Mra Head Wo Contrast  Result Date: 09/15/2017 CLINICAL DATA:  81 y/o  F; left-sided weakness. EXAM: MRI HEAD WITHOUT CONTRAST MRA HEAD WITHOUT CONTRAST TECHNIQUE: Multiplanar, multiecho pulse sequences of the brain and surrounding structures were obtained without intravenous contrast. Angiographic images of the head were obtained using MRA technique without contrast. COMPARISON:  09/15/2017 CT head. FINDINGS: MRI HEAD FINDINGS Brain: No acute infarction, hydrocephalus, extra-axial collection or mass lesion. Several stable nonspecific T2 FLAIR hyperintensities in subcortical and periventricular white matter are compatible with mild chronic microvascular ischemic changes for age. Mild volume loss of the brain. There is sulcal susceptibility hypointensity within the sylvian fissures bilaterally as well as punctate foci within the occipital horns of lateral ventricles and within the fourth ventricle (series 10, image 54, 48, 27). Findings are compatible with cirrhosis. Susceptibility hypointensity within the sylvian fissures is stable from MRI of the brain. Punctate foci within the lateral and fourth ventricle are newly apparent. Vascular: As below. Skull and upper cervical spine: Normal marrow signal. Sinuses/Orbits: Negative. Other: None. MRA HEAD FINDINGS Anterior circulation: Left paraophthalmic artery 2 mm inferiorly directed outpouching (series 551, image 7 and series 5, image 92) possibly representing a tiny aneurysm versus is prominent infundibulum of diminutive vessel. No large vessel occlusion significant stenosis is identified. Posterior circulation: Left P3 superior division origin severe stenosis. No large vessel occlusion, aneurysm, or  additional segment of significant stenosis is identified. Anatomic variant: Bilateral fetal PCA. IMPRESSION: MRI head: 1. Stable mild siderosis within the sylvian fissures. Newly apparent tiny foci of blood products within the occipital horns of lateral ventricles and fourth ventricle are probably due to differences in technique with the current SWAN being more sensitive than the prior SWI. New hemorrhage is less likely. 2. No stroke, mass effect, or herniation. 3. Stable mild chronic microvascular ischemic changes and volume loss of the brain. MRA head: 1. Stable left P3 superior division severe stenosis. 2. No large vessel occlusion or new segment of significant stenosis is identified. 3. 2 mm left paraophthalmic ICA inferiorly directed outpouching may represent a tiny aneurysm or the infundibular origin of a diminutive vessel. Electronically Signed   By: Lance  Furusawa-Stratton M.D.   On: 09/15/2017 21:42   Mr Brain Wo Contrast  Result Date: 09/15/2017 CLINICAL DATA:  81 y/o  F; left-sided weakness. EXAM: MRI HEAD WITHOUT CONTRAST MRA HEAD WITHOUT CONTRAST TECHNIQUE: Multiplanar, multiecho pulse sequences of the brain and surrounding structures were obtained without intravenous contrast. Angiographic images of the head were obtained using MRA technique without contrast. COMPARISON:  09/15/2017 CT head. FINDINGS: MRI HEAD FINDINGS Brain: No acute infarction, hydrocephalus, extra-axial collection or mass lesion. Several stable nonspecific T2 FLAIR hyperintensities in subcortical and periventricular white matter are compatible with mild chronic microvascular ischemic changes for age. Mild volume loss of the brain. There is sulcal susceptibility hypointensity within the sylvian fissures bilaterally as well as punctate foci within the occipital horns of lateral ventricles and within the fourth ventricle (series 10, image 54, 48, 27). Findings are compatible with   cirrhosis. Susceptibility hypointensity within the  sylvian fissures is stable from MRI of the brain. Punctate foci within the lateral and fourth ventricle are newly apparent. Vascular: As below. Skull and upper cervical spine: Normal marrow signal. Sinuses/Orbits: Negative. Other: None. MRA HEAD FINDINGS Anterior circulation: Left paraophthalmic artery 2 mm inferiorly directed outpouching (series 551, image 7 and series 5, image 92) possibly representing a tiny aneurysm versus is prominent infundibulum of diminutive vessel. No large vessel occlusion significant stenosis is identified. Posterior circulation: Left P3 superior division origin severe stenosis. No large vessel occlusion, aneurysm, or additional segment of significant stenosis is identified. Anatomic variant: Bilateral fetal PCA. IMPRESSION: MRI head: 1. Stable mild siderosis within the sylvian fissures. Newly apparent tiny foci of blood products within the occipital horns of lateral ventricles and fourth ventricle are probably due to differences in technique with the current SWAN being more sensitive than the prior SWI. New hemorrhage is less likely. 2. No stroke, mass effect, or herniation. 3. Stable mild chronic microvascular ischemic changes and volume loss of the brain. MRA head: 1. Stable left P3 superior division severe stenosis. 2. No large vessel occlusion or new segment of significant stenosis is identified. 3. 2 mm left paraophthalmic ICA inferiorly directed outpouching may represent a tiny aneurysm or the infundibular origin of a diminutive vessel. Electronically Signed   By: Kristine Garbe M.D.   On: 09/15/2017 21:42   Mr Cervical Spine Wo Contrast  Result Date: 09/16/2017 CLINICAL DATA:  LEFT-sided weakness, suspect cord compression. EXAM: MRI CERVICAL SPINE WITHOUT CONTRAST TECHNIQUE: Multiplanar, multisequence MR imaging of the cervical spine was performed. No intravenous contrast was administered. COMPARISON:  CT cervical spine August 23, 2017 FINDINGS: ALIGNMENT: Straightened  cervical lordosis. No cervical malalignment. Grade 1 T1-2 anterolisthesis on a degenerative basis. VERTEBRAE/DISCS: Vertebral bodies are intact. Severe C5-6 disc height loss with proportional acute on chronic discogenic endplate changes. Moderate C6-7, mild C3-4 and C4-5 disc height loss with mild-to-moderate chronic discogenic endplate changes. Diffuse disc desiccation. Generalized bright T1 bone marrow signal compatible with osteopenia. Severe LEFT C1-2 lateral mass arthropathy. CORD:Myelomalacia and potential superimposed edema cervical spinal cord from C4-5 through C6-7. No syrinx. POSTERIOR FOSSA, VERTEBRAL ARTERIES, PARASPINAL TISSUES: No MR findings of ligamentous injury. Vertebral artery flow voids present. Included posterior fossa and paraspinal soft tissues are normal. Low signal pannus about the odontoid process seen with CPPD. DISC LEVELS: C2-3: Annular bulging, uncovertebral hypertrophy. Severe LEFT facet arthropathy. No canal stenosis. Mild RIGHT, moderate LEFT neural foraminal narrowing. C3-4: 4 mm broad-based disc bulge, uncovertebral hypertrophy, mild RIGHT and severe LEFT facet arthropathy. Moderate canal stenosis. Severe bilateral neural foraminal narrowing. C4-5: Broad-based disc bulge and RIGHT central disc protrusion total measuring 3 mm in AP dimension. Uncovertebral hypertrophy and moderate RIGHT facet arthropathy. Moderate canal stenosis. Severe bilateral neural foraminal narrowing. C5-6: 5 mm broad-based disc bulge with superimposed undersurface disc protrusion, uncovertebral hypertrophy and mild facet arthropathy. Severe canal stenosis, AP dimension of the spinal canal is 4 mm. Severe bilateral neural foraminal narrowing. C6-7: 4 mm broad-based disc bulge. Uncovertebral hypertrophy. Moderate canal stenosis. Moderate to severe neural foraminal narrowing. C7-T1: No disc bulge. Moderate RIGHT greater LEFT facet arthropathy without canal stenosis. Mild RIGHT and moderate LEFT neural foraminal  narrowing. IMPRESSION: 1. Degenerative change of the cervical spine without fracture or malalignment. 2. C4-5 through C6-7 myelomalacia and possible cord edema. 3. Severe canal stenosis C5-6 with cord compression. Moderate canal stenosis C3-4, C4-5 and C6-7. 4. Neural foraminal narrowing all cervical levels: Severe from  C3-4 to C5-6. Electronically Signed   By: Courtnay  Bloomer M.D.   On: 09/16/2017 00:53   Ct Head Code Stroke Wo Contrast  Result Date: 09/15/2017 CLINICAL DATA:  Code stroke.  Left-sided weakness with unsteady gait EXAM: CT HEAD WITHOUT CONTRAST TECHNIQUE: Contiguous axial images were obtained from the base of the skull through the vertex without intravenous contrast. COMPARISON:  08/23/2017 FINDINGS: Brain: No evidence of acute infarction, hemorrhage, hydrocephalus, extra-axial collection or mass lesion/mass effect. Generalized atrophy and periventricular chronic small vessel ischemia that is mild for age. Vascular: No hyperdense vessel.  Atherosclerotic calcification. Skull: No acute finding.  Left C1-2 severe facet arthropathy. Sinuses/Orbits: Bilateral glaucoma reservoir. Cataract resection bilateral Other: These results were communicated to Dr. Sethi at 3:47 pmon 8/20/2019by text page via the [employer] messaging system. ASPECTS ([place] Stroke Program Early CT Score) - Ganglionic level infarction (caudate, lentiform nuclei, internal capsule, insula, M1-M3 cortex): 7 - Supraganglionic infarction (M4-M6 cortex): 3 Total score (0-10 with 10 being normal): 10 IMPRESSION: No acute finding.  ASPECTS is 10. Electronically Signed   By: Jonathon  Watts M.D.   On: 09/15/2017 15:48    Pertinent items noted in HPI and remainder of comprehensive ROS otherwise negative. Blood pressure (!) 149/87, pulse 71, temperature 97.7 F (36.5 C), resp. rate 20, height 5' 5" (1.651 m), weight 64.3 kg, SpO2 100 %. Patient is awake and alert.  She is oriented and appropriate.  Her speech is fluent.  Her judgment  insight are intact.  Her cranial nerve function is normal bilaterally.  Motor examination reveals weakness of her left triceps muscle group grading out at 3/5.  She has weakness of her left wrist extensors grading out at 4/5.  She has weakness of her grips bilaterally grading out at 4/5.  She has intrinsic weakness bilaterally grading out at 4/5 on the right and 4-/5 on the left.  She has weakness and spasticity of her left lower extremity with 4/5 strength.  She has reasonably normal strength in her right lower extremity.  Sensory examination with decreased sensation in both distal upper extremities left greater than right.  She has decreased sensation in both lower extremities.  Reflexes are normal at both biceps.  They are absent in her left and right triceps.  They are increased with some clonus in her left patella.  Her toes are upgoing to plantar stimulation.  Examination head ears eyes and throat is unremarkable.  Chest and abdomen are benign.  Extremities are free from injury deformity.  Assessment/Plan: Patient with severe cervical stenosis secondary to multilevel spondylosis.  Patient with critical spinal cord compression at C5-6 but also with severe stenosis and cord compression at C3-4, C4-5 and C6-7.  I discussed options available for treatment including both operative and nonoperative care.  I recommended that we move forward with a C3-4, C4-5, C5-6 and C6-7 anterior cervical discectomy with interbody fusion utilizing interbody peek cages, locally harvested autograft, and augmented with anterior plate instrumentation.  I discussed the risks involved with surgery including but not limited to the risk of anesthesia, bleeding, infection, CSF leak, nerve root injury, spinal cord injury, fusion failure, his mentation failure, dysphagia, dysphonia, continued pain, and non-benefit.  The patient has been given the opportunity has questions as has her daughter.  They wish to proceed with surgery.  Because of  the patient's aspirin and Plavix use the earliest possible time to do the surgery will be Monday afternoon.  If patient is stable and safe with mobilization then she   can be discharged home.  Cooper Render Bellami Farrelly 09/16/2017, 8:47 AM

## 2017-09-17 DIAGNOSIS — R531 Weakness: Secondary | ICD-10-CM | POA: Diagnosis not present

## 2017-09-17 LAB — GLUCOSE, CAPILLARY
GLUCOSE-CAPILLARY: 193 mg/dL — AB (ref 70–99)
GLUCOSE-CAPILLARY: 158 mg/dL — AB (ref 70–99)
Glucose-Capillary: 205 mg/dL — ABNORMAL HIGH (ref 70–99)
Glucose-Capillary: 174 mg/dL — ABNORMAL HIGH (ref 70–99)

## 2017-09-17 MED ORDER — DEXAMETHASONE 4 MG PO TABS: 4.0000 mg | ORAL_TABLET | Freq: Four times a day (QID) | ORAL | 0 refills | Status: DC

## 2017-09-17 MED ORDER — DOCUSATE SODIUM 100 MG PO CAPS
100.0000 mg | ORAL_CAPSULE | Freq: Every day | ORAL | Status: DC | PRN
  Administered 2017-09-17 – 2017-09-21 (×4): 100 mg via ORAL
  Filled 2017-09-17 (×4): qty 1

## 2017-09-17 NOTE — Progress Notes (Signed)
Inpatient Diabetes Program Recommendations  AACE/ADA: New Consensus Statement on Inpatient Glycemic Control (2019)  Target Ranges:  Prepandial:   less than 140 mg/dL      Peak postprandial:   less than 180 mg/dL (1-2 hours)      Critically ill patients:  140 - 180 mg/dL  Results for Asencion GowdaCAUTHEN, Sharday L (MRN [id number]) as of 09/17/2017 11:07  Ref. Range 09/16/2017 03:11 09/16/2017 09:01 09/16/2017 11:55 09/16/2017 16:33 09/16/2017 21:02 09/17/2017 07:35  Glucose-Capillary Latest Ref Range: 70 - 99 mg/dL 409126 (H) 811189 (H) 914218 (H) 223 (H) 175 (H) 193 (H)    Review of Glycemic Control  Diabetes history: DM2 Outpatient Diabetes medications: Metformin 1000 mg BID Current orders for Inpatient glycemic control: Novolog 0-9 units TID with meals, Novolog 0-5 units QHS, Metformin 1000 mg BID; Decadron 4 mg Q6H  Inpatient Diabetes Program Recommendations: Insulin - Meal Coverage: If Decadron is continued, please consider ordering Novolog 2 units TID with meals for meal coverage if patient eats at least 50% of meals.  Thanks, Orlando PennerMarie Claudie Rathbone, RN, MSN, CDE Diabetes Coordinator Inpatient Diabetes Program [phone] (Team Pager from 8am to 5pm)

## 2017-09-17 NOTE — Discharge Summary (Signed)
Physician Discharge Summary  Priscilla Tran ZOX:[id number] DOB: 1935-12-03 DOA: 09/15/2017  PCP: Tracey Harries, MD  Admit date: 09/15/2017 Discharge date: 09/17/2017  Admitted From: Home  Disposition:  Home health  Recommendations for Outpatient Follow-up:  1. Follow up with PCP in 1-2 weeks 2. Please obtain BMP/CBC in one week your next doctors visit.  3. Decadron orally 4 mg every 6 hours.  Needs to follow-up with outpatient neurosurgery on 8/26 for surgery for severe cervical stenosis.  Continue to hold aspirin and Plavix until then.  Home Health: Home health PT/OT Equipment/Devices: None Discharge Condition: Stable CODE STATUS: Full code Diet recommendation: 2 g salt diet  Brief/Interim Summary: 82 year old female with history of diabetes mellitus type 2, essential hypertension, glaucoma of the right eye came to the hospital with complains of worsening of her lower extremity weakness.  Upon admission she had CT of the cervical spine which showed concerning for severe cervical stenosis with possible cord compression.  Neurosurgery was consulted who recommended holding aspirin Plavix for 5 days and will need surgical intervention on 09/21/2017.  Patient was evaluated physical therapy in the hospital who recommended skilled nursing facility but family and the patient refused to go there.  Otherwise patient has been cleared by neurosurgery to be discharged from the hospital and can follow-up outpatient on 09/21/2017.  Decadron 4 mg every 6 hours orally has been prescribed Patient and her daughter understands that at this point there is no current active medical treatment should be keeping them in the hospital.   Discharge Diagnoses:  Principal Problem:   Left-sided weakness Active Problems:   Glaucoma associated with ocular inflammation, severe stage   Hypertension   Diabetes type 2, controlled (HCC)   Cervical spinal cord compression (HCC)  Left-sided weakness especially lower extremity  due to severe cervical stenosis -Plans for surgical intervention by neurosurgery on 09/21/2017.  Continue to hold aspirin and Plavix until further instructed by them post operatively.  Advised by physical therapy who recommended skilled nursing facility with family is reluctant to go there at this time.  Home health has been offered to them in the meantime otherwise stable from neurosurgery site and can be discharged. -She will be discharged on Decadron 4 mg every 6 hours orally until further instructed.  Essential hypertension -Resume home medications.  History of glaucoma -Continue home meds  SCDs while patient is here Full code Daughter at bedside Stable to be discharged from medical perspective and can follow-up outpatient.  Discharge Instructions   Allergies as of 09/17/2017      Reactions   Lisinopril Swelling   Face and throat. Breathing not affected per patient.      Medication List    TAKE these medications   amLODipine 10 MG tablet Commonly known as:  NORVASC Take 10 mg by mouth daily.   aspirin EC 81 MG tablet Take 81 mg by mouth daily.   carvedilol 12.5 MG tablet Commonly known as:  COREG Take 12.5 mg by mouth daily.   clopidogrel 75 MG tablet Commonly known as:  PLAVIX Take 1 tablet (75 mg total) by mouth daily.   dexamethasone 4 MG tablet Commonly known as:  DECADRON Take 1 tablet (4 mg total) by mouth 4 (four) times daily for 4 days.   dorzolamide-timolol 22.3-6.8 MG/ML ophthalmic solution Commonly known as:  COSOPT Place 1 drop into the left eye 2 (two) times daily.   hydrochlorothiazide 25 MG tablet Commonly known as:  HYDRODIURIL Take 25 mg by mouth every other  day.   levothyroxine 150 MCG tablet Commonly known as:  SYNTHROID, LEVOTHROID Take 150 mcg by mouth daily.   lovastatin 10 MG tablet Commonly known as:  MEVACOR Take 2 tablets (20 mg total) by mouth at bedtime.   metFORMIN 1000 MG tablet Commonly known as:  GLUCOPHAGE Take 1,000  mg by mouth 2 (two) times daily with a meal.   prednisoLONE acetate 1 % ophthalmic suspension Commonly known as:  PRED FORTE Place 1 drop into the left eye 2 (two) times daily.   VITAMIN B-12 PO Take 1 tablet by mouth daily.      Follow-up Information    Tracey Harries, MD. Schedule an appointment as soon as possible for a visit in 1 week(s).   Specialty:  Family Medicine Contact information: [address] [facility] [place] [address] [phone]        Julio Sicks, MD. Call in 1 day(s).   Specialty:  Neurosurgery Why:  Needs Surgery on Monday. Patient and the Surgeon aware.  Contact information: [address] [place] [address] [phone]          Allergies  Allergen Reactions  . Lisinopril Swelling    Face and throat. Breathing not affected per patient.    You were cared for by a hospitalist during your hospital stay. If you have any questions about your discharge medications or the care you received while you were in the hospital after you are discharged, you can call the unit and asked to speak with the hospitalist on call if the hospitalist that took care of you is not available. Once you are discharged, your primary care physician will handle any further medical issues. Please note that no refills for any discharge medications will be authorized once you are discharged, as it is imperative that you return to your primary care physician (or establish a relationship with a primary care physician if you do not have one) for your aftercare needs so that they can reassess your need for medications and monitor your lab values.  Consultations:  Neurosurgery   Procedures/Studies: Dg Chest 2 View  Result Date: 08/22/2017 CLINICAL DATA:  TIA, slurred speech, confusion.  Hypertension EXAM: CHEST - 2 VIEW COMPARISON:  None. FINDINGS: Heart and mediastinal contours are within normal limits. No focal opacities or  effusions. No acute bony abnormality. IMPRESSION: No active cardiopulmonary disease. Electronically Signed   By: Charlett Nose M.D.   On: 08/22/2017 20:47   Ct Cervical Spine Wo Contrast  Result Date: 08/23/2017 CLINICAL DATA:  Abnormal dens on MRI. EXAM: CT CERVICAL SPINE WITHOUT CONTRAST TECHNIQUE: Multidetector CT imaging of the cervical spine was performed without intravenous contrast. Multiplanar CT image reconstructions were also generated. COMPARISON:  Brain MRI from earlier today FINDINGS: Alignment: Mild levocurvature. Skull base and vertebrae: The dens is sclerotic with erosions towards the left aspect where there is remarkably severe C1-2 facet degeneration with subchondral erosions, sclerosis, and bulky spurring. No fracture or destructive lesion is seen. There is scattered soft tissue/discal calcifications. CPPD arthropathy is possible with this pattern. Soft tissues and spinal canal: Calcified thyroid nodules considered incidental. Carotid calcification Disc levels: C1-2: Atlantodental and left facet degeneration that is severe, as above. Left facet spurring impinges on the left C2 nerve root. C2-3: Asymmetric bulky left facet spurring. Left uncovertebral spurring. Left foraminal stenosis C3-4: Advanced disc narrowing with bulging of the calcified annulus. Left facet spurring. Uncovertebral spurring. Biforaminal impingement. Spinal stenosis with probable ventral  cord flattening. C4-5: Disc narrowing with annular calcification. Bilateral facet spurring. Disc osteophyte complex flattens the cord. Advanced biforaminal impingement. C5-6: Advanced disc narrowing with endplate ridging and a calcified disc protrusion. Severe spinal stenosis with implied cord flattening. Maximal AP canal diameter in the midline is 4 mm. Advanced biforaminal impingement C6-7: Disc narrowing with a calcified central disc protrusion presumably flattening the ventral cord. Mild or moderate bilateral foraminal narrowing.  C7-T1:Facet spurring and disc narrowing.  No visible impingement Upper chest: Negative . IMPRESSION: 1. MRI findings at the dens reflects severe atlantodental and left C1/2 facet degeneration with sclerosis and chronic erosions. Left facet spurring impinges on the left C2 nerve. Calcium pyrophosphate deposition disease is a consideration given the dens appearance and diffuse soft tissue calcifications. 2. Advanced cervical spine degeneration with cord impingement likely from C3-4 to C6-7. Spinal stenosis is severe at C5-6 with probable advanced cord flattening. 3. Diffuse biforaminal impingement as described. Electronically Signed   By: Marnee Spring M.D.   On: 08/23/2017 19:51   Mr Maxine Glenn Head Wo Contrast  Result Date: 09/15/2017 CLINICAL DATA:  82 y/o  F; left-sided weakness. EXAM: MRI HEAD WITHOUT CONTRAST MRA HEAD WITHOUT CONTRAST TECHNIQUE: Multiplanar, multiecho pulse sequences of the brain and surrounding structures were obtained without intravenous contrast. Angiographic images of the head were obtained using MRA technique without contrast. COMPARISON:  09/15/2017 CT head. FINDINGS: MRI HEAD FINDINGS Brain: No acute infarction, hydrocephalus, extra-axial collection or mass lesion. Several stable nonspecific T2 FLAIR hyperintensities in subcortical and periventricular white matter are compatible with mild chronic microvascular ischemic changes for age. Mild volume loss of the brain. There is sulcal susceptibility hypointensity within the sylvian fissures bilaterally as well as punctate foci within the occipital horns of lateral ventricles and within the fourth ventricle (series 10, image 54, 48, 27). Findings are compatible with cirrhosis. Susceptibility hypointensity within the sylvian fissures is stable from MRI of the brain. Punctate foci within the lateral and fourth ventricle are newly apparent. Vascular: As below. Skull and upper cervical spine: Normal marrow signal. Sinuses/Orbits: Negative. Other:  None. MRA HEAD FINDINGS Anterior circulation: Left paraophthalmic artery 2 mm inferiorly directed outpouching (series 551, image 7 and series 5, image 92) possibly representing a tiny aneurysm versus is prominent infundibulum of diminutive vessel. No large vessel occlusion significant stenosis is identified. Posterior circulation: Left P3 superior division origin severe stenosis. No large vessel occlusion, aneurysm, or additional segment of significant stenosis is identified. Anatomic variant: Bilateral fetal PCA. IMPRESSION: MRI head: 1. Stable mild siderosis within the sylvian fissures. Newly apparent tiny foci of blood products within the occipital horns of lateral ventricles and fourth ventricle are probably due to differences in technique with the current SWAN being more sensitive than the prior SWI. New hemorrhage is less likely. 2. No stroke, mass effect, or herniation. 3. Stable mild chronic microvascular ischemic changes and volume loss of the brain. MRA head: 1. Stable left P3 superior division severe stenosis. 2. No large vessel occlusion or new segment of significant stenosis is identified. 3. 2 mm left paraophthalmic ICA inferiorly directed outpouching may represent a tiny aneurysm or the infundibular origin of a diminutive vessel. Electronically Signed   By: Mitzi Hansen M.D.   On: 09/15/2017 21:42   Mr Brain Wo Contrast  Result Date: 09/15/2017 CLINICAL DATA:  82 y/o  F; left-sided weakness. EXAM: MRI HEAD WITHOUT CONTRAST MRA HEAD WITHOUT CONTRAST TECHNIQUE: Multiplanar, multiecho pulse sequences of the brain and surrounding structures were obtained without intravenous contrast.  Angiographic images of the head were obtained using MRA technique without contrast. COMPARISON:  09/15/2017 CT head. FINDINGS: MRI HEAD FINDINGS Brain: No acute infarction, hydrocephalus, extra-axial collection or mass lesion. Several stable nonspecific T2 FLAIR hyperintensities in subcortical and  periventricular white matter are compatible with mild chronic microvascular ischemic changes for age. Mild volume loss of the brain. There is sulcal susceptibility hypointensity within the sylvian fissures bilaterally as well as punctate foci within the occipital horns of lateral ventricles and within the fourth ventricle (series 10, image 54, 48, 27). Findings are compatible with cirrhosis. Susceptibility hypointensity within the sylvian fissures is stable from MRI of the brain. Punctate foci within the lateral and fourth ventricle are newly apparent. Vascular: As below. Skull and upper cervical spine: Normal marrow signal. Sinuses/Orbits: Negative. Other: None. MRA HEAD FINDINGS Anterior circulation: Left paraophthalmic artery 2 mm inferiorly directed outpouching (series 551, image 7 and series 5, image 92) possibly representing a tiny aneurysm versus is prominent infundibulum of diminutive vessel. No large vessel occlusion significant stenosis is identified. Posterior circulation: Left P3 superior division origin severe stenosis. No large vessel occlusion, aneurysm, or additional segment of significant stenosis is identified. Anatomic variant: Bilateral fetal PCA. IMPRESSION: MRI head: 1. Stable mild siderosis within the sylvian fissures. Newly apparent tiny foci of blood products within the occipital horns of lateral ventricles and fourth ventricle are probably due to differences in technique with the current SWAN being more sensitive than the prior SWI. New hemorrhage is less likely. 2. No stroke, mass effect, or herniation. 3. Stable mild chronic microvascular ischemic changes and volume loss of the brain. MRA head: 1. Stable left P3 superior division severe stenosis. 2. No large vessel occlusion or new segment of significant stenosis is identified. 3. 2 mm left paraophthalmic ICA inferiorly directed outpouching may represent a tiny aneurysm or the infundibular origin of a diminutive vessel. Electronically  Signed   By: Mitzi Hansen M.D.   On: 09/15/2017 21:42   Mr Brain Wo Contrast  Result Date: 08/23/2017 CLINICAL DATA:  TIA.  Facial droop. EXAM: MRI HEAD WITHOUT CONTRAST MRA HEAD WITHOUT CONTRAST TECHNIQUE: Multiplanar, multiecho pulse sequences of the brain and surrounding structures were obtained without intravenous contrast. Angiographic images of the head were obtained using MRA technique without contrast. COMPARISON:  CT head without contrast 08/22/2017. FINDINGS: MRI HEAD FINDINGS Brain: The diffusion-weighted images demonstrate no acute or subacute infarct to explain the patient's symptoms. Periventricular and subcortical T2 changes bilaterally are mildly advanced for age. There is mild generalized atrophy. White matter changes extend into the brainstem. Cerebellum is unremarkable. The lesion in the right thalamus is remote. No acute hemorrhage or mass lesion is present. The ventricles are proportionate to the degree of atrophy. No significant extra-axial fluid collection is present. Vascular: Flow is present in the major intracranial arteries. Skull and upper cervical spine: The skull base is within normal limits. There is loss of normal marrow signal at the dens. This may be degenerative. There is no displacement. Degenerative changes are present at C3-4 and C4-5. Marrow signal is otherwise normal. Sinuses/Orbits: The paranasal sinuses clear. Scleral banding is present at the right globe. Bilateral lens replacements are noted. Globes and orbits are otherwise within normal limits. MRA HEAD FINDINGS Mild atherosclerotic changes are present in the cavernous internal carotid arteries bilaterally. There is no significant stenosis relative to the ICA termini. Prominent posterior communicating arteries are present bilaterally. The A1 and M1 segments are normal. No definite anterior communicating artery is visualized. MCA  bifurcations are within normal limits bilaterally. ACA and MCA branch vessels  are normal. The right vertebral artery is the dominant vessel. Left PICA origin is visualized and normal. The right AICA is dominant. The basilar artery is small, terminating at the superior cerebellar arteries bilaterally. Posterior cerebral arteries are of fetal type bilaterally. There is a high-grade stenosis involving the superior left P3 division. IMPRESSION: 1. No acute intracranial abnormality to account for the patient's symptoms. 2. Diffuse white matter changes are mildly advanced for age. This likely reflects the sequela of chronic microvascular ischemia. 3. MRA circle-of-Willis demonstrates a high-grade stenosis in the superior left P3 segment. 4. Other more distal small vessel disease without significant proximal stenosis, aneurysm, or branch vessel occlusion otherwise. 5. Loss of normal T1 marrow signal at the dens. While this is most likely degenerative, trauma or neoplasm is not excluded on the basis of these films. Recommend non emergent follow-up CT scan of the cervical spine for further evaluation. Electronically Signed   By: Marin Roberts M.D.   On: 08/23/2017 09:56   Mr Cervical Spine Wo Contrast  Result Date: 09/16/2017 CLINICAL DATA:  LEFT-sided weakness, suspect cord compression. EXAM: MRI CERVICAL SPINE WITHOUT CONTRAST TECHNIQUE: Multiplanar, multisequence MR imaging of the cervical spine was performed. No intravenous contrast was administered. COMPARISON:  CT cervical spine August 23, 2017 FINDINGS: ALIGNMENT: Straightened cervical lordosis. No cervical malalignment. Grade 1 T1-2 anterolisthesis on a degenerative basis. VERTEBRAE/DISCS: Vertebral bodies are intact. Severe C5-6 disc height loss with proportional acute on chronic discogenic endplate changes. Moderate C6-7, mild C3-4 and C4-5 disc height loss with mild-to-moderate chronic discogenic endplate changes. Diffuse disc desiccation. Generalized bright T1 bone marrow signal compatible with osteopenia. Severe LEFT C1-2 lateral  mass arthropathy. CORD:Myelomalacia and potential superimposed edema cervical spinal cord from C4-5 through C6-7. No syrinx. POSTERIOR FOSSA, VERTEBRAL ARTERIES, PARASPINAL TISSUES: No MR findings of ligamentous injury. Vertebral artery flow voids present. Included posterior fossa and paraspinal soft tissues are normal. Low signal pannus about the odontoid process seen with CPPD. DISC LEVELS: C2-3: Annular bulging, uncovertebral hypertrophy. Severe LEFT facet arthropathy. No canal stenosis. Mild RIGHT, moderate LEFT neural foraminal narrowing. C3-4: 4 mm broad-based disc bulge, uncovertebral hypertrophy, mild RIGHT and severe LEFT facet arthropathy. Moderate canal stenosis. Severe bilateral neural foraminal narrowing. C4-5: Broad-based disc bulge and RIGHT central disc protrusion total measuring 3 mm in AP dimension. Uncovertebral hypertrophy and moderate RIGHT facet arthropathy. Moderate canal stenosis. Severe bilateral neural foraminal narrowing. C5-6: 5 mm broad-based disc bulge with superimposed undersurface disc protrusion, uncovertebral hypertrophy and mild facet arthropathy. Severe canal stenosis, AP dimension of the spinal canal is 4 mm. Severe bilateral neural foraminal narrowing. C6-7: 4 mm broad-based disc bulge. Uncovertebral hypertrophy. Moderate canal stenosis. Moderate to severe neural foraminal narrowing. C7-T1: No disc bulge. Moderate RIGHT greater LEFT facet arthropathy without canal stenosis. Mild RIGHT and moderate LEFT neural foraminal narrowing. IMPRESSION: 1. Degenerative change of the cervical spine without fracture or malalignment. 2. C4-5 through C6-7 myelomalacia and possible cord edema. 3. Severe canal stenosis C5-6 with cord compression. Moderate canal stenosis C3-4, C4-5 and C6-7. 4. Neural foraminal narrowing all cervical levels: Severe from C3-4 to C5-6. Electronically Signed   By: Awilda Metro M.D.   On: 09/16/2017 00:53   Mr Maxine Glenn Head Wo Contrast  Result Date:  08/23/2017 CLINICAL DATA:  TIA.  Facial droop. EXAM: MRI HEAD WITHOUT CONTRAST MRA HEAD WITHOUT CONTRAST TECHNIQUE: Multiplanar, multiecho pulse sequences of the brain and surrounding structures were obtained without intravenous  contrast. Angiographic images of the head were obtained using MRA technique without contrast. COMPARISON:  CT head without contrast 08/22/2017. FINDINGS: MRI HEAD FINDINGS Brain: The diffusion-weighted images demonstrate no acute or subacute infarct to explain the patient's symptoms. Periventricular and subcortical T2 changes bilaterally are mildly advanced for age. There is mild generalized atrophy. White matter changes extend into the brainstem. Cerebellum is unremarkable. The lesion in the right thalamus is remote. No acute hemorrhage or mass lesion is present. The ventricles are proportionate to the degree of atrophy. No significant extra-axial fluid collection is present. Vascular: Flow is present in the major intracranial arteries. Skull and upper cervical spine: The skull base is within normal limits. There is loss of normal marrow signal at the dens. This may be degenerative. There is no displacement. Degenerative changes are present at C3-4 and C4-5. Marrow signal is otherwise normal. Sinuses/Orbits: The paranasal sinuses clear. Scleral banding is present at the right globe. Bilateral lens replacements are noted. Globes and orbits are otherwise within normal limits. MRA HEAD FINDINGS Mild atherosclerotic changes are present in the cavernous internal carotid arteries bilaterally. There is no significant stenosis relative to the ICA termini. Prominent posterior communicating arteries are present bilaterally. The A1 and M1 segments are normal. No definite anterior communicating artery is visualized. MCA bifurcations are within normal limits bilaterally. ACA and MCA branch vessels are normal. The right vertebral artery is the dominant vessel. Left PICA origin is visualized and normal.  The right AICA is dominant. The basilar artery is small, terminating at the superior cerebellar arteries bilaterally. Posterior cerebral arteries are of fetal type bilaterally. There is a high-grade stenosis involving the superior left P3 division. IMPRESSION: 1. No acute intracranial abnormality to account for the patient's symptoms. 2. Diffuse white matter changes are mildly advanced for age. This likely reflects the sequela of chronic microvascular ischemia. 3. MRA circle-of-Willis demonstrates a high-grade stenosis in the superior left P3 segment. 4. Other more distal small vessel disease without significant proximal stenosis, aneurysm, or branch vessel occlusion otherwise. 5. Loss of normal T1 marrow signal at the dens. While this is most likely degenerative, trauma or neoplasm is not excluded on the basis of these films. Recommend non emergent follow-up CT scan of the cervical spine for further evaluation. Electronically Signed   By: Marin Roberts M.D.   On: 08/23/2017 09:56   Ct Head Code Stroke Wo Contrast  Result Date: 09/15/2017 CLINICAL DATA:  Code stroke.  Left-sided weakness with unsteady gait EXAM: CT HEAD WITHOUT CONTRAST TECHNIQUE: Contiguous axial images were obtained from the base of the skull through the vertex without intravenous contrast. COMPARISON:  08/23/2017 FINDINGS: Brain: No evidence of acute infarction, hemorrhage, hydrocephalus, extra-axial collection or mass lesion/mass effect. Generalized atrophy and periventricular chronic small vessel ischemia that is mild for age. Vascular: No hyperdense vessel.  Atherosclerotic calcification. Skull: No acute finding.  Left C1-2 severe facet arthropathy. Sinuses/Orbits: Bilateral glaucoma reservoir. Cataract resection bilateral Other: These results were communicated to Dr. Pearlean Brownie at 3:47 pmon 8/20/2019by text page via the [facility] messaging system. ASPECTS [facility] Stroke Program Early CT Score) - Ganglionic level infarction (caudate,  lentiform nuclei, internal capsule, insula, M1-M3 cortex): 7 - Supraganglionic infarction (M4-M6 cortex): 3 Total score (0-10 with 10 being normal): 10 IMPRESSION: No acute finding.  ASPECTS is 10. Electronically Signed   By: Marnee Spring M.D.   On: 09/15/2017 15:48   Ct Head Code Stroke Wo Contrast  Result Date: 08/22/2017 CLINICAL DATA:  Code stroke. Left-sided facial droop  with slurred speech. EXAM: CT HEAD WITHOUT CONTRAST TECHNIQUE: Contiguous axial images were obtained from the base of the skull through the vertex without intravenous contrast. COMPARISON:  None. FINDINGS: Brain: There is no mass, hemorrhage or extra-axial collection. The size and configuration of the ventricles and extra-axial CSF spaces are normal. Age indeterminate small vessel infarct of the right thalamus. There is hypoattenuation of the periventricular white matter, most commonly indicating chronic ischemic microangiopathy. Vascular: No abnormal hyperdensity of the major intracranial arteries or dural venous sinuses. No intracranial atherosclerosis. Skull: The visualized skull base, calvarium and extracranial soft tissues are normal. Sinuses/Orbits: No fluid levels or advanced mucosal thickening of the visualized paranasal sinuses. No mastoid or middle ear effusion. The orbits are normal. ASPECTS [facility]([place] Stroke Program Early CT Score) - Ganglionic level infarction (caudate, lentiform nuclei, internal capsule, insula, M1-M3 cortex): 7 - Supraganglionic infarction (M4-M6 cortex): 3 Total score (0-10 with 10 being normal): 10 IMPRESSION: 1. No acute hemorrhage. 2. Age-indeterminate small vessel infarct of the right thalamus. MRI could be obtained for better temporal characterization, if indicated. 3. ASPECTS is 10. 4. These results were called by telephone at the time of interpretation on 08/22/2017 at 5:46 pm to Dr. Marily MemosJASON MESNER , who verbally acknowledged these results. Electronically Signed   By: Deatra RobinsonKevin  Herman M.D.   On: 08/22/2017  17:46     Subjective: No new complaints besides LLE weakness. They understand there is no further inpatient intervetion necessary at this time, but do not feel comfortable taking the patient home.  SNF has been offered but patient and the family doesn't want to go there. Home health has been offered as well.   Discharge Exam: Vitals:   09/17/17 0527 09/17/17 1251  BP: (!) 141/79 130/70  Pulse: 86 74  Resp: 17 18  Temp: 98.7 F (37.1 C) 98.8 F (37.1 C)  SpO2: 98% 98%   Vitals:   09/16/17 1524 09/16/17 2104 09/17/17 0527 09/17/17 1251  BP: 119/62 139/68 (!) 141/79 130/70  Pulse: 87 94 86 74  Resp: 18 17 17 18   Temp: 98.6 F (37 C) 98.5 F (36.9 C) 98.7 F (37.1 C) 98.8 F (37.1 C)  TempSrc: Oral Oral Oral   SpO2: 96% 95% 98% 98%  Weight:      Height:        General: Pt is alert, awake, not in acute distress Cardiovascular: RRR, S1/S2 +, no rubs, no gallops Respiratory: CTA bilaterally, no wheezing, no rhonchi Abdominal: Soft, NT, ND, bowel sounds + Extremities: no edema, no cyanosis    The results of significant diagnostics from this hospitalization (including imaging, microbiology, ancillary and laboratory) are listed below for reference.     Microbiology: No results found for this or any previous visit (from the past 240 hour(s)).   Labs: BNP (last 3 results) No results for input(s): BNP in the last 8760 hours. Basic Metabolic Panel: Recent Labs  Lab 09/15/17 1531 09/15/17 1537  NA 138 137  K 3.7 3.6  CL 101 98  CO2 26  --   GLUCOSE 161* 157*  BUN 11 12  CREATININE 0.62 0.50  CALCIUM 10.0  --    Liver Function Tests: Recent Labs  Lab 09/15/17 1531  AST 21  ALT 12  ALKPHOS 66  BILITOT 0.8  PROT 7.7  ALBUMIN 4.5   No results for input(s): LIPASE, AMYLASE in the last 168 hours. No results for input(s): AMMONIA in the last 168 hours. CBC: Recent Labs  Lab 09/15/17 1531 09/15/17  1537  WBC 7.2  --   NEUTROABS 4.9  --   HGB 12.4 13.6   HCT 38.3 40.0  MCV 82.2  --   PLT 228  --    Cardiac Enzymes: No results for input(s): CKTOTAL, CKMB, CKMBINDEX, TROPONINI in the last 168 hours. BNP: Invalid input(s): POCBNP CBG: Recent Labs  Lab 09/16/17 1155 09/16/17 1633 09/16/17 2102 09/17/17 0735 09/17/17 1204  GLUCAP 218* 223* 175* 193* 205*   D-Dimer No results for input(s): DDIMER in the last 72 hours. Hgb A1c No results for input(s): HGBA1C in the last 72 hours. Lipid Profile No results for input(s): CHOL, HDL, LDLCALC, TRIG, CHOLHDL, LDLDIRECT in the last 72 hours. Thyroid function studies No results for input(s): TSH, T4TOTAL, T3FREE, THYROIDAB in the last 72 hours.  Invalid input(s): FREET3 Anemia work up No results for input(s): VITAMINB12, FOLATE, FERRITIN, TIBC, IRON, RETICCTPCT in the last 72 hours. Urinalysis    Component Value Date/Time   COLORURINE COLORLESS (A) 09/15/2017 1849   APPEARANCEUR CLEAR 09/15/2017 1849   LABSPEC 1.003 (L) 09/15/2017 1849   PHURINE 7.0 09/15/2017 1849   GLUCOSEU NEGATIVE 09/15/2017 1849   HGBUR NEGATIVE 09/15/2017 1849   BILIRUBINUR NEGATIVE 09/15/2017 1849   BILIRUBINUR neg 03/30/2011 1924   KETONESUR NEGATIVE 09/15/2017 1849   PROTEINUR NEGATIVE 09/15/2017 1849   UROBILINOGEN 0.2 03/31/2011 0807   NITRITE NEGATIVE 09/15/2017 1849   LEUKOCYTESUR NEGATIVE 09/15/2017 1849   Sepsis Labs Invalid input(s): PROCALCITONIN,  WBC,  LACTICIDVEN Microbiology No results found for this or any previous visit (from the past 240 hour(s)).   Time coordinating discharge:  I have spent 35 minutes face to face with the patient and on the ward discussing the patients care, assessment, plan and disposition with other care givers. >50% of the time was devoted counseling the patient about the risks and benefits of treatment/Discharge disposition and coordinating care.   SIGNED:   Dimple Nanas, MD  [facility] 09/17/2017, 3:27 PM Pager   If 7PM-7AM, please contact  night-coverage [email or web address] Password [email or web address]

## 2017-09-17 NOTE — Progress Notes (Signed)
Patient is refusing all eye drops.

## 2017-09-17 NOTE — Progress Notes (Signed)
NCM spoke with pt/pt's daughter(Sheila) regarding d/c plan. MD ordered pt to d/c to home with home health services. Pt declined SNF placement per PT's recommendation. Pt' s daughter stated she's appealling the d/c. NCM made MD aware.  Gae GallopAngela Toure Edmonds BSN,BSN,CM

## 2017-09-17 NOTE — Progress Notes (Signed)
Physical Therapy Treatment Patient Details Name: Priscilla Tran MRN: [id number] DOB: 1936-01-26 Today's Date: 09/17/2017    History of Present Illness Pt is a 82 y.o. F with significant PMH of DM, HLD, HTN. Admitted with 36 hours of left upper extremity and left lower extremity weakness. Has severe cervical stenosis secondary to multilevel spondylosis. Critical spinal cord compression at C5-6 and severe stenosis and cord compression at C3-4, C4-5, C6-7.    PT Comments    Pt daughter plans to take patient home prior to surgery on Monday. Session focused on continued gait training and family training for home safety. Patient continuing to require moderate assistance for ambulation in room with walker, demonstrating significant imbalance with turning. Has very uncoordinated, irregular gait due to decreased left leg proprioception but was able to improve control with verbal cueing for shorter steps and placement inside walker. Extensively educated and demonstrated to pt daughter proper guarding technique, provided with gait belt, and encouraged limited ambulation at home to prevent risk of falls. Also discussed expectations for precautions following cervical surgery. Pt daughter verbalized understanding and demonstrates excellent carryover with instructions.   Follow Up Recommendations  SNF;Supervision/Assistance - 24 hour     Equipment Recommendations  3in1 (PT)    Recommendations for Other Services       Precautions / Restrictions Precautions Precautions: Fall Required Braces or Orthoses: Other Brace/Splint Other Brace/Splint: soft c collar Restrictions Weight Bearing Restrictions: No    Mobility  Bed Mobility Overal bed mobility: Needs Assistance Bed Mobility: Supine to Sit     Supine to sit: Supervision     General bed mobility comments: supervision for safety with verbal reminders for log roll technique  Transfers Overall transfer level: Needs assistance Equipment used:  Rolling walker (2 wheeled) Transfers: Sit to/from [employer] Sit to Stand: Min guard;Min assist         General transfer comment: Requiring up to light min assist for transitioning from sit to stand from edge of bed and toilet. Use of forward momentum to stand  Ambulation/Gait Ambulation/Gait assistance: Mod assist Gait Distance (Feet): 20 Feet, 20 feet (seated rest break in between) Assistive device: Rolling walker (2 wheeled) Gait Pattern/deviations: Step-through pattern;Wide base of support;Staggering left;Staggering right Gait velocity: decr   General Gait Details: Patient with irregular, uncoordinated gait pattern with difficulty with foot placement due to no proprioception in left leg. cues for sequencing with walker, shorter steps for increased control, and safety with turning   Stairs             Wheelchair Mobility    Modified Rankin (Stroke Patients Only) Modified Rankin (Stroke Patients Only) Pre-Morbid Rankin Score: Slight disability Modified Rankin: Moderately severe disability     Balance Overall balance assessment: Needs assistance Sitting-balance support: No upper extremity supported;Feet supported Sitting balance-Leahy Scale: Fair     Standing balance support: Bilateral upper extremity supported Standing balance-Leahy Scale: Poor                              Cognition Arousal/Alertness: Awake/alert Behavior During Therapy: WFL for tasks assessed/performed Overall Cognitive Status: Within Functional Limits for tasks assessed                                 General Comments: Somewhat decreased insight into deficits and slightly impulsive      Exercises  General Comments        Pertinent Vitals/Pain Pain Assessment: Faces Faces Pain Scale: Hurts little more Pain Location: mid thoracic region Pain Descriptors / Indicators: Discomfort;Guarding;Grimacing Pain Intervention(s): Monitored during  session    Home Living                      Prior Function            PT Goals (current goals can now be found in the care plan section) Acute Rehab PT Goals Patient Stated Goal: pt family states they don't want her to fall before surgery PT Goal Formulation: With patient/family Time For Goal Achievement: 09/30/17 Potential to Achieve Goals: Good Progress towards PT goals: Progressing toward goals    Frequency    Min 5X/week      PT Plan Current plan remains appropriate    Co-evaluation              AM-PAC PT "6 Clicks" Daily Activity  Outcome Measure  Difficulty turning over in bed (including adjusting bedclothes, sheets and blankets)?: None Difficulty moving from lying on back to sitting on the side of the bed? : A Little Difficulty sitting down on and standing up from a chair with arms (e.g., wheelchair, bedside commode, etc,.)?: Unable Help needed moving to and from a bed to chair (including a wheelchair)?: A Little Help needed walking in hospital room?: A Lot Help needed climbing 3-5 steps with a railing? : Total 6 Click Score: 14    End of Session Equipment Utilized During Treatment: Gait belt Activity Tolerance: Patient tolerated treatment well Patient left: with call bell/phone within reach;with family/visitor present;in bed Nurse Communication: Mobility status PT Visit Diagnosis: Unsteadiness on feet (R26.81);Other abnormalities of gait and mobility (R26.89);Difficulty in walking, not elsewhere classified (R26.2);Pain Pain - part of body: (back)     Time: [id number] PT Time Calculation (min) (ACUTE ONLY): 32 min  Charges:  $Gait Training: 8-22 mins $Therapeutic Activity: 8-22 mins                     Laurina Bustlearoline Alfhild Partch, PT, DPT Acute Rehabilitation Services  Pager: [phone]   Vanetta MuldersCarloine H Jyoti Harju 09/17/2017, 12:03 PM

## 2017-09-17 NOTE — Clinical Social Work Note (Addendum)
Clinical Social Work Assessment  Patient Details  Name: Priscilla GowdaRita L Intrieri MRN: [id number] Date of Birth: 12/18/1935  Date of referral:  09/17/17               Reason for consult:  Facility Placement                Permission sought to share information with:  Facility Medical sales representativeContact Representative, Family Supports Permission granted to share information::  Yes, Verbal Permission Granted  Name::     Sandy/Sheila  Agency::  SNFs  Relationship::  Daughters  Contact Information:  [phone]  Housing/Transportation Living arrangements for the past 2 months:  Single Family Home Source of Information:  Patient, Adult Children Patient Interpreter Needed:  None Criminal Activity/Legal Involvement Pertinent to Current Situation/Hospitalization:  No - Comment as needed Significant Relationships:  Adult Children Lives with:  Self Do you feel safe going back to the place where you live?  No Need for family participation in patient care:  No (Coment)  Care giving concerns:  CSW received consult for possible SNF placement at time of discharge. CSW spoke with patient regarding PT recommendation of SNF placement at time of discharge. Patient reported that she did not want to go to a nursing facility, but patient's daughters at bedside told patient no one could take care of her at home and she is having surgery on Monday. CSW to continue to follow and assist with discharge planning needs.   Social Worker assessment / plan:  CSW spoke with patient and [facility]  Employment status:  Retired Database administratornsurance information:  Managed Medicare PT Recommendations:  Skilled Nursing Facility Information / Referral to community resources:  Skilled Nursing Facility  Patient/Family's Response to care:  Patient recognizes need for rehab before returning home and is agreeable to a SNF. CSW explained that there are only a few facilities in the area in network with [employer]  and provided SNF list. CSW explained that since patient is observation status, that we need to find a SNF willing to accept a Letter of Guarantee so patient can go to snf while [place]. CSW presented the two facilities able to do this ([facility] and patient's daughters adamantly refused both of them. CSW also presented option of privately paying for the SNF of their choice. Daughters stated they would call [facility] and told CSW they would call with questions. CSW alerted them that there is nothing they can do until the family chooses a SNF so they can start authorization. CSW let RNCM know of the situation.  Patient/Family's Understanding of and Emotional Response to Diagnosis, Current Treatment, and Prognosis:  Patient/family is realistic regarding therapy needs and expressed being hopeful for SNF placement. Patient expressed understanding of CSW role and discharge process as well as medical condition. No questions/concerns about plan or treatment.    Emotional Assessment Appearance:  Appears stated age Attitude/Demeanor/Rapport:  Apprehensive Affect (typically observed):  Appropriate Orientation:  Oriented to Self, Oriented to Place, Oriented to  Time, Oriented to Situation Alcohol / Substance use:  Not Applicable Psych involvement (Current and /or in the community):  No (Comment)  Discharge Needs  Concerns to be addressed:  Care Coordination Readmission within the last 30 days:  Yes Current discharge risk:  None Barriers to Discharge:  Continued Medical Work up   [employer] S Tarica Harl, LCSWA 09/17/2017, 8:54 AM

## 2017-09-17 NOTE — Progress Notes (Addendum)
Detailed Notice of Discharge  and HINN 12 signed regarding pt's appeal.Notices given to CMA for scanning.  Gae GallopAngela Annalena Piatt RN,BSN,CM

## 2017-09-18 DIAGNOSIS — R531 Weakness: Secondary | ICD-10-CM | POA: Diagnosis not present

## 2017-09-18 LAB — GLUCOSE, CAPILLARY
Glucose-Capillary: 165 mg/dL — ABNORMAL HIGH (ref 70–99)
Glucose-Capillary: 211 mg/dL — ABNORMAL HIGH (ref 70–99)
Glucose-Capillary: 186 mg/dL — ABNORMAL HIGH (ref 70–99)
Glucose-Capillary: 171 mg/dL — ABNORMAL HIGH (ref 70–99)

## 2017-09-18 NOTE — Progress Notes (Signed)
NCM issued and re-explained ABN @ bedside with pt and pt's daughter , Velna HatchetSheila. Mom gave daughter rights to sign form. Document signed and NCM copied form and place in CMA office for scanning. Gae GallopAngela Kalinda Romaniello RN,CM

## 2017-09-18 NOTE — [place] FL2 (Signed)
[place] MEDICAID FL2 LEVEL OF CARE SCREENING TOOL     IDENTIFICATION  Patient Name: Priscilla Tran Birthdate: 11-Oct-1935 Sex: female Admission Date (Current Location): 09/15/2017  [facility] [place]   Facility and Address:  [facility], [address], [place], [place] [address]      Provider Number: [id number]  Attending Physician Name and Address:  Dimple NanasAmin, Ankit Chirag, MD  Relative Name and Phone Number:       Current Level of Care: Hospital Recommended Level of Care: Skilled Nursing Facility Prior Approval Number:    Date Approved/Denied:   PASRR Number: [id number]  Discharge Plan: SNF    Current Diagnoses: Patient Active Problem List   Diagnosis Date Noted  . Cervical spinal cord compression (HCC) 09/15/2017  . Left-sided weakness 08/22/2017  . Chronic constipation 05/07/2011  . Diabetes type 2, controlled (HCC) 03/31/2011  . Hypothyroid   . Glaucoma associated with ocular inflammation, severe stage   . Disc degeneration, lumbar   . Hypertension     Orientation RESPIRATION BLADDER Height & Weight     Self, Time, Situation, Place  Normal Continent, External catheter Weight: 64.3 kg Height:  5\' 5"  (165.1 cm)  BEHAVIORAL SYMPTOMS/MOOD NEUROLOGICAL BOWEL NUTRITION STATUS      Continent Diet(Please see DC Summary)  AMBULATORY STATUS COMMUNICATION OF NEEDS Skin   Limited Assist Verbally Normal                       Personal Care Assistance Level of Assistance  Bathing, Feeding, Dressing Bathing Assistance: Limited assistance Feeding assistance: Limited assistance Dressing Assistance: Limited assistance     Functional Limitations Info  Sight, Hearing, Speech Sight Info: Impaired Hearing Info: Adequate Speech Info: Adequate    SPECIAL CARE FACTORS FREQUENCY  PT (By licensed PT), OT (By licensed OT)     PT Frequency: 5x/week OT Frequency: 3x/week            Contractures Contractures Info: Not  present    Additional Factors Info  Code Status, Allergies, Insulin Sliding Scale Code Status Info: Full Allergies Info: Lisinopril   Insulin Sliding Scale Info: 3x ddaily with meals and at bedtime       Current Medications (09/18/2017):  This is the current hospital active medication list Current Facility-Administered Medications  Medication Dose Route Frequency Provider Last Rate Last Dose  . acetaminophen (TYLENOL) tablet 650 mg  650 mg Oral Q4H PRN Hillary BowGardner, Jared M, DO       Or  . acetaminophen (TYLENOL) solution 650 mg  650 mg Per Tube Q4H PRN Hillary BowGardner, Jared M, DO       Or  . acetaminophen (TYLENOL) suppository 650 mg  650 mg Rectal Q4H PRN Hillary BowGardner, Jared M, DO      . amLODipine (NORVASC) tablet 10 mg  10 mg Oral Daily Lyda PeroneGardner, Jared M, DO   10 mg at 09/17/17 1035  . carvedilol (COREG) tablet 12.5 mg  12.5 mg Oral Q breakfast Hillary BowGardner, Jared M, DO   12.5 mg at 09/17/17 1034  . dexamethasone (DECADRON) injection 4 mg  4 mg Intravenous Q6H Lyda PeroneGardner, Jared M, DO   4 mg at 09/18/17 [id number]  . docusate sodium (COLACE) capsule 100 mg  100 mg Oral Daily PRN Webb SilversmithSmith, Laura Easterwood, NP   100 mg at 09/17/17 2344  . dorzolamide-timolol (COSOPT) 22.3-6.8 MG/ML ophthalmic solution 1 drop  1 drop Left Eye BID Hillary BowGardner, Jared M, DO      .  hydrochlorothiazide (HYDRODIURIL) tablet 25 mg  25 mg Oral Rhae Hammock M, DO   25 mg at 09/17/17 1035  . insulin aspart (novoLOG) injection 0-5 Units  0-5 Units Subcutaneous QHS Vann, Jessica U, DO      . insulin aspart (novoLOG) injection 0-9 Units  0-9 Units Subcutaneous TID WC Vann, Jessica U, DO   2 Units at 09/17/17 1751  . levothyroxine (SYNTHROID, LEVOTHROID) tablet 150 mcg  150 mcg Oral Q breakfast Hillary Bow, DO   150 mcg at 09/17/17 1034  . metFORMIN (GLUCOPHAGE) tablet 1,000 mg  1,000 mg Oral BID WC Lyda Perone M, DO   1,000 mg at 09/17/17 1752  . pravastatin (PRAVACHOL) tablet 20 mg  20 mg Oral q1800 Hillary Bow, DO   20 mg at  09/17/17 1753  . prednisoLONE acetate (PRED FORTE) 1 % ophthalmic suspension 1 drop  1 drop Left Eye BID Hillary Bow, DO         Discharge Medications: Please see discharge summary for a list of discharge medications.  Relevant Imaging Results:  Relevant Lab Results:   Additional Information SSN: [id number] [place], [place]

## 2017-09-18 NOTE — Progress Notes (Signed)
PROGRESS NOTE    Priscilla GowdaRita L Colt  GNF:[id number] DOB: 07-Aug-1935 DOA: 09/15/2017 PCP: Tracey HarriesBouska, David, MD   Brief Narrative:   82 year old female with history of diabetes mellitus type 2, essential hypertension, glaucoma of the right eye came to the hospital with complains of worsening of her lower extremity weakness.  Upon admission she had CT of the cervical spine which showed concerning for severe cervical stenosis with possible cord compression.  Neurosurgery was consulted who recommended holding aspirin Plavix for 5 days and will need surgical intervention on 09/21/2017.  Patient was evaluated physical therapy in the hospital who recommended skilled nursing facility but family and the patient refused to go there.  Otherwise patient has been cleared by neurosurgery to be discharged from the hospital and can follow-up outpatient on 09/21/2017.  Decadron 4 mg every 6 hours orally has been prescribed  Assessment & Plan:   Principal Problem:   Left-sided weakness Active Problems:   Glaucoma associated with ocular inflammation, severe stage   Hypertension   Diabetes type 2, controlled (HCC)   Cervical spinal cord compression (HCC)   Left-sided weakness especially lower extremity due to severe cervical stenosis; stable -Plans for surgical intervention by neurosurgery on 09/21/2017.  Continue to hold aspirin and Plavix until further instructed by them post operatively.  Advised by physical therapy who recommended skilled nursing facility with family is reluctant to go there at this time.  Home health has been offered to them in the meantime otherwise stable from neurosurgery site and can be discharged. -She will be discharged on Decadron 4 mg every 6 hours orally until further instructed.  Essential hypertension -Resume home medications.  History of glaucoma -Continue home meds   DVT prophylaxis: SCDs Code Status: Full  Family Communication:  None at bedside  Disposition Plan: Discharged  but it has been appealed by the family   Consultants:   Neurosurgery  Procedures:   None   Antimicrobials:   None   Subjective: Feels ok no new complaints besides LE weakness.   Review of Systems Otherwise negative except as per HPI, including: General: Denies fever, chills, night sweats or unintended weight loss. Resp: Denies cough, wheezing, shortness of breath. Cardiac: Denies chest pain, palpitations, orthopnea, paroxysmal nocturnal dyspnea. GI: Denies abdominal pain, nausea, vomiting, diarrhea or constipation GU: Denies dysuria, frequency, hesitancy or incontinence MS: Denies muscle aches, joint pain or swelling Neuro: Denies headache, neurologic deficits (focal weakness, numbness, tingling), abnormal gait Psych: Denies anxiety, depression, SI/HI/AVH Skin: Denies new rashes or lesions ID: Denies sick contacts, exotic exposures, travel  Objective: Vitals:   09/17/17 0527 09/17/17 1251 09/17/17 2038 09/18/17 0407  BP: (!) 141/79 130/70 137/67 120/64  Pulse: 86 74 73 72  Resp: 17 18 18 16   Temp: 98.7 F (37.1 C) 98.8 F (37.1 C) 98.7 F (37.1 C) 98 F (36.7 C)  TempSrc: Oral     SpO2: 98% 98% 96% 96%  Weight:      Height:        Intake/Output Summary (Last 24 hours) at 09/18/2017 1130 Last data filed at 09/18/2017 1000 Gross per 24 hour  Intake 600 ml  Output -  Net 600 ml   Filed Weights   09/16/17 0321  Weight: 64.3 kg    Examination:  General exam: Appears calm and comfortable  Respiratory system: Clear to auscultation. Respiratory effort normal. Cardiovascular system: S1 & S2 heard, RRR. No JVD, murmurs, rubs, gallops or clicks. No pedal edema. Gastrointestinal system: Abdomen is nondistended, soft and nontender.  No organomegaly or masses felt. Normal bowel sounds heard. Central nervous system: Alert and oriented. No focal neurological deficits. Extremities: Symmetric 3 x 5 power in lower extremity. Skin: No rashes, lesions or  ulcers Psychiatry: Judgement and insight appear normal. Mood & affect appropriate.   Data Reviewed:   CBC: Recent Labs  Lab 09/15/17 1531 09/15/17 1537  WBC 7.2  --   NEUTROABS 4.9  --   HGB 12.4 13.6  HCT 38.3 40.0  MCV 82.2  --   PLT 228  --    Basic Metabolic Panel: Recent Labs  Lab 09/15/17 1531 09/15/17 1537  NA 138 137  K 3.7 3.6  CL 101 98  CO2 26  --   GLUCOSE 161* 157*  BUN 11 12  CREATININE 0.62 0.50  CALCIUM 10.0  --    GFR: Estimated Creatinine Clearance: 51.3 mL/min (by C-G formula based on SCr of 0.5 mg/dL). Liver Function Tests: Recent Labs  Lab 09/15/17 1531  AST 21  ALT 12  ALKPHOS 66  BILITOT 0.8  PROT 7.7  ALBUMIN 4.5   No results for input(s): LIPASE, AMYLASE in the last 168 hours. No results for input(s): AMMONIA in the last 168 hours. Coagulation Profile: Recent Labs  Lab 09/15/17 1531  INR 0.96   Cardiac Enzymes: No results for input(s): CKTOTAL, CKMB, CKMBINDEX, TROPONINI in the last 168 hours. BNP (last 3 results) No results for input(s): PROBNP in the last 8760 hours. HbA1C: No results for input(s): HGBA1C in the last 72 hours. CBG: Recent Labs  Lab 09/17/17 0735 09/17/17 1204 09/17/17 1718 09/17/17 2140 09/18/17 0736  GLUCAP 193* 205* 174* 158* 165*   Lipid Profile: No results for input(s): CHOL, HDL, LDLCALC, TRIG, CHOLHDL, LDLDIRECT in the last 72 hours. Thyroid Function Tests: No results for input(s): TSH, T4TOTAL, FREET4, T3FREE, THYROIDAB in the last 72 hours. Anemia Panel: No results for input(s): VITAMINB12, FOLATE, FERRITIN, TIBC, IRON, RETICCTPCT in the last 72 hours. Sepsis Labs: No results for input(s): PROCALCITON, LATICACIDVEN in the last 168 hours.  No results found for this or any previous visit (from the past 240 hour(s)).     Radiology Studies: No results found.   Scheduled Meds: . amLODipine  10 mg Oral Daily  . carvedilol  12.5 mg Oral Q breakfast  . dexamethasone  4 mg Intravenous  Q6H  . dorzolamide-timolol  1 drop Left Eye BID  . hydrochlorothiazide  25 mg Oral QODAY  . insulin aspart  0-5 Units Subcutaneous QHS  . insulin aspart  0-9 Units Subcutaneous TID WC  . levothyroxine  150 mcg Oral Q breakfast  . metFORMIN  1,000 mg Oral BID WC  . pravastatin  20 mg Oral q1800  . prednisoLONE acetate  1 drop Left Eye BID   Continuous Infusions:   LOS: 0 days    I have spent 15 minutes face to face with the patient and on the ward discussing the patients care, assessment, plan and disposition with other care givers. >50% of the time was devoted counseling the patient about the risks and benefits of treatment and coordinating care.   Emnet Monk Joline Maxcy, MD [facility] Pager [phone]   If 7PM-7AM, please contact night-coverage [email or web address] Password [email or web address] 09/18/2017, 11:30 AM

## 2017-09-18 NOTE — Progress Notes (Signed)
CSW and RNCM discussed case with medical advisors since patient is medically stable for discharge. Since patient is in observation status, she does not have the right to appeal the discharge with Medicare.   CSW and RNCM spoke with patient's daughter, Velna HatchetSheila, to let her know that patient has been discharged and they are unable to appeal. Velna HatchetSheila states that she will not be taking her mother home due to not feeling that she will be safe there until her surgery on Monday. CSW asked patient's daughter to select SNF option so we could at least start the insurance process for next week. Patient's daughter stated that she did not like any of the [employer] contracted facilities and they would not be selecting one. Velna HatchetSheila stated she will be at the hospital later today to sign the paperwork [facility] stating she is aware that patient will receive a bill.   CSW signing off.   Osborne Cascoadia Jatinder Mcdonagh LCSW [phone]

## 2017-09-18 NOTE — Progress Notes (Signed)
Pt without the right to appeal d/c 2/2 OBS status. Notice of discharge and HINN given by error. NCM explained to pt/pt's daughter. NCM discussed with daughter via phone with continued observation stay pt would be at risk in receiving a bill. Discussed and explained ABN, daughter stated will sign once she arrives on floor to visit with mom. Gae GallopAngela Felina Tello RN,BSN,CM

## 2017-09-19 DIAGNOSIS — R531 Weakness: Secondary | ICD-10-CM | POA: Diagnosis not present

## 2017-09-19 LAB — GLUCOSE, CAPILLARY
GLUCOSE-CAPILLARY: 151 mg/dL — AB (ref 70–99)
Glucose-Capillary: 180 mg/dL — ABNORMAL HIGH (ref 70–99)
Glucose-Capillary: 166 mg/dL — ABNORMAL HIGH (ref 70–99)
Glucose-Capillary: 185 mg/dL — ABNORMAL HIGH (ref 70–99)

## 2017-09-19 MED ORDER — DEXAMETHASONE 4 MG PO TABS
4.0000 mg | ORAL_TABLET | Freq: Four times a day (QID) | ORAL | Status: DC
  Administered 2017-09-19 – 2017-09-20 (×5): 4 mg via ORAL
  Filled 2017-09-19 (×6): qty 1

## 2017-09-19 NOTE — Progress Notes (Signed)
PROGRESS NOTE    Priscilla Tran  ZOX:[id number] DOB: 07/17/1935 DOA: 09/15/2017 PCP: Tracey Harries, MD   Brief Narrative:   82 year old female with history of diabetes mellitus type 2, essential hypertension, glaucoma of the right eye came to the hospital with complains of worsening of her lower extremity weakness.  Upon admission she had CT of the cervical spine which showed concerning for severe cervical stenosis with possible cord compression.  Neurosurgery was consulted who recommended holding aspirin Plavix for 5 days and will need surgical intervention on 09/21/2017.  Patient was evaluated physical therapy in the hospital who recommended skilled nursing facility but family and the patient refused to go there.  Otherwise patient has been cleared by neurosurgery to be discharged from the hospital and can follow-up outpatient on 09/21/2017.  Decadron 4 mg every 6 hours orally has been prescribed  Assessment & Plan:   Principal Problem:   Left-sided weakness Active Problems:   Glaucoma associated with ocular inflammation, severe stage   Hypertension   Diabetes type 2, controlled (HCC)   Cervical spinal cord compression (HCC)   Left-sided weakness especially lower extremity due to severe cervical stenosis; stable; improving  -Plans for surgical intervention by neurosurgery on 09/21/2017.  Continue to hold aspirin and Plavix until further instructed by them post operatively.  Advised by physical therapy who recommended skilled nursing facility with family is reluctant to go there at this time.  Home health has been offered to them in the meantime otherwise stable from neurosurgery site and can be discharged. - Reports her lower extremity weakness is slightly improving with Decadron.  We will continue Decadron 4 mg orally every 6 hours  Essential hypertension -Resume home medications.  History of glaucoma -Continue home meds   DVT prophylaxis: SCDs Code Status: Full  Family  Communication:  None at bedside  Disposition Plan: Patient has been discharged.  Physical therapy recommending skilled nursing facility with family is refusing to go to skilled nursing facility and does not want to take her home.  Consultants:   Neurosurgery  Procedures:   None   Antimicrobials:   None   Subjective: Feels a lot better.  Reports she is able to ambulate with less difficulty now  Review of Systems Otherwise negative except as per HPI, including: General = no fevers, chills, dizziness, malaise, fatigue HEENT/EYES = negative for pain, redness, loss of vision, double vision, blurred vision, loss of hearing, sore throat, hoarseness, dysphagia Cardiovascular= negative for chest pain, palpitation, murmurs, lower extremity swelling Respiratory/lungs= negative for shortness of breath, cough, hemoptysis, wheezing, mucus production Gastrointestinal= negative for nausea, vomiting,, abdominal pain, melena, hematemesis Genitourinary= negative for Dysuria, Hematuria, Change in Urinary Frequency MSK = Negative for arthralgia, myalgias, Back Pain, Joint swelling  Neurology= Negative for headache, seizures, numbness, tingling  Psychiatry= Negative for anxiety, depression, suicidal and homocidal ideation Allergy/Immunology= Medication/Food allergy as listed  Skin= Negative for Rash, lesions, ulcers, itching   Objective: Vitals:   09/18/17 1535 09/18/17 2033 09/19/17 0359 09/19/17 0933  BP: 129/60 110/74 (!) 147/64 (!) 146/65  Pulse: 70 64 (!) 57 67  Resp: 18 16 16    Temp: 98.4 F (36.9 C) 98.6 F (37 C) 98.3 F (36.8 C)   TempSrc: Oral Oral Oral   SpO2: 96% 97% 97%   Weight:  64.3 kg    Height:       No intake or output data in the 24 hours ending 09/19/17 1115 Filed Weights   09/16/17 0321 09/18/17 2033  Weight: 64.3  kg 64.3 kg    Examination:  Constitutional: NAD, calm, comfortable Eyes: PERRL, lids and conjunctivae normal ENMT: Mucous membranes are moist.  Posterior pharynx clear of any exudate or lesions.Normal dentition.  Neck: normal, supple, no masses, no thyromegaly Respiratory: clear to auscultation bilaterally, no wheezing, no crackles. Normal respiratory effort. No accessory muscle use.  Cardiovascular: Regular rate and rhythm, no murmurs / rubs / gallops. No extremity edema. 2+ pedal pulses. No carotid bruits.  Abdomen: no tenderness, no masses palpated. No hepatosplenomegaly. Bowel sounds positive.  Musculoskeletal: no clubbing / cyanosis. No joint deformity upper and lower extremities. Good ROM, no contractures. Normal muscle tone.  Skin: no rashes, lesions, ulcers. No induration Neurologic: CN 2-12 grossly intact. Sensation intact, DTR normal. Strength 4/5 in all 4.  Psychiatric: Normal judgment and insight. Alert and oriented x 3. Normal mood.   Data Reviewed:   CBC: Recent Labs  Lab 09/15/17 1531 09/15/17 1537  WBC 7.2  --   NEUTROABS 4.9  --   HGB 12.4 13.6  HCT 38.3 40.0  MCV 82.2  --   PLT 228  --    Basic Metabolic Panel: Recent Labs  Lab 09/15/17 1531 09/15/17 1537  NA 138 137  K 3.7 3.6  CL 101 98  CO2 26  --   GLUCOSE 161* 157*  BUN 11 12  CREATININE 0.62 0.50  CALCIUM 10.0  --    GFR: Estimated Creatinine Clearance: 51.3 mL/min (by C-G formula based on SCr of 0.5 mg/dL). Liver Function Tests: Recent Labs  Lab 09/15/17 1531  AST 21  ALT 12  ALKPHOS 66  BILITOT 0.8  PROT 7.7  ALBUMIN 4.5   No results for input(s): LIPASE, AMYLASE in the last 168 hours. No results for input(s): AMMONIA in the last 168 hours. Coagulation Profile: Recent Labs  Lab 09/15/17 1531  INR 0.96   Cardiac Enzymes: No results for input(s): CKTOTAL, CKMB, CKMBINDEX, TROPONINI in the last 168 hours. BNP (last 3 results) No results for input(s): PROBNP in the last 8760 hours. HbA1C: No results for input(s): HGBA1C in the last 72 hours. CBG: Recent Labs  Lab 09/18/17 0736 09/18/17 1155 09/18/17 1715  09/18/17 2035 09/19/17 0803  GLUCAP 165* 211* 186* 171* 151*   Lipid Profile: No results for input(s): CHOL, HDL, LDLCALC, TRIG, CHOLHDL, LDLDIRECT in the last 72 hours. Thyroid Function Tests: No results for input(s): TSH, T4TOTAL, FREET4, T3FREE, THYROIDAB in the last 72 hours. Anemia Panel: No results for input(s): VITAMINB12, FOLATE, FERRITIN, TIBC, IRON, RETICCTPCT in the last 72 hours. Sepsis Labs: No results for input(s): PROCALCITON, LATICACIDVEN in the last 168 hours.  No results found for this or any previous visit (from the past 240 hour(s)).     Radiology Studies: No results found.   Scheduled Meds: . amLODipine  10 mg Oral Daily  . carvedilol  12.5 mg Oral Q breakfast  . dexamethasone  4 mg Intravenous Q6H  . dorzolamide-timolol  1 drop Left Eye BID  . hydrochlorothiazide  25 mg Oral QODAY  . insulin aspart  0-5 Units Subcutaneous QHS  . insulin aspart  0-9 Units Subcutaneous TID WC  . levothyroxine  150 mcg Oral Q breakfast  . metFORMIN  1,000 mg Oral BID WC  . pravastatin  20 mg Oral q1800  . prednisoLONE acetate  1 drop Left Eye BID   Continuous Infusions:   LOS: 0 days    I have spent 15 minutes face to face with the patient and on the  ward discussing the patients care, assessment, plan and disposition with other care givers. >50% of the time was devoted counseling the patient about the risks and benefits of treatment and coordinating care.   Iyesha Such Joline Maxcyhirag Dwanna Goshert, MD [facility] Pager [phone]   If 7PM-7AM, please contact night-coverage [email or web address] Password [email or web address] 09/19/2017, 11:15 AM

## 2017-09-20 DIAGNOSIS — R531 Weakness: Secondary | ICD-10-CM | POA: Diagnosis not present

## 2017-09-20 LAB — GLUCOSE, CAPILLARY
GLUCOSE-CAPILLARY: 210 mg/dL — AB (ref 70–99)
GLUCOSE-CAPILLARY: 167 mg/dL — AB (ref 70–99)
Glucose-Capillary: 175 mg/dL — ABNORMAL HIGH (ref 70–99)
Glucose-Capillary: 199 mg/dL — ABNORMAL HIGH (ref 70–99)

## 2017-09-20 LAB — SURGICAL PCR SCREEN
MRSA, PCR: NEGATIVE
Staphylococcus aureus: POSITIVE — AB

## 2017-09-20 MED ORDER — CHLORHEXIDINE GLUCONATE CLOTH 2 % EX PADS: 6.0000 | MEDICATED_PAD | Freq: Once | CUTANEOUS | Status: DC

## 2017-09-20 MED ORDER — DEXAMETHASONE SODIUM PHOSPHATE 4 MG/ML IJ SOLN
4.0000 mg | Freq: Four times a day (QID) | INTRAMUSCULAR | Status: DC
  Administered 2017-09-20 – 2017-09-23 (×9): 4 mg via INTRAVENOUS
  Filled 2017-09-20 (×9): qty 1

## 2017-09-20 NOTE — Progress Notes (Signed)
PROGRESS NOTE    Asencion GowdaRita L Eble  ZOX:[id number] DOB: 1935-04-30 DOA: 09/15/2017 PCP: Tracey HarriesBouska, David, MD   Brief Narrative:   82 year old female with history of diabetes mellitus type 2, essential hypertension, glaucoma of the right eye came to the hospital with complains of worsening of her lower extremity weakness.  Upon admission she had CT of the cervical spine which showed concerning for severe cervical stenosis with possible cord compression.  Neurosurgery was consulted who recommended holding aspirin Plavix for 5 days and will need surgical intervention on 09/21/2017.  Patient was evaluated physical therapy in the hospital who recommended skilled nursing facility but family and the patient refused to go there.  Otherwise patient has been cleared by neurosurgery to be discharged from the hospital and can follow-up outpatient on 09/21/2017.  Decadron 4 mg every 6 hours orally has been prescribed  Assessment & Plan:   Principal Problem:   Left-sided weakness Active Problems:   Glaucoma associated with ocular inflammation, severe stage   Hypertension   Diabetes type 2, controlled (HCC)   Cervical spinal cord compression (HCC)   Left-sided weakness especially lower extremity due to severe cervical stenosis; stable; improving -Plans for surgical intervention by neurosurgery on 09/21/2017.  Continue to hold aspirin and Plavix.  Physical therapy recommended skilled nursing facility but family has been reluctant to take the patient there.  They did not feel comfortable taking her home with home health either.  She is medically cleared to be discharged to get the surgery done outpatient if necessary. -If she ends up staying in the hospital today, I have made her n.p.o. past midnight for intervention tomorrow while here. - Continue oral Decadron today, past midnight this can be switched to IV.  Essential hypertension -Resume home medications.  History of glaucoma -Continue home meds   DVT  prophylaxis: SCDs Code Status: Full  Family Communication:  None at bedside  Disposition Plan: Patient was already discharged couple of days ago but family does not want patient to go to skilled nursing facility and do not think patient is safe to come home and wanted to stay in the hospital.  If she is here again tonight we will keep her n.p.o. past midnight for surgery tomorrow.  Consultants:   Neurosurgery  Procedures:   None   Antimicrobials:   None   Subjective: No complaints, no acute events overnight.  States her weakness has been improving little every day.  Review of Systems Otherwise negative except as per HPI, including: General = no fevers, chills, dizziness, malaise, fatigue HEENT/EYES = negative for pain, redness, loss of vision, double vision, blurred vision, loss of hearing, sore throat, hoarseness, dysphagia Cardiovascular= negative for chest pain, palpitation, murmurs, lower extremity swelling Respiratory/lungs= negative for shortness of breath, cough, hemoptysis, wheezing, mucus production Gastrointestinal= negative for nausea, vomiting,, abdominal pain, melena, hematemesis Genitourinary= negative for Dysuria, Hematuria, Change in Urinary Frequency MSK = Negative for arthralgia, myalgias, Back Pain, Joint swelling  Neurology= Negative for headache, seizures, numbness, tingling  Psychiatry= Negative for anxiety, depression, suicidal and homocidal ideation Allergy/Immunology= Medication/Food allergy as listed  Skin= Negative for Rash, lesions, ulcers, itching   Objective: Vitals:   09/19/17 2155 09/20/17 0528 09/20/17 0902 09/20/17 1020  BP: 137/70 (!) 154/64 (!) 138/91   Pulse: 67 (!) 55 (!) 58 (!) 52  Resp: 17 17    Temp: 98.2 F (36.8 C) (!) 97.4 F (36.3 C)    TempSrc: Oral Oral    SpO2: 96% 98%    Weight:  Height:        Intake/Output Summary (Last 24 hours) at 09/20/2017 1100 Last data filed at 09/20/2017 0900 Gross per 24 hour  Intake  180 ml  Output -  Net 180 ml   Filed Weights   09/16/17 0321 09/18/17 2033  Weight: 64.3 kg 64.3 kg    Examination:  Constitutional: NAD, calm, comfortable Eyes: PERRL, lids and conjunctivae normal ENMT: Mucous membranes are moist. Posterior pharynx clear of any exudate or lesions.Normal dentition.  Neck: normal, supple, no masses, no thyromegaly Respiratory: clear to auscultation bilaterally, no wheezing, no crackles. Normal respiratory effort. No accessory muscle use.  Cardiovascular: Regular rate and rhythm, no murmurs / rubs / gallops. No extremity edema. 2+ pedal pulses. No carotid bruits.  Abdomen: no tenderness, no masses palpated. No hepatosplenomegaly. Bowel sounds positive.  Musculoskeletal: no clubbing / cyanosis. No joint deformity upper and lower extremities. Good ROM, no contractures. Normal muscle tone.  Skin: no rashes, lesions, ulcers. No induration Neurologic: CN 2-12 grossly intact. Sensation intact, DTR normal. Strength 4/5 in all 4.  Psychiatric: Normal judgment and insight. Alert and oriented x 3. Normal mood.    Data Reviewed:   CBC: Recent Labs  Lab 09/15/17 1531 09/15/17 1537  WBC 7.2  --   NEUTROABS 4.9  --   HGB 12.4 13.6  HCT 38.3 40.0  MCV 82.2  --   PLT 228  --    Basic Metabolic Panel: Recent Labs  Lab 09/15/17 1531 09/15/17 1537  NA 138 137  K 3.7 3.6  CL 101 98  CO2 26  --   GLUCOSE 161* 157*  BUN 11 12  CREATININE 0.62 0.50  CALCIUM 10.0  --    GFR: Estimated Creatinine Clearance: 51.3 mL/min (by C-G formula based on SCr of 0.5 mg/dL). Liver Function Tests: Recent Labs  Lab 09/15/17 1531  AST 21  ALT 12  ALKPHOS 66  BILITOT 0.8  PROT 7.7  ALBUMIN 4.5   No results for input(s): LIPASE, AMYLASE in the last 168 hours. No results for input(s): AMMONIA in the last 168 hours. Coagulation Profile: Recent Labs  Lab 09/15/17 1531  INR 0.96   Cardiac Enzymes: No results for input(s): CKTOTAL, CKMB, CKMBINDEX, TROPONINI  in the last 168 hours. BNP (last 3 results) No results for input(s): PROBNP in the last 8760 hours. HbA1C: No results for input(s): HGBA1C in the last 72 hours. CBG: Recent Labs  Lab 09/19/17 0803 09/19/17 1207 09/19/17 1708 09/19/17 2151 09/20/17 0754  GLUCAP 151* 180* 166* 185* 175*   Lipid Profile: No results for input(s): CHOL, HDL, LDLCALC, TRIG, CHOLHDL, LDLDIRECT in the last 72 hours. Thyroid Function Tests: No results for input(s): TSH, T4TOTAL, FREET4, T3FREE, THYROIDAB in the last 72 hours. Anemia Panel: No results for input(s): VITAMINB12, FOLATE, FERRITIN, TIBC, IRON, RETICCTPCT in the last 72 hours. Sepsis Labs: No results for input(s): PROCALCITON, LATICACIDVEN in the last 168 hours.  No results found for this or any previous visit (from the past 240 hour(s)).     Radiology Studies: No results found.   Scheduled Meds: . amLODipine  10 mg Oral Daily  . carvedilol  12.5 mg Oral Q breakfast  . dexamethasone  4 mg Oral Q6H  . dorzolamide-timolol  1 drop Left Eye BID  . hydrochlorothiazide  25 mg Oral QODAY  . insulin aspart  0-5 Units Subcutaneous QHS  . insulin aspart  0-9 Units Subcutaneous TID WC  . levothyroxine  150 mcg Oral Q breakfast  .  metFORMIN  1,000 mg Oral BID WC  . pravastatin  20 mg Oral q1800  . prednisoLONE acetate  1 drop Left Eye BID   Continuous Infusions:   LOS: 0 days    I have spent 18 minutes face to face with the patient and on the ward discussing the patients care, assessment, plan and disposition with other care givers. >50% of the time was devoted counseling the patient about the risks and benefits of treatment and coordinating care.   Melitza Metheny Joline Maxcy, MD [facility] Pager [phone]   If 7PM-7AM, please contact night-coverage [email or web address] Password [email or web address] 09/20/2017, 11:00 AM

## 2017-09-21 ENCOUNTER — Encounter (HOSPITAL_COMMUNITY): Payer: Self-pay | Admitting: Orthopedic Surgery

## 2017-09-21 ENCOUNTER — Observation Stay (HOSPITAL_COMMUNITY): Payer: Medicare HMO

## 2017-09-21 ENCOUNTER — Inpatient Hospital Stay (HOSPITAL_COMMUNITY)
Admission: EM | Disposition: A | Discharge status: Home Health | Payer: Self-pay | Source: Home / Self Care | Attending: Internal Medicine

## 2017-09-21 ENCOUNTER — Observation Stay (HOSPITAL_COMMUNITY): Payer: Medicare HMO | Admitting: Certified Registered Nurse Anesthetist

## 2017-09-21 DIAGNOSIS — R531 Weakness: Secondary | ICD-10-CM | POA: Diagnosis not present

## 2017-09-21 HISTORY — PX: ANTERIOR CERVICAL DECOMPRESSION/DISCECTOMY FUSION 4 LEVELS: SHX5556

## 2017-09-21 LAB — ABO/RH: ABO/RH(D): B POS

## 2017-09-21 LAB — POCT I-STAT 4, (NA,K, GLUC, HGB,HCT)
GLUCOSE: 144 mg/dL — AB (ref 70–99)
HEMATOCRIT: 41 % (ref 36.0–46.0)
Hemoglobin: 13.9 g/dL (ref 12.0–15.0)
POTASSIUM: 4.3 mmol/L (ref 3.5–5.1)
Sodium: 133 mmol/L — ABNORMAL LOW (ref 135–145)

## 2017-09-21 LAB — TYPE AND SCREEN
ABO/RH(D): B POS
Antibody Screen: NEGATIVE

## 2017-09-21 LAB — GLUCOSE, CAPILLARY
GLUCOSE-CAPILLARY: 191 mg/dL — AB (ref 70–99)
GLUCOSE-CAPILLARY: 178 mg/dL — AB (ref 70–99)
Glucose-Capillary: 130 mg/dL — ABNORMAL HIGH (ref 70–99)
Glucose-Capillary: 161 mg/dL — ABNORMAL HIGH (ref 70–99)

## 2017-09-21 SURGERY — ANTERIOR CERVICAL DECOMPRESSION/DISCECTOMY FUSION 4 LEVELS
Anesthesia: General | Site: Neck

## 2017-09-21 MED ORDER — THROMBIN 5000 UNITS EX SOLR
CUTANEOUS | Status: AC
  Filled 2017-09-21: qty 5000

## 2017-09-21 MED ORDER — HYDROMORPHONE HCL 1 MG/ML IJ SOLN: 1.0000 mg | INTRAMUSCULAR | Status: DC | PRN

## 2017-09-21 MED ORDER — FENTANYL CITRATE (PF) 250 MCG/5ML IJ SOLN
INTRAMUSCULAR | Status: AC
  Filled 2017-09-21: qty 5

## 2017-09-21 MED ORDER — OXYCODONE HCL 5 MG PO TABS: 5.0000 mg | ORAL_TABLET | Freq: Once | ORAL | Status: DC | PRN

## 2017-09-21 MED ORDER — THROMBIN 20000 UNITS EX SOLR
CUTANEOUS | Status: DC | PRN
  Administered 2017-09-21: 14:00:00 via TOPICAL

## 2017-09-21 MED ORDER — ONDANSETRON HCL 4 MG/2ML IJ SOLN
INTRAMUSCULAR | Status: DC | PRN
  Administered 2017-09-21: 4 mg via INTRAVENOUS

## 2017-09-21 MED ORDER — ROCURONIUM BROMIDE 50 MG/5ML IV SOSY
PREFILLED_SYRINGE | INTRAVENOUS | Status: AC
  Filled 2017-09-21: qty 5

## 2017-09-21 MED ORDER — ONDANSETRON HCL 4 MG/2ML IJ SOLN: 4.0000 mg | Freq: Four times a day (QID) | INTRAMUSCULAR | Status: DC | PRN

## 2017-09-21 MED ORDER — PROPOFOL 10 MG/ML IV BOLUS
INTRAVENOUS | Status: AC
  Filled 2017-09-21: qty 20

## 2017-09-21 MED ORDER — LIDOCAINE 2% (20 MG/ML) 5 ML SYRINGE
INTRAMUSCULAR | Status: AC
  Filled 2017-09-21: qty 5

## 2017-09-21 MED ORDER — CEFAZOLIN SODIUM-DEXTROSE 2-4 GM/100ML-% IV SOLN
INTRAVENOUS | Status: AC
  Filled 2017-09-21: qty 100

## 2017-09-21 MED ORDER — DEXAMETHASONE SODIUM PHOSPHATE 10 MG/ML IJ SOLN
INTRAMUSCULAR | Status: AC
  Administered 2017-09-21: 10 mg via INTRAVENOUS
  Filled 2017-09-21: qty 1

## 2017-09-21 MED ORDER — DEXAMETHASONE SODIUM PHOSPHATE 10 MG/ML IJ SOLN
10.0000 mg | INTRAMUSCULAR | Status: DC
  Administered 2017-09-21: 10 mg via INTRAVENOUS

## 2017-09-21 MED ORDER — SODIUM CHLORIDE 0.9 % IV SOLN
INTRAVENOUS | Status: DC | PRN
  Administered 2017-09-21: 14:00:00

## 2017-09-21 MED ORDER — PHENYLEPHRINE 40 MCG/ML (10ML) SYRINGE FOR IV PUSH (FOR BLOOD PRESSURE SUPPORT)
PREFILLED_SYRINGE | INTRAVENOUS | Status: DC | PRN
  Administered 2017-09-21: 80 ug via INTRAVENOUS

## 2017-09-21 MED ORDER — MENTHOL 3 MG MT LOZG: 1.0000 | LOZENGE | OROMUCOSAL | Status: DC | PRN

## 2017-09-21 MED ORDER — PROPOFOL 10 MG/ML IV BOLUS
INTRAVENOUS | Status: DC | PRN
  Administered 2017-09-21: 100 mg via INTRAVENOUS

## 2017-09-21 MED ORDER — CYCLOBENZAPRINE HCL 10 MG PO TABS
10.0000 mg | ORAL_TABLET | Freq: Three times a day (TID) | ORAL | Status: DC | PRN
  Administered 2017-09-21: 10 mg via ORAL
  Filled 2017-09-21: qty 1

## 2017-09-21 MED ORDER — THROMBIN 5000 UNITS EX SOLR
OROMUCOSAL | Status: DC | PRN
  Administered 2017-09-21: 15:00:00 via TOPICAL

## 2017-09-21 MED ORDER — LIDOCAINE 2% (20 MG/ML) 5 ML SYRINGE
INTRAMUSCULAR | Status: DC | PRN
  Administered 2017-09-21: 60 mg via INTRAVENOUS

## 2017-09-21 MED ORDER — LACTATED RINGERS IV SOLN
INTRAVENOUS | Status: DC | PRN
  Administered 2017-09-21: 13:00:00 via INTRAVENOUS

## 2017-09-21 MED ORDER — ONDANSETRON HCL 4 MG PO TABS
4.0000 mg | ORAL_TABLET | Freq: Four times a day (QID) | ORAL | Status: DC | PRN
  Administered 2017-09-21: 4 mg via ORAL
  Filled 2017-09-21: qty 1

## 2017-09-21 MED ORDER — ALBUMIN HUMAN 5 % IV SOLN
INTRAVENOUS | Status: DC | PRN
  Administered 2017-09-21: 16:00:00 via INTRAVENOUS

## 2017-09-21 MED ORDER — SODIUM CHLORIDE 0.9 % IV SOLN
250.0000 mL | INTRAVENOUS | Status: DC
  Administered 2017-09-21: 18:00:00 via INTRAVENOUS

## 2017-09-21 MED ORDER — HYDROCODONE-ACETAMINOPHEN 10-325 MG PO TABS: 2.0000 | ORAL_TABLET | ORAL | Status: DC | PRN

## 2017-09-21 MED ORDER — OXYCODONE HCL 5 MG/5ML PO SOLN: 5.0000 mg | Freq: Once | ORAL | Status: DC | PRN

## 2017-09-21 MED ORDER — SODIUM CHLORIDE 0.9% FLUSH
3.0000 mL | Freq: Two times a day (BID) | INTRAVENOUS | Status: DC
  Administered 2017-09-21 – 2017-09-23 (×3): 3 mL via INTRAVENOUS

## 2017-09-21 MED ORDER — 0.9 % SODIUM CHLORIDE (POUR BTL) OPTIME
TOPICAL | Status: DC | PRN
  Administered 2017-09-21: 1000 mL

## 2017-09-21 MED ORDER — THROMBIN (RECOMBINANT) 20000 UNITS EX SOLR
CUTANEOUS | Status: AC
  Filled 2017-09-21: qty 20000

## 2017-09-21 MED ORDER — HYDROCODONE-ACETAMINOPHEN 5-325 MG PO TABS
1.0000 | ORAL_TABLET | ORAL | Status: DC | PRN
  Administered 2017-09-21 (×2): 1 via ORAL
  Filled 2017-09-21 (×2): qty 1

## 2017-09-21 MED ORDER — FENTANYL CITRATE (PF) 250 MCG/5ML IJ SOLN
INTRAMUSCULAR | Status: DC | PRN
  Administered 2017-09-21: 25 ug via INTRAVENOUS
  Administered 2017-09-21: 50 ug via INTRAVENOUS
  Administered 2017-09-21: 100 ug via INTRAVENOUS
  Administered 2017-09-21: 150 ug via INTRAVENOUS

## 2017-09-21 MED ORDER — PHENOL 1.4 % MT LIQD: 1.0000 | OROMUCOSAL | Status: DC | PRN

## 2017-09-21 MED ORDER — ONDANSETRON HCL 4 MG/2ML IJ SOLN
INTRAMUSCULAR | Status: AC
  Filled 2017-09-21: qty 2

## 2017-09-21 MED ORDER — DEXAMETHASONE SODIUM PHOSPHATE 10 MG/ML IJ SOLN
INTRAMUSCULAR | Status: DC | PRN
  Administered 2017-09-21: 10 mg via INTRAVENOUS

## 2017-09-21 MED ORDER — SODIUM CHLORIDE 0.9% FLUSH: 3.0000 mL | INTRAVENOUS | Status: DC | PRN

## 2017-09-21 MED ORDER — PHENYLEPHRINE 40 MCG/ML (10ML) SYRINGE FOR IV PUSH (FOR BLOOD PRESSURE SUPPORT)
PREFILLED_SYRINGE | INTRAVENOUS | Status: AC
  Filled 2017-09-21: qty 10

## 2017-09-21 MED ORDER — SUGAMMADEX SODIUM 200 MG/2ML IV SOLN
INTRAVENOUS | Status: DC | PRN
  Administered 2017-09-21: 200 mg via INTRAVENOUS

## 2017-09-21 MED ORDER — CEFAZOLIN SODIUM-DEXTROSE 1-4 GM/50ML-% IV SOLN
1.0000 g | Freq: Three times a day (TID) | INTRAVENOUS | Status: AC
  Administered 2017-09-21 – 2017-09-22 (×2): 1 g via INTRAVENOUS
  Filled 2017-09-21 (×2): qty 50

## 2017-09-21 MED ORDER — CEFAZOLIN SODIUM-DEXTROSE 2-4 GM/100ML-% IV SOLN
2.0000 g | INTRAVENOUS | Status: DC
  Administered 2017-09-21: 13:00:00 via INTRAVENOUS

## 2017-09-21 MED ORDER — SODIUM CHLORIDE 0.9 % IV SOLN
INTRAVENOUS | Status: DC | PRN
  Administered 2017-09-21: 40 ug/min via INTRAVENOUS

## 2017-09-21 MED ORDER — FENTANYL CITRATE (PF) 100 MCG/2ML IJ SOLN: 25.0000 ug | INTRAMUSCULAR | Status: DC | PRN

## 2017-09-21 MED ORDER — EPHEDRINE SULFATE-NACL 50-0.9 MG/10ML-% IV SOSY
PREFILLED_SYRINGE | INTRAVENOUS | Status: DC | PRN
  Administered 2017-09-21 (×2): 5 mg via INTRAVENOUS

## 2017-09-21 MED ORDER — DEXAMETHASONE SODIUM PHOSPHATE 10 MG/ML IJ SOLN
INTRAMUSCULAR | Status: AC
  Filled 2017-09-21: qty 1

## 2017-09-21 MED ORDER — ROCURONIUM BROMIDE 10 MG/ML (PF) SYRINGE
PREFILLED_SYRINGE | INTRAVENOUS | Status: DC | PRN
  Administered 2017-09-21: 10 mg via INTRAVENOUS
  Administered 2017-09-21: 50 mg via INTRAVENOUS
  Administered 2017-09-21 (×2): 10 mg via INTRAVENOUS

## 2017-09-21 SURGICAL SUPPLY — 65 items
BAG DECANTER FOR FLEXI CONT (MISCELLANEOUS) ×2 IMPLANT
BENZOIN TINCTURE PRP APPL 2/3 (GAUZE/BANDAGES/DRESSINGS) ×2 IMPLANT
BIT DRILL 13 (BIT) ×1 IMPLANT
BUR MATCHSTICK NEURO 3.0 LAGG (BURR) ×2 IMPLANT
CANISTER SUCT 3000ML PPV (MISCELLANEOUS) ×2 IMPLANT
CARTRIDGE OIL MAESTRO DRILL (MISCELLANEOUS) ×1 IMPLANT
DERMABOND ADVANCED (GAUZE/BANDAGES/DRESSINGS) ×1
DERMABOND ADVANCED .7 DNX12 (GAUZE/BANDAGES/DRESSINGS) IMPLANT
DIFFUSER DRILL AIR PNEUMATIC (MISCELLANEOUS) ×2 IMPLANT
DRAPE C-ARM 42X72 X-RAY (DRAPES) ×4 IMPLANT
DRAPE LAPAROTOMY 100X72 PEDS (DRAPES) ×2 IMPLANT
DRAPE MICROSCOPE LEICA (MISCELLANEOUS) ×2 IMPLANT
DURAPREP 6ML APPLICATOR 50/CS (WOUND CARE) ×2 IMPLANT
ELECT COATED BLADE 2.86 ST (ELECTRODE) ×2 IMPLANT
ELECT REM PT RETURN 9FT ADLT (ELECTROSURGICAL) ×2
ELECTRODE REM PT RTRN 9FT ADLT (ELECTROSURGICAL) ×1 IMPLANT
GAUZE 4X4 16PLY RFD (DISPOSABLE) IMPLANT
GAUZE SPONGE 4X4 12PLY STRL (GAUZE/BANDAGES/DRESSINGS) ×2 IMPLANT
GLOVE BIO SURGEON STRL SZ 6.5 (GLOVE) ×1 IMPLANT
GLOVE BIOGEL PI IND STRL 6.5 (GLOVE) IMPLANT
GLOVE BIOGEL PI IND STRL 7.0 (GLOVE) IMPLANT
GLOVE BIOGEL PI IND STRL 8 (GLOVE) IMPLANT
GLOVE BIOGEL PI INDICATOR 6.5 (GLOVE) ×3
GLOVE BIOGEL PI INDICATOR 7.0 (GLOVE) ×1
GLOVE BIOGEL PI INDICATOR 8 (GLOVE) ×1
GLOVE ECLIPSE 7.5 STRL STRAW (GLOVE) ×1 IMPLANT
GLOVE ECLIPSE 9.0 STRL (GLOVE) ×3 IMPLANT
GLOVE EXAM NITRILE LRG STRL (GLOVE) IMPLANT
GLOVE EXAM NITRILE XL STR (GLOVE) IMPLANT
GLOVE EXAM NITRILE XS STR PU (GLOVE) IMPLANT
GLOVE SURG SS PI 6.0 STRL IVOR (GLOVE) ×4 IMPLANT
GOWN STRL REUS W/ TWL LRG LVL3 (GOWN DISPOSABLE) IMPLANT
GOWN STRL REUS W/ TWL XL LVL3 (GOWN DISPOSABLE) IMPLANT
GOWN STRL REUS W/TWL 2XL LVL3 (GOWN DISPOSABLE) IMPLANT
GOWN STRL REUS W/TWL LRG LVL3 (GOWN DISPOSABLE) ×3
GOWN STRL REUS W/TWL XL LVL3 (GOWN DISPOSABLE) ×2
HALTER HD/CHIN CERV TRACTION D (MISCELLANEOUS) ×2 IMPLANT
HEMOSTAT POWDER KIT SURGIFOAM (HEMOSTASIS) ×1 IMPLANT
KIT BASIN OR (CUSTOM PROCEDURE TRAY) ×2 IMPLANT
KIT TURNOVER KIT B (KITS) ×2 IMPLANT
NDL SPNL 20GX3.5 QUINCKE YW (NEEDLE) ×1 IMPLANT
NEEDLE SPNL 20GX3.5 QUINCKE YW (NEEDLE) ×2 IMPLANT
NS IRRIG 1000ML POUR BTL (IV SOLUTION) ×2 IMPLANT
OIL CARTRIDGE MAESTRO DRILL (MISCELLANEOUS) ×2
PACK LAMINECTOMY NEURO (CUSTOM PROCEDURE TRAY) ×2 IMPLANT
PAD ARMBOARD 7.5X6 YLW CONV (MISCELLANEOUS) ×6 IMPLANT
PLATE 4 75XNS SPNE CVD ANT T (Plate) IMPLANT
PLATE 4 ATLANTIS TRANS (Plate) ×1 IMPLANT
RUBBERBAND STERILE (MISCELLANEOUS) ×4 IMPLANT
SCREW ST 13X4XST VA NS SPNE (Screw) IMPLANT
SCREW ST FIX 4 ATL 3120213 (Screw) ×6 IMPLANT
SCREW ST VAR 4 ATL (Screw) ×4 IMPLANT
SPACER BONE CORNERSTONE 6X14 (Orthopedic Implant) ×4 IMPLANT
SPONGE INTESTINAL PEANUT (DISPOSABLE) ×2 IMPLANT
SPONGE SURGIFOAM ABS GEL 100 (HEMOSTASIS) ×2 IMPLANT
STRIP CLOSURE SKIN 1/2X4 (GAUZE/BANDAGES/DRESSINGS) ×2 IMPLANT
SUT VIC AB 3-0 SH 8-18 (SUTURE) ×2 IMPLANT
SUT VIC AB 4-0 RB1 18 (SUTURE) ×2 IMPLANT
TAPE CLOTH 4X10 WHT NS (GAUZE/BANDAGES/DRESSINGS) ×1 IMPLANT
TAPE CLOTH SURG 4X10 WHT LF (GAUZE/BANDAGES/DRESSINGS) ×1 IMPLANT
TOWEL GREEN STERILE (TOWEL DISPOSABLE) ×2 IMPLANT
TOWEL GREEN STERILE FF (TOWEL DISPOSABLE) ×2 IMPLANT
TRAP SPECIMEN MUCOUS 40CC (MISCELLANEOUS) ×2 IMPLANT
TRAY FOLEY MTR SLVR 16FR STAT (SET/KITS/TRAYS/PACK) ×1 IMPLANT
WATER STERILE IRR 1000ML POUR (IV SOLUTION) ×2 IMPLANT

## 2017-09-21 NOTE — Brief Op Note (Signed)
09/15/2017 - 09/21/2017  4:54 PM  PATIENT:  Priscilla Tran  82 y.o. female  PRE-OPERATIVE DIAGNOSIS:  Stenosis  POST-OPERATIVE DIAGNOSIS:  Stenosis  PROCEDURE:  Procedure(s): Cervical three-four, Cervical four-five, Cervical five-six, Cervical six-seven Anterior Cervical Decompression Fusion (N/A)  SURGEON:  Surgeon(s) and Role:    * Julio SicksPool, Ivis Nicolson, MD - Primary    * Shirlean KellyNudelman, Robert, MD - Assisting  PHYSICIAN ASSISTANT:   ASSISTANTS:    ANESTHESIA:   general  EBL:  300 mL   BLOOD ADMINISTERED:none  DRAINS: none   LOCAL MEDICATIONS USED:  NONE  SPECIMEN:  No Specimen  DISPOSITION OF SPECIMEN:  N/A  COUNTS:  YES  TOURNIQUET:  * No tourniquets in log *  DICTATION: .Dragon Dictation  PLAN OF CARE: Admit to inpatient   PATIENT DISPOSITION:  PACU - hemodynamically stable.   Delay start of Pharmacological VTE agent (>24hrs) due to surgical blood loss or risk of bleeding: yes

## 2017-09-21 NOTE — Anesthesia Preprocedure Evaluation (Signed)
Anesthesia Evaluation  Patient identified by MRN, date of birth, ID band Patient awake    Reviewed: Allergy & Precautions, H&P , NPO status , Patient's Chart, lab work & pertinent test results  Airway Mallampati: II   Neck ROM: full    Dental   Pulmonary neg pulmonary ROS,    breath sounds clear to auscultation       Cardiovascular hypertension,  Rhythm:regular Rate:Normal     Neuro/Psych    GI/Hepatic   Endo/Other  diabetes, Type 2Hypothyroidism   Renal/GU      Musculoskeletal  (+) Arthritis ,   Abdominal   Peds  Hematology   Anesthesia Other Findings   Reproductive/Obstetrics                             Anesthesia Physical Anesthesia Plan  ASA: II  Anesthesia Plan: General   Post-op Pain Management:    Induction: Intravenous  PONV Risk Score and Plan: 3 and Ondansetron, Dexamethasone and Treatment may vary due to age or medical condition  Airway Management Planned: Oral ETT  Additional Equipment:   Intra-op Plan:   Post-operative Plan: Extubation in OR  Informed Consent: I have reviewed the patients History and Physical, chart, labs and discussed the procedure including the risks, benefits and alternatives for the proposed anesthesia with the patient or authorized representative who has indicated his/her understanding and acceptance.     Plan Discussed with: Anesthesiologist, CRNA and Surgeon  Anesthesia Plan Comments:         Anesthesia Quick Evaluation

## 2017-09-21 NOTE — Progress Notes (Signed)
PT Cancellation Note  Patient Details Name: Priscilla GowdaRita L Trindade MRN: [id number] DOB: Nov 04, 1935   Cancelled Treatment:    Reason Eval/Treat Not Completed: Patient at procedure or test/unavailable. Pt currently in surgery. Will check back when appropriate.   Kallie LocksHannah Dovie Kapusta, PTA Pager [phone] Acute Rehab  [name] 09/21/2017, 1:31 PM

## 2017-09-21 NOTE — Transfer of Care (Signed)
Immediate Anesthesia Transfer of Care Note  Patient: Asencion GowdaRita L Sigal  Procedure(s) Performed: Cervical three-four, Cervical four-five, Cervical five-six, Cervical six-seven Anterior Cervical Decompression Fusion (N/A Neck)  Patient Location: PACU  Anesthesia Type:General  Level of Consciousness: awake and patient cooperative  Airway & Oxygen Therapy: Patient Spontanous Breathing and Patient connected to face mask oxygen  Post-op Assessment: Report given to RN, Post -op Vital signs reviewed and stable and Patient moving all extremities X 4  Post vital signs: Reviewed and stable  Last Vitals:  Vitals Value Taken Time  BP 149/76 09/21/2017  5:06 PM  Temp    Pulse 82 09/21/2017  5:09 PM  Resp 16 09/21/2017  5:09 PM  SpO2 99 % 09/21/2017  5:09 PM  Vitals shown include unvalidated device data.  Last Pain:  Vitals:   09/21/17 0800  TempSrc:   PainSc: 0-No pain         Complications: No apparent anesthesia complications

## 2017-09-21 NOTE — Anesthesia Procedure Notes (Signed)
Procedure Name: Intubation Date/Time: 09/21/2017 2:09 PM Performed by: Julian ReilWelty, Erinn Mendosa F, CRNA Pre-anesthesia Checklist: Patient identified, Emergency Drugs available, Suction available and Patient being monitored Patient Re-evaluated:Patient Re-evaluated prior to induction Oxygen Delivery Method: Circle system utilized Preoxygenation: Pre-oxygenation with 100% oxygen Induction Type: IV induction Ventilation: Mask ventilation without difficulty Laryngoscope Size: 3 and Miller Grade View: Grade I Tube type: Oral Tube size: 7.0 mm Number of attempts: 1 Airway Equipment and Method: Stylet Placement Confirmation: ETT inserted through vocal cords under direct vision,  positive ETCO2 and breath sounds checked- equal and bilateral Secured at: 22 cm Tube secured with: Tape Dental Injury: Teeth and Oropharynx as per pre-operative assessment  Comments: Patient positioned neck in extension prior to induction.  Minimal neck movement with DL. [id number] bite block used.

## 2017-09-21 NOTE — Op Note (Signed)
Date of procedure: 09/21/2017  Date of dictation: Same  Service: Neurosurgery  Preoperative diagnosis: Cervical stenosis with myelopathy  Postoperative diagnosis: Same  Procedure Name: C3-4, C4-5, C5-6, C6-7 anterior cervical discectomy with interbody fusion utilizing interbody allograft wedges and anterior plate instrumentation  Surgeon:Salome Cozby A.Lewie Deman, M.D.  Asst. Surgeon: Newell CoralNudelman  Anesthesia: General  Indication: 82 year old female with profound bilateral upper and lower extremity weakness left greater than right.  Work-up demonstrates evidence of critical multilevel spinal stenosis worse at the C5-6 level.  No history of trauma.  Patient has improved with rest and the steroids.  She presents now for 4 level anterior cervical decompression and fusion in hopes of improving her symptoms.  Operative note: After induction of anesthesia, patient position supine with neck slightly extended held place holder traction.  Patient's anterior cervical region prepped and draped sterilely.  Incision made overlying C5.  Dissection performed on the right.  Retractor placed.  Fluoroscopy used.  Levels confirmed.  Disc spaces incised at all 4 levels.  Anterior osteophytes removed.  Discectomy was performed using various instruments down to level the posterior annulus.  Starting first at the C5-6 level remaining aspects of disc were cleaned down to level the posterior annulus.  Microscope was then brought to the field used throughout the remainder of the discectomy.  Remaining aspects of annulus and osteophytes were then removed using high-speed drill down to level the posterior logical limb.  Posterior logical is not elevated and resected piecemeal fashion.  Underlying thecal sac was then identified.  Wide central decompression then performed undercutting the bodies of C5 and C6.  Decompression then proceeded into each neural foramen.  Wide anterior foraminotomies were then performed on the course exiting C6 nerve  roots.  At this point a very thorough decompression of been achieved.  There was no evidence of injury to the thecal sac or nerve roots.  Procedure was then repeated at C6-7, C4-5 and lastly at C3-4 again all without complications.  Wound was then irrigated fanlike solution.  Gelfoam placed topically for hemostasis then removed.  6 mm [employer] allograft wedges were then impacted into place and recessed slightly from the anterior cortical margin.  Atlantis translational anterior cervical plate was then placed over the C3-C7 levels.  This then attached under fluoroscopic guidance using 13mm screws.  All screws given final tightening found to be solidly in the bone.  Locking screws engaged at all 5 levels.  Final images reveal good position of the bone grafts and hardware at the proper operative level with normal alignment of spine.  Wound was inspected for hemostasis found to be good.  Wounds and closed in layers with Vicryl sutures.  Steri-Strips and sterile dressing were applied.  No apparent complications.  Patient tolerated the procedure well and she returns to the recovery room postop.

## 2017-09-21 NOTE — Progress Notes (Signed)
PROGRESS NOTE    Priscilla Tran  QIO:[id number] DOB: 11/25/1935 DOA: 09/15/2017 PCP: Tracey Harries, MD   Brief Narrative:   82 year old female with history of diabetes mellitus type 2, essential hypertension, glaucoma of the right eye came to the hospital with complains of worsening of her lower extremity weakness.  Upon admission she had CT of the cervical spine which showed concerning for severe cervical stenosis with possible cord compression.  Neurosurgery was consulted who recommended holding aspirin Plavix for 5 days and will need surgical intervention on 09/21/2017.  Patient was evaluated physical therapy in the hospital who recommended skilled nursing facility but family and the patient refused to go there.  Otherwise patient has been cleared by neurosurgery to be discharged from the hospital and can follow-up outpatient on 09/21/2017.  Decadron 4 mg every 6 hours orally has been prescribed  Assessment & Plan:   Principal Problem:   Left-sided weakness Active Problems:   Glaucoma associated with ocular inflammation, severe stage   Hypertension   Diabetes type 2, controlled (HCC)   Cervical spinal cord compression (HCC)   Left-sided weakness especially lower extremity due to severe cervical stenosis; stable; improving -Resume diet after surgery.  Should continue Decadron postop?,  Will defer this to neurosurgery. - Plans to the OR today by neuroSurgery. Will follow up post op Recs.   Essential hypertension -Resume home medications.  History of glaucoma -Continue home meds   DVT prophylaxis: SCDs Code Status: Full  Family Communication:  None at bedside  Disposition Plan: Plans for neurosurgery today.  Consultants:   Neurosurgery  Procedures:   None   Antimicrobials:   None   Subjective: No acute events overnight.  No complaints this morning, awaiting for her surgery.  Review of Systems Otherwise negative except as per HPI, including: General = no fevers,  chills, dizziness, malaise, fatigue HEENT/EYES = negative for pain, redness, loss of vision, double vision, blurred vision, loss of hearing, sore throat, hoarseness, dysphagia Cardiovascular= negative for chest pain, palpitation, murmurs, lower extremity swelling Respiratory/lungs= negative for shortness of breath, cough, hemoptysis, wheezing, mucus production Gastrointestinal= negative for nausea, vomiting,, abdominal pain, melena, hematemesis Genitourinary= negative for Dysuria, Hematuria, Change in Urinary Frequency MSK = Negative for arthralgia, myalgias, Back Pain, Joint swelling  Neurology= Negative for headache, seizures, numbness, tingling  Psychiatry= Negative for anxiety, depression, suicidal and homocidal ideation Allergy/Immunology= Medication/Food allergy as listed  Skin= Negative for Rash, lesions, ulcers, itching    Objective: Vitals:   09/20/17 1314 09/20/17 2118 09/21/17 0636 09/21/17 1250  BP: (!) 151/66 134/77 (!) 140/97   Pulse: 60 68 (!) 57   Resp: 18 17 17    Temp: 98.3 F (36.8 C) 98.3 F (36.8 C) 98.3 F (36.8 C)   TempSrc: Oral Oral Oral   SpO2: 99% 99% 99%   Weight:    64.3 kg  Height:    5\' 5"  (1.651 m)    Intake/Output Summary (Last 24 hours) at 09/21/2017 1503 Last data filed at 09/21/2017 1500 Gross per 24 hour  Intake -  Output 300 ml  Net -300 ml   Filed Weights   09/16/17 0321 09/18/17 2033 09/21/17 1250  Weight: 64.3 kg 64.3 kg 64.3 kg    Examination:  Constitutional: NAD, calm, comfortable Eyes: PERRL, lids and conjunctivae normal ENMT: Mucous membranes are moist. Posterior pharynx clear of any exudate or lesions.Normal dentition.  Neck: normal, supple, no masses, no thyromegaly Respiratory: clear to auscultation bilaterally, no wheezing, no crackles. Normal respiratory effort. No accessory  muscle use.  Cardiovascular: Regular rate and rhythm, no murmurs / rubs / gallops. No extremity edema. 2+ pedal pulses. No carotid bruits.    Abdomen: no tenderness, no masses palpated. No hepatosplenomegaly. Bowel sounds positive.  Musculoskeletal: no clubbing / cyanosis. No joint deformity upper and lower extremities. Good ROM, no contractures. Normal muscle tone.  Skin: no rashes, lesions, ulcers. No induration Neurologic: CN 2-12 grossly intact. Sensation intact, DTR normal. Strength 4/5 in all 4.  Psychiatric: Normal judgment and insight. Alert and oriented x 3. Normal mood.    Data Reviewed:   CBC: Recent Labs  Lab 09/15/17 1531 09/15/17 1537 09/21/17 1331  WBC 7.2  --   --   NEUTROABS 4.9  --   --   HGB 12.4 13.6 13.9  HCT 38.3 40.0 41.0  MCV 82.2  --   --   PLT 228  --   --    Basic Metabolic Panel: Recent Labs  Lab 09/15/17 1531 09/15/17 1537 09/21/17 1331  NA 138 137 133*  K 3.7 3.6 4.3  CL 101 98  --   CO2 26  --   --   GLUCOSE 161* 157* 144*  BUN 11 12  --   CREATININE 0.62 0.50  --   CALCIUM 10.0  --   --    GFR: Estimated Creatinine Clearance: 51.3 mL/min (by C-G formula based on SCr of 0.5 mg/dL). Liver Function Tests: Recent Labs  Lab 09/15/17 1531  AST 21  ALT 12  ALKPHOS 66  BILITOT 0.8  PROT 7.7  ALBUMIN 4.5   No results for input(s): LIPASE, AMYLASE in the last 168 hours. No results for input(s): AMMONIA in the last 168 hours. Coagulation Profile: Recent Labs  Lab 09/15/17 1531  INR 0.96   Cardiac Enzymes: No results for input(s): CKTOTAL, CKMB, CKMBINDEX, TROPONINI in the last 168 hours. BNP (last 3 results) No results for input(s): PROBNP in the last 8760 hours. HbA1C: No results for input(s): HGBA1C in the last 72 hours. CBG: Recent Labs  Lab 09/20/17 1155 09/20/17 1712 09/20/17 2115 09/21/17 0812 09/21/17 1142  GLUCAP 210* 167* 199* 191* 130*   Lipid Profile: No results for input(s): CHOL, HDL, LDLCALC, TRIG, CHOLHDL, LDLDIRECT in the last 72 hours. Thyroid Function Tests: No results for input(s): TSH, T4TOTAL, FREET4, T3FREE, THYROIDAB in the last 72  hours. Anemia Panel: No results for input(s): VITAMINB12, FOLATE, FERRITIN, TIBC, IRON, RETICCTPCT in the last 72 hours. Sepsis Labs: No results for input(s): PROCALCITON, LATICACIDVEN in the last 168 hours.  Recent Results (from the past 240 hour(s))  Surgical PCR screen     Status: Abnormal   Collection Time: 09/20/17 12:47 PM  Result Value Ref Range Status   MRSA, PCR NEGATIVE NEGATIVE Final   Staphylococcus aureus POSITIVE (A) NEGATIVE Final    Comment: (NOTE) The Xpert SA Assay (FDA approved for NASAL specimens in patients 222 years of age and older), is one component of a comprehensive surveillance program. It is not intended to diagnose infection nor to guide or monitor treatment. Performed at [facility], [address] [place], [place] [address]        Radiology Studies: No results found.   Scheduled Meds: . [MAR Hold] amLODipine  10 mg Oral Daily  . [MAR Hold] carvedilol  12.5 mg Oral Q breakfast  . Chlorhexidine Gluconate Cloth  6 each Topical Once   And  . Chlorhexidine Gluconate Cloth  6 each Topical Once  . dexamethasone      . [  START ON 09/22/2017] dexamethasone  10 mg Intravenous On Call to OR  . [MAR Hold] dexamethasone  4 mg Intravenous Q6H  . [MAR Hold] dorzolamide-timolol  1 drop Left Eye BID  . [MAR Hold] hydrochlorothiazide  25 mg Oral QODAY  . [MAR Hold] insulin aspart  0-5 Units Subcutaneous QHS  . [MAR Hold] insulin aspart  0-9 Units Subcutaneous TID WC  . [MAR Hold] levothyroxine  150 mcg Oral Q breakfast  . [MAR Hold] metFORMIN  1,000 mg Oral BID WC  . [MAR Hold] pravastatin  20 mg Oral q1800  . [MAR Hold] prednisoLONE acetate  1 drop Left Eye BID   Continuous Infusions: . ceFAZolin    . [START ON 09/22/2017]  ceFAZolin (ANCEF) IV       LOS: 0 days    I have spent 16 minutes face to face with the patient and on the ward discussing the patients care, assessment, plan and disposition with other care givers. >50% of the time was  devoted counseling the patient about the risks and benefits of treatment and coordinating care.   Morene Cecilio Joline Maxcy, MD [facility] Pager [phone]   If 7PM-7AM, please contact night-coverage [email or web address] Password [facility] 09/21/2017, 3:03 PM

## 2017-09-21 NOTE — Progress Notes (Signed)
Orthopedic Tech Progress Note Patient Details:  Priscilla GowdaRita L Tran 1935/11/28 [id number]  Ortho Devices Type of Ortho Device: Soft collar Ortho Device/Splint Location: neck Ortho Device/Splint Interventions: Application   Post Interventions Patient Tolerated: Well Instructions Provided: Care of device   Nikki DomCrawford, Darvin Dials 09/21/2017, 5:26 PM

## 2017-09-21 NOTE — Interval H&P Note (Signed)
History and Physical Interval Note:  09/21/2017 1:44 PM  Priscilla Tran  has presented today for surgery, with the diagnosis of Stenosis  The various methods of treatment have been discussed with the patient and family. After consideration of risks, benefits and other options for treatment, the patient has consented to  Procedure(s): C3-4, C4-5, C5-6 and C6-7 ACDF (N/A) as a surgical intervention .  The patient's history has been reviewed, patient examined, no change in status, stable for surgery.  I have reviewed the patient's chart and labs.  Questions were answered to the patient's satisfaction.     Kathaleen MaserHenry A Sorcha Rotunno

## 2017-09-22 ENCOUNTER — Ambulatory Visit: Payer: Medicare Other | Admitting: Adult Health

## 2017-09-22 DIAGNOSIS — Z888 Allergy status to other drugs, medicaments and biological substances status: Secondary | ICD-10-CM | POA: Diagnosis not present

## 2017-09-22 DIAGNOSIS — Z7984 Long term (current) use of oral hypoglycemic drugs: Secondary | ICD-10-CM | POA: Diagnosis not present

## 2017-09-22 DIAGNOSIS — E1136 Type 2 diabetes mellitus with diabetic cataract: Secondary | ICD-10-CM | POA: Diagnosis present

## 2017-09-22 DIAGNOSIS — Z7902 Long term (current) use of antithrombotics/antiplatelets: Secondary | ICD-10-CM | POA: Diagnosis not present

## 2017-09-22 DIAGNOSIS — G459 Transient cerebral ischemic attack, unspecified: Secondary | ICD-10-CM | POA: Diagnosis present

## 2017-09-22 DIAGNOSIS — E039 Hypothyroidism, unspecified: Secondary | ICD-10-CM | POA: Diagnosis present

## 2017-09-22 DIAGNOSIS — I10 Essential (primary) hypertension: Secondary | ICD-10-CM | POA: Diagnosis present

## 2017-09-22 DIAGNOSIS — R531 Weakness: Secondary | ICD-10-CM | POA: Diagnosis not present

## 2017-09-22 DIAGNOSIS — H5461 Unqualified visual loss, right eye, normal vision left eye: Secondary | ICD-10-CM | POA: Diagnosis present

## 2017-09-22 DIAGNOSIS — M2578 Osteophyte, vertebrae: Secondary | ICD-10-CM | POA: Diagnosis present

## 2017-09-22 DIAGNOSIS — Z7982 Long term (current) use of aspirin: Secondary | ICD-10-CM | POA: Diagnosis not present

## 2017-09-22 DIAGNOSIS — M4802 Spinal stenosis, cervical region: Secondary | ICD-10-CM | POA: Diagnosis present

## 2017-09-22 DIAGNOSIS — Z8249 Family history of ischemic heart disease and other diseases of the circulatory system: Secondary | ICD-10-CM | POA: Diagnosis not present

## 2017-09-22 DIAGNOSIS — Z79890 Hormone replacement therapy (postmenopausal): Secondary | ICD-10-CM | POA: Diagnosis not present

## 2017-09-22 DIAGNOSIS — M5136 Other intervertebral disc degeneration, lumbar region: Secondary | ICD-10-CM | POA: Diagnosis present

## 2017-09-22 DIAGNOSIS — G8194 Hemiplegia, unspecified affecting left nondominant side: Secondary | ICD-10-CM | POA: Diagnosis present

## 2017-09-22 DIAGNOSIS — G992 Myelopathy in diseases classified elsewhere: Secondary | ICD-10-CM | POA: Diagnosis present

## 2017-09-22 DIAGNOSIS — E785 Hyperlipidemia, unspecified: Secondary | ICD-10-CM | POA: Diagnosis present

## 2017-09-22 DIAGNOSIS — H409 Unspecified glaucoma: Secondary | ICD-10-CM | POA: Diagnosis present

## 2017-09-22 DIAGNOSIS — Z79899 Other long term (current) drug therapy: Secondary | ICD-10-CM | POA: Diagnosis not present

## 2017-09-22 LAB — GLUCOSE, CAPILLARY
GLUCOSE-CAPILLARY: 211 mg/dL — AB (ref 70–99)
GLUCOSE-CAPILLARY: 265 mg/dL — AB (ref 70–99)
GLUCOSE-CAPILLARY: 188 mg/dL — AB (ref 70–99)
GLUCOSE-CAPILLARY: 249 mg/dL — AB (ref 70–99)

## 2017-09-22 MED FILL — Thrombin (Recombinant) For Soln 20000 Unit: CUTANEOUS | Qty: 1 | Status: AC

## 2017-09-22 NOTE — Progress Notes (Signed)
Occupational Therapy Evaluation Patient Details Name: Priscilla Tran MRN: [id number] DOB: [birth year] Today's Date: 09/22/2017    History of Present Illness Pt is a 82 y.o. F with significant PMH of DM, HLD, HTN. Admitted 8/20 with 36 hours of left upper extremity and left lower extremity weakness. Has severe cervical stenosis secondary to multilevel spondylosis. Critical spinal cord compression at C5-6 and severe stenosis and cord compression at C3-4, C4-5, C6-7. Underwent C3-4, C4-5, C5-6, C6-7 anterior cervical discectomy with interbody fusion utilizing interbody allograft wedges and anterior plate instrumentation 8/26.    Clinical Impression   PTA, pt "very independent" with ADL and mobility and [facility]. Pt currently requires min A for mobility and ADL and RW level. VC for cervical precautions. Feel pt will be able to DC home with 24/7 S and HHOT/ HH Aide. Clarified brace orders with DR. Pool who states pt may remove brace when in chair/bed and for night time trips to the bathroom. Will follow acutely to facilitate safe DC home. Daughter present for session.     Follow Up Recommendations  Home health OT;Supervision/Assistance - 24 hour    Equipment Recommendations  3 in 1 bedside commode;Tub/shower bench    Recommendations for Other Services PT consult     Precautions / Restrictions Precautions Precautions: Fall;Cervical Required Braces or Orthoses: Cervical Brace Cervical Brace: Soft collar;Other (comment)(can remove when sitting in bed/chair; at night for trips to bathroom)      Mobility Bed Mobility Overal bed mobility: Needs Assistance Bed Mobility: Rolling;Sidelying to Sit Rolling: Supervision Sidelying to sit: Min guard       General bed mobility comments: vc for correct sequencing  Transfers Overall transfer level: Needs assistance Equipment used: Rolling walker (2 wheeled) Transfers: Sit to/from Stand Sit to Stand: Min guard Stand  pivot transfers: Min guard       General transfer comment: vc for precautions; hand placement    Balance     Sitting balance-Leahy Scale: Good       Standing balance-Leahy Scale: Fair(able to release B hands from RW for pericare)                             ADL either performed or assessed with clinical judgement   ADL Overall ADL's : Needs assistance/impaired Eating/Feeding: Supervision/ safety;Set up;Sitting   Grooming: Minimal assistance;Standing   Upper Body Bathing: Supervision/ safety;Set up;Sitting   Lower Body Bathing: Minimal assistance;Sit to/from stand   Upper Body Dressing : Minimal assistance;Sitting   Lower Body Dressing: Minimal assistance;Sit to/from stand   Toilet Transfer: Minimal assistance;RW;BSC(over toilet)   Toileting- Clothing Manipulation and Hygiene: Minimal assistance;Sit to/from stand Toileting - Clothing Manipulation Details (indicate cue type and reason): mild unsteadiness but good insight into safety     Functional mobility during ADLs: Minimal assistance;Rolling walker;Cueing for sequencing;Cueing for safety General ADL Comments: continued vc for cervical precautions during ADL and mobility     Vision Baseline Vision/History: Legally blind;Wears glasses(R eye; vision impaired L)       Perception     Praxis      Pertinent Vitals/Pain Pain Assessment: Faces Faces Pain Scale: Hurts a little bit Pain Location: with swallowing Pain Descriptors / Indicators: Discomfort;Guarding;Grimacing Pain Intervention(s): Limited activity within patient's tolerance     Hand Dominance Right   Extremity/Trunk Assessment Upper Extremity Assessment Upper Extremity Assessment: Generalized weakness;RUE deficits/detail;LUE deficits/detail RUE Deficits / Details: generalized weakness throughout but using BUE functionally  RUE Coordination: decreased fine motor LUE Deficits / Details: generalized weakness LUE Coordination: decreased fine  motor   Lower Extremity Assessment Lower Extremity Assessment: Defer to PT evaluation   Cervical / Trunk Assessment Cervical / Trunk Assessment: Kyphotic;Other exceptions(cervical surgery)   Communication Communication Communication: HOH   Cognition Arousal/Alertness: Awake/alert Behavior During Therapy: WFL for tasks assessed/performed Overall Cognitive Status: Within Functional Limits for tasks assessed                                 General Comments: Somewhat decreased insight into deficits and slightly impulsive   General Comments       Exercises     Shoulder Instructions      Home Living Family/patient expects to be discharged to:: Private residence Living Arrangements: Children Available Help at Discharge: Family;Available 24 hours/day Type of Home: House Home Access: Level entry     Home Layout: One level     Bathroom Shower/Tub: Tub/shower unit;Curtain   Bathroom Toilet: Handicapped height Bathroom Accessibility: Yes How Accessible: Accessible via walker Home Equipment: Walker - standard;Cane - single point          Prior Functioning/Environment Level of Independence: Independent with assistive device(s)        Comments: Uses SPC for outdoor/community ambulatinon        OT Problem List: Decreased strength;Impaired balance (sitting and/or standing);Decreased coordination;Decreased safety awareness;Decreased knowledge of use of DME or AE;Decreased knowledge of precautions;Impaired sensation;Impaired UE functional use;Pain      OT Treatment/Interventions: Self-care/ADL training;Therapeutic exercise;DME and/or AE instruction;Therapeutic activities;Patient/family education;Balance training    OT Goals(Current goals can be found in the care plan section) Acute Rehab OT Goals Patient Stated Goal: to be independent  OT Goal Formulation: With patient Time For Goal Achievement: 10/06/17 Potential to Achieve Goals: Good  OT Frequency: Min  3X/week   Barriers to D/C:            Co-evaluation              AM-PAC PT "6 Clicks" Daily Activity     Outcome Measure Help from another person eating meals?: A Little Help from another person taking care of personal grooming?: A Little Help from another person toileting, which includes using toliet, bedpan, or urinal?: A Little Help from another person bathing (including washing, rinsing, drying)?: A Little Help from another person to put on and taking off regular upper body clothing?: A Little Help from another person to put on and taking off regular lower body clothing?: A Little 6 Click Score: 18   End of Session Equipment Utilized During Treatment: Gait belt;Rolling walker;Cervical collar Nurse Communication: Mobility status  Activity Tolerance: Patient tolerated treatment well Patient left: in chair;with call bell/phone within reach;with chair alarm set;with family/visitor present  OT Visit Diagnosis: Unsteadiness on feet (R26.81);Other abnormalities of gait and mobility (R26.89);Muscle weakness (generalized) (M62.81);Pain Pain - part of body: (neck)                Time: 1610-9604 OT Time Calculation (min): 35 min Charges:  OT General Charges $OT Visit: 1 Visit OT Evaluation $OT Eval Moderate Complexity: 1 Mod OT Treatments $Self Care/Home Management : 8-22 mins  Luisa Dago, OT/L  OT Clinical Specialist [phone]   [facility] 09/22/2017, 10:05 AM

## 2017-09-22 NOTE — Anesthesia Postprocedure Evaluation (Signed)
Anesthesia Post Note  Patient: Priscilla Tran  Procedure(s) Performed: Cervical three-four, Cervical four-five, Cervical five-six, Cervical six-seven Anterior Cervical Decompression Fusion (N/A Neck)     Patient location during evaluation: PACU Anesthesia Type: General Level of consciousness: awake and alert Pain management: pain level controlled Vital Signs Assessment: post-procedure vital signs reviewed and stable Respiratory status: spontaneous breathing, nonlabored ventilation, respiratory function stable and patient connected to nasal cannula oxygen Cardiovascular status: blood pressure returned to baseline and stable Postop Assessment: no apparent nausea or vomiting Anesthetic complications: no    Last Vitals:  Vitals:   09/22/17 0422 09/22/17 0805  BP: (!) 159/64 (!) 147/64  Pulse: (!) 55 (!) 57  Resp: 17 16  Temp: 36.4 C 36.9 C  SpO2: 97% 96%    Last Pain:  Vitals:   09/22/17 0805  TempSrc:   PainSc: 6                  Priscilla Tran S

## 2017-09-22 NOTE — Progress Notes (Signed)
Postop day 1.  Patient status post 4 level anterior cervical decompression and fusion yesterday.  Overall doing well.  Minimal pain.  Upper extremity and lower extremity strength sensation improved.  Swallowing well.  Voice strong.  Afebrile.  Vital signs are stable.  Awake and alert.  Oriented and appropriate.  Motor and sensory function intact bilaterally.  Wound clean and dry.  Neck soft.  Airway midline.  Overall doing well following anterior cervical decompression and fusion surgery.  Continue mobilization today.  I think patient could be discharged home tomorrow with home physical and occupational therapy support.  She may resume her aspirin.  She will need to be off Plavix for at least 2 weeks postop.

## 2017-09-22 NOTE — Progress Notes (Signed)
PROGRESS NOTE    Priscilla GowdaRita L Tran  NWG:[id number] DOB: 1935/10/08 DOA: 09/15/2017 PCP: Tracey HarriesBouska, David, MD   Brief Narrative:   82 year old female with history of diabetes mellitus type 2, essential hypertension, glaucoma of the right eye came to the hospital with complains of worsening of her lower extremity weakness.  Upon admission she had CT of the cervical spine which showed concerning for severe cervical stenosis with possible cord compression.  Neurosurgery was consulted who recommended holding aspirin Plavix for 5 days and will need surgical intervention on 09/21/2017.  Patient was evaluated physical therapy in the hospital who recommended skilled nursing facility but family and the patient refused to go there.  Patient ended up having C spine discectomy C3-C7 on 09/21/17 which she tolerated the procedure well.   Assessment & Plan:   Principal Problem:   Left-sided weakness Active Problems:   Glaucoma associated with ocular inflammation, severe stage   Hypertension   Diabetes type 2, controlled (HCC)   Cervical spinal cord compression (HCC)   Left-sided weakness especially lower extremity due to severe cervical stenosis; stable; improving S/p C3-C7 anterior Cervical Discectomy with interbody fusion utilizing interbody allograft and anterior place instrucmentation.  - Patient is currently postop.  I spoke with Dr. [facility]al[facility]'s secretary who told the patient will be seen today by their service for their recommendations.  Unsure if patient needed steroids?.  We will also need recommendations in terms of her activity and follow-up care.  Essential hypertension -Resume home medications.  History of glaucoma -Continue home meds   DVT prophylaxis: SCDs Code Status: Full  Family Communication:  None at bedside  Disposition Plan: Currently awaiting neurosurgery input  Consultants:   Neurosurgery  Procedures:   None   Antimicrobials:   None   Subjective: No acute events  overnight. Tolerated the procedure well yesterday. Feels like her neck is sore. Also think she has some mild difficulty hearing from her left ear.   Review of Systems Otherwise negative except as per HPI, including: General = no fevers, chills, dizziness, malaise, fatigue HEENT/EYES = negative for pain, redness, loss of vision, double vision, blurred vision, loss of hearing, sore throat, hoarseness, dysphagia Cardiovascular= negative for chest pain, palpitation, murmurs, lower extremity swelling Respiratory/lungs= negative for shortness of breath, cough, hemoptysis, wheezing, mucus production Gastrointestinal= negative for nausea, vomiting,, abdominal pain, melena, hematemesis Genitourinary= negative for Dysuria, Hematuria, Change in Urinary Frequency MSK = Negative for arthralgia, myalgias, Back Pain, Joint swelling  Neurology= Negative for headache, seizures, numbness, tingling  Psychiatry= Negative for anxiety, depression, suicidal and homocidal ideation Allergy/Immunology= Medication/Food allergy as listed  Skin= Negative for Rash, lesions, ulcers, itching   Objective: Vitals:   09/21/17 2125 09/22/17 0002 09/22/17 0422 09/22/17 0805  BP: 127/62 136/65 (!) 159/64 (!) 147/64  Pulse: (!) 56 65 (!) 55 (!) 57  Resp: 18 17 17 16   Temp: 98.4 F (36.9 C)  97.6 F (36.4 C) 98.5 F (36.9 C)  TempSrc: Axillary  Oral   SpO2: 97% 97% 97% 96%  Weight:      Height:        Intake/Output Summary (Last 24 hours) at 09/22/2017 0953 Last data filed at 09/22/2017 0846 Gross per 24 hour  Intake 1938.22 ml  Output 1995 ml  Net -56.78 ml   Filed Weights   09/16/17 0321 09/18/17 2033 09/21/17 1250  Weight: 64.3 kg 64.3 kg 64.3 kg    Examination:  Constitutional: NAD, calm, comfortable; C collar in place Eyes: PERRL, lids and conjunctivae normal  ENMT: Mucous membranes are moist. Posterior pharynx clear of any exudate or lesions.Normal dentition.  Neck: normal, supple, no masses, no  thyromegaly Respiratory: clear to auscultation bilaterally, no wheezing, no crackles. Normal respiratory effort. No accessory muscle use.  Cardiovascular: Regular rate and rhythm, no murmurs / rubs / gallops. No extremity edema. 2+ pedal pulses. No carotid bruits.  Abdomen: no tenderness, no masses palpated. No hepatosplenomegaly. Bowel sounds positive.  Musculoskeletal: no clubbing / cyanosis. No joint deformity upper and lower extremities. Good ROM, no contractures. Normal muscle tone.  Skin: no rashes, lesions, ulcers. No induration Neurologic: CN 2-12 grossly intact. Sensation intact, DTR normal. Strength 4/5 in all 4.  Psychiatric: Normal judgment and insight. Alert and oriented x 3. Normal mood.    Data Reviewed:   CBC: Recent Labs  Lab 09/15/17 1531 09/15/17 1537 09/21/17 1331  WBC 7.2  --   --   NEUTROABS 4.9  --   --   HGB 12.4 13.6 13.9  HCT 38.3 40.0 41.0  MCV 82.2  --   --   PLT 228  --   --    Basic Metabolic Panel: Recent Labs  Lab 09/15/17 1531 09/15/17 1537 09/21/17 1331  NA 138 137 133*  K 3.7 3.6 4.3  CL 101 98  --   CO2 26  --   --   GLUCOSE 161* 157* 144*  BUN 11 12  --   CREATININE 0.62 0.50  --   CALCIUM 10.0  --   --    GFR: Estimated Creatinine Clearance: 51.3 mL/min (by C-G formula based on SCr of 0.5 mg/dL). Liver Function Tests: Recent Labs  Lab 09/15/17 1531  AST 21  ALT 12  ALKPHOS 66  BILITOT 0.8  PROT 7.7  ALBUMIN 4.5   No results for input(s): LIPASE, AMYLASE in the last 168 hours. No results for input(s): AMMONIA in the last 168 hours. Coagulation Profile: Recent Labs  Lab 09/15/17 1531  INR 0.96   Cardiac Enzymes: No results for input(s): CKTOTAL, CKMB, CKMBINDEX, TROPONINI in the last 168 hours. BNP (last 3 results) No results for input(s): PROBNP in the last 8760 hours. HbA1C: No results for input(s): HGBA1C in the last 72 hours. CBG: Recent Labs  Lab 09/21/17 0812 09/21/17 1142 09/21/17 1709 09/21/17 2121  09/22/17 0755  GLUCAP 191* 130* 178* 161* 211*   Lipid Profile: No results for input(s): CHOL, HDL, LDLCALC, TRIG, CHOLHDL, LDLDIRECT in the last 72 hours. Thyroid Function Tests: No results for input(s): TSH, T4TOTAL, FREET4, T3FREE, THYROIDAB in the last 72 hours. Anemia Panel: No results for input(s): VITAMINB12, FOLATE, FERRITIN, TIBC, IRON, RETICCTPCT in the last 72 hours. Sepsis Labs: No results for input(s): PROCALCITON, LATICACIDVEN in the last 168 hours.  Recent Results (from the past 240 hour(s))  Surgical PCR screen     Status: Abnormal   Collection Time: 09/20/17 12:47 PM  Result Value Ref Range Status   MRSA, PCR NEGATIVE NEGATIVE Final   Staphylococcus aureus POSITIVE (A) NEGATIVE Final    Comment: (NOTE) The Xpert SA Assay (FDA approved for NASAL specimens in patients 5 years of age and older), is one component of a comprehensive surveillance program. It is not intended to diagnose infection nor to guide or monitor treatment. Performed at [facility], [address] [place], [place] [address]        Radiology Studies: Dg Cervical Spine 1 View  Result Date: 09/21/2017 CLINICAL DATA:  82 year old female undergoing C3-C7 anterior cervical decompression and  fusion EXAM: DG C-ARM 61-120 MIN; DG CERVICAL SPINE - 1 VIEW COMPARISON:  Preoperative MRI 09/16/2017 FINDINGS: A total of 4 intraoperative spot images are submitted for review. The images demonstrate in progress C3-C7 anterior cervical discectomy and fusion with a vertebral body screw and plate construct. Interbody grafts are present at C3-C4, C4-C5, C5-C6 and C6-C7. No evidence of immediate hardware complication. IMPRESSION: In progress C3-C7 ACDF as above. Electronically Signed   By: Malachy Moan M.D.   On: 09/21/2017 16:54   Dg C-arm 1-60 Min  Result Date: 09/21/2017 CLINICAL DATA:  82 year old female undergoing C3-C7 anterior cervical decompression and fusion EXAM: DG C-ARM 61-120 MIN; DG  CERVICAL SPINE - 1 VIEW COMPARISON:  Preoperative MRI 09/16/2017 FINDINGS: A total of 4 intraoperative spot images are submitted for review. The images demonstrate in progress C3-C7 anterior cervical discectomy and fusion with a vertebral body screw and plate construct. Interbody grafts are present at C3-C4, C4-C5, C5-C6 and C6-C7. No evidence of immediate hardware complication. IMPRESSION: In progress C3-C7 ACDF as above. Electronically Signed   By: Malachy Moan M.D.   On: 09/21/2017 16:54     Scheduled Meds: . amLODipine  10 mg Oral Daily  . carvedilol  12.5 mg Oral Q breakfast  . dexamethasone  4 mg Intravenous Q6H  . dorzolamide-timolol  1 drop Left Eye BID  . hydrochlorothiazide  25 mg Oral QODAY  . insulin aspart  0-5 Units Subcutaneous QHS  . insulin aspart  0-9 Units Subcutaneous TID WC  . levothyroxine  150 mcg Oral Q breakfast  . metFORMIN  1,000 mg Oral BID WC  . pravastatin  20 mg Oral q1800  . prednisoLONE acetate  1 drop Left Eye BID  . sodium chloride flush  3 mL Intravenous Q12H   Continuous Infusions: . sodium chloride 100 mL/hr at 09/21/17 1822     LOS: 0 days    I have spent 19 minutes face to face with the patient and on the ward discussing the patients care, assessment, plan and disposition with other care givers. >50% of the time was devoted counseling the patient about the risks and benefits of treatment and coordinating care.   Latorsha Curling Joline Maxcy, MD [facility] Pager [phone]   If 7PM-7AM, please contact night-coverage [email or web address] Password [facility] 09/22/2017, 9:53 AM

## 2017-09-22 NOTE — Progress Notes (Signed)
Physical Therapy Treatment Patient Details Name: Priscilla GowdaRita L Tran MRN: [id number] DOB: 10/18/1935 Today's Date: 09/22/2017    History of Present Illness Pt is a 82 y.o. F with significant PMH of DM, HLD, HTN. Admitted 8/20 with 36 hours of left upper extremity and left lower extremity weakness. Has severe cervical stenosis secondary to multilevel spondylosis. Critical spinal cord compression at C5-6 and severe stenosis and cord compression at C3-4, C4-5, C6-7. Underwent C3-4, C4-5, C5-6, C6-7 anterior cervical discectomy with interbody fusion utilizing interbody allograft wedges and anterior plate instrumentation 8/26.     PT Comments    Pt presented in bed with a family member at bedside. Pt required multimodal cues in order to maintain precautions with log roll sequence and throughout session as pt had a difficult time avoiding twisting with functional activity. Educated pt and daughter in car transfers, generalized walking program, and positioning. Recommending RW and 24 hour supervision at d/c for safety and to maintain precautions. Pt would benefit from another PT session prior to d/c. Will continue to follow acutely per POC.    Follow Up Recommendations  Home health PT;Supervision/Assistance - 24 hour     Equipment Recommendations  Rolling walker with 5" wheels;3in1 (PT)    Recommendations for Other Services       Precautions / Restrictions Precautions Precautions: Fall;Cervical Precaution Booklet Issued: Yes (comment) Precaution Comments: Pt requires multiple cues in order to maintain precautions  Required Braces or Orthoses: Cervical Brace Cervical Brace: Soft collar Restrictions Weight Bearing Restrictions: No    Mobility  Bed Mobility Overal bed mobility: Needs Assistance Bed Mobility: Rolling;Sidelying to Sit Rolling: Min guard Sidelying to sit: Min assist       General bed mobility comments: Multimodal cues requierd for log roll sequence; min A required to maintain  precautions for supine> sit and to assist with leg elevation sit>supine. Pt slightly impulsive to get out of bed with HOB elevated, but lowered HOB and bedrails in order to simulate bedroom environment.  Transfers Overall transfer level: Needs assistance Equipment used: Rolling walker (2 wheeled) Transfers: Sit to/from Stand Sit to Stand: Min guard         General transfer comment: Min G for safety; increased time noted for powering up  Ambulation/Gait Ambulation/Gait assistance: Min guard Gait Distance (Feet): 200 Feet Assistive device: Rolling walker (2 wheeled) Gait Pattern/deviations: Step-to pattern;Step-through pattern;Decreased dorsiflexion - left;Decreased stance time - left Gait velocity: decreased Gait velocity interpretation: <1.31 ft/sec, indicative of household ambulator General Gait Details: min cues for upright posture and sequencing RW in order to achieve step-through gait. Cues required in order to maintain precautions with gait as pt was eager to twist to look at environment and engage with others   Stairs             Wheelchair Mobility    Modified Rankin (Stroke Patients Only)       Balance Overall balance assessment: Needs assistance Sitting-balance support: No upper extremity supported;Feet supported Sitting balance-Leahy Scale: Good     Standing balance support: Single extremity supported;Bilateral upper extremity supported;During functional activity Standing balance-Leahy Scale: Fair Standing balance comment: At least 1 UE support required for static standing; Bil UE support required for dynamic standing activitie                            Cognition Arousal/Alertness: Awake/alert Behavior During Therapy: WFL for tasks assessed/performed Overall Cognitive Status: Within Functional Limits for tasks assessed  Exercises      General Comments General comments (skin  integrity, edema, etc.): Daughter present and engaged in session; pt is [facility]      Pertinent Vitals/Pain Pain Assessment: Faces Faces Pain Scale: Hurts little more Pain Location: neck Pain Descriptors / Indicators: Discomfort;Grimacing;Sore Pain Intervention(s): Limited activity within patient's tolerance;Monitored during session    Home Living                      Prior Function            PT Goals (current goals can now be found in the care plan section) Acute Rehab PT Goals Patient Stated Goal: to be independent  PT Goal Formulation: With patient/family Time For Goal Achievement: 09/30/17 Potential to Achieve Goals: Good Progress towards PT goals: Progressing toward goals    Frequency    Min 5X/week      PT Plan Current plan remains appropriate    Co-evaluation              AM-PAC PT "6 Clicks" Daily Activity  Outcome Measure  Difficulty turning over in bed (including adjusting bedclothes, sheets and blankets)?: A Lot Difficulty moving from lying on back to sitting on the side of the bed? : Unable Difficulty sitting down on and standing up from a chair with arms (e.g., wheelchair, bedside commode, etc,.)?: Unable Help needed moving to and from a bed to chair (including a wheelchair)?: A Little Help needed walking in hospital room?: A Little Help needed climbing 3-5 steps with a railing? : A Lot 6 Click Score: 12    End of Session Equipment Utilized During Treatment: Gait belt;Cervical collar Activity Tolerance: Patient tolerated treatment well Patient left: in bed;with call bell/phone within reach;with family/visitor present Nurse Communication: Mobility status PT Visit Diagnosis: Other abnormalities of gait and mobility (R26.89);Muscle weakness (generalized) (M62.81);Pain Pain - part of body: (neck)     Time: 9604-5409 PT Time Calculation (min) (ACUTE ONLY): 26 min  Charges:  $Gait Training: 23-37 mins                     Donzetta Kohut,  [place]  Student Physical Therapist Acute Rehab [phone]    Donzetta Kohut 09/22/2017, 2:58 PM

## 2017-09-23 LAB — GLUCOSE, CAPILLARY
GLUCOSE-CAPILLARY: 218 mg/dL — AB (ref 70–99)
Glucose-Capillary: 252 mg/dL — ABNORMAL HIGH (ref 70–99)

## 2017-09-23 MED ORDER — CLOPIDOGREL BISULFATE 75 MG PO TABS: 75.0000 mg | ORAL_TABLET | Freq: Every day | ORAL | 1 refills | Status: DC

## 2017-09-23 MED ORDER — CARVEDILOL 12.5 MG PO TABS: 12.5000 mg | ORAL_TABLET | Freq: Two times a day (BID) | ORAL | 1 refills | Status: AC

## 2017-09-23 MED ORDER — LEVOTHYROXINE SODIUM 150 MCG PO TABS: 150.0000 ug | ORAL_TABLET | Freq: Every day | ORAL | 2 refills | Status: AC

## 2017-09-23 MED ORDER — HYDROCODONE-ACETAMINOPHEN 5-325 MG PO TABS: 1.0000 | ORAL_TABLET | ORAL | 0 refills | Status: DC | PRN

## 2017-09-23 MED ORDER — SENNOSIDES-DOCUSATE SODIUM 8.6-50 MG PO TABS: 2.0000 | ORAL_TABLET | Freq: Two times a day (BID) | ORAL | 1 refills | Status: AC

## 2017-09-23 MED ORDER — METHOCARBAMOL 500 MG PO TABS: 500.0000 mg | ORAL_TABLET | Freq: Three times a day (TID) | ORAL | 1 refills | Status: DC

## 2017-09-23 MED ORDER — METFORMIN HCL 1000 MG PO TABS: 1000.0000 mg | ORAL_TABLET | Freq: Two times a day (BID) | ORAL | 2 refills | Status: AC

## 2017-09-23 MED ORDER — AMLODIPINE BESYLATE 10 MG PO TABS: 10.0000 mg | ORAL_TABLET | Freq: Every day | ORAL | 1 refills | Status: AC

## 2017-09-23 MED ORDER — HYDROCHLOROTHIAZIDE 25 MG PO TABS: 12.5000 mg | ORAL_TABLET | Freq: Every morning | ORAL | 1 refills | Status: DC

## 2017-09-23 MED ORDER — ONDANSETRON HCL 4 MG PO TABS: 4.0000 mg | ORAL_TABLET | Freq: Four times a day (QID) | ORAL | 0 refills | Status: DC | PRN

## 2017-09-23 MED ORDER — ACETAMINOPHEN 325 MG PO TABS: 650.0000 mg | ORAL_TABLET | ORAL | 1 refills | Status: AC | PRN

## 2017-09-23 MED ORDER — ASPIRIN EC 81 MG PO TBEC: 81.0000 mg | DELAYED_RELEASE_TABLET | Freq: Every day | ORAL | 2 refills | Status: DC

## 2017-09-23 NOTE — Progress Notes (Signed)
Postop day 2.  Overall doing very well.  No significant neck pain.  Upper and lower extremity symptoms remain greatly improved.  Patient feels ready for discharge home.  Patient is done very well following 4 level anterior cervical decompression and fusion surgery.  She is cleared to be discharged home.  She may resume aspirin but should stay off Plavix at least for 2 weeks.  I will need to see her back my office in 2 weeks.

## 2017-09-23 NOTE — Progress Notes (Signed)
Physical Therapy Treatment Patient Details Name: Priscilla Tran MRN: [id number] DOB: 06/23/1935 Today's Date: 09/23/2017    History of Present Illness Pt is a 82 y.o. F with significant PMH of DM, HLD, HTN. Admitted 8/20 with 36 hours of left upper extremity and left lower extremity weakness. Has severe cervical stenosis secondary to multilevel spondylosis. Critical spinal cord compression at C5-6 and severe stenosis and cord compression at C3-4, C4-5, C6-7. Underwent C3-4, C4-5, C5-6, C6-7 anterior cervical discectomy with interbody fusion utilizing interbody allograft wedges and anterior plate instrumentation 8/26.     PT Comments    Patient requires min guard/min A for functional transfers and gait. Pt continues to demonstrate decreased L LE proprioception and c/o increased L LE tingling today. Pt needs cues to maintain cervical precautions throughout session. Daughter present. Current plan remains appropriate.   Follow Up Recommendations  Home health PT;Supervision/Assistance - 24 hour     Equipment Recommendations  Rolling walker with 5" wheels;3in1 (PT)    Recommendations for Other Services       Precautions / Restrictions Precautions Precautions: Fall;Cervical Precaution Booklet Issued: Yes (comment) Precaution Comments: pt repeatedly turning her head, moderate verbal cues for cervical precautions generalization Required Braces or Orthoses: Cervical Brace Cervical Brace: Soft collar    Mobility  Bed Mobility Overal bed mobility: Needs Assistance Bed Mobility: Sit to Sidelying;Rolling Rolling: Supervision       Sit to sidelying: Min guard General bed mobility comments: pt OOB in chair upon arrival  Transfers Overall transfer level: Needs assistance Equipment used: Rolling walker (2 wheeled) Transfers: Sit to/from Stand Sit to Stand: Min guard;Min assist         General transfer comment: assist to steady and cues for safe use of AD as pt tends to take hands from  RW   Ambulation/Gait Ambulation/Gait assistance: Min guard Gait Distance (Feet): 200 Feet Assistive device: Rolling walker (2 wheeled) Gait Pattern/deviations: Step-through pattern;Decreased dorsiflexion - left(steppage gait L LE ) Gait velocity: decreased   General Gait Details: pt with steppage-like gait L LE given decreased proprioception and reports increased tingling today; min guard for safety and safe use of AD; cues for maintaining cervical precautions    Stairs         General stair comments: pt and daughter educated on technique for ascend/descend of single step entering home    Wheelchair Mobility    Modified Rankin (Stroke Patients Only)       Balance Overall balance assessment: Needs assistance   Sitting balance-Leahy Scale: Good       Standing balance-Leahy Scale: Fair                              Cognition Arousal/Alertness: Awake/alert Behavior During Therapy: WFL for tasks assessed/performed Overall Cognitive Status: Impaired/Different from baseline Area of Impairment: Memory                     Memory: Decreased recall of precautions;Decreased short-term memory         General Comments: pt needs mod cues for maintaining cervical precautions       Exercises      General Comments General comments (skin integrity, edema, etc.): daughter present      Pertinent Vitals/Pain Pain Assessment: Faces Faces Pain Scale: Hurts a little bit Pain Location: L LE (increased L foot tingling) Pain Descriptors / Indicators: Cramping;Discomfort;Tingling;Numbness Pain Intervention(s): Monitored during session    Home Living  Prior Function            PT Goals (current goals can now be found in the care plan section) Acute Rehab PT Goals Patient Stated Goal: to be independent  Progress towards PT goals: Progressing toward goals    Frequency    Min 5X/week      PT Plan Current plan remains  appropriate    Co-evaluation              AM-PAC PT "6 Clicks" Daily Activity  Outcome Measure  Difficulty turning over in bed (including adjusting bedclothes, sheets and blankets)?: A Lot Difficulty moving from lying on back to sitting on the side of the bed? : Unable Difficulty sitting down on and standing up from a chair with arms (e.g., wheelchair, bedside commode, etc,.)?: Unable Help needed moving to and from a bed to chair (including a wheelchair)?: A Little Help needed walking in hospital room?: A Little Help needed climbing 3-5 steps with a railing? : A Lot 6 Click Score: 12    End of Session Equipment Utilized During Treatment: Gait belt;Cervical collar Activity Tolerance: Patient tolerated treatment well Patient left: in chair;with call bell/phone within reach;with family/visitor present Nurse Communication: Mobility status PT Visit Diagnosis: Other abnormalities of gait and mobility (R26.89);Muscle weakness (generalized) (M62.81);Pain Pain - part of body: (neck)     Time: [id number] PT Time Calculation (min) (ACUTE ONLY): 11 min  Charges:  $Gait Training: 8-22 mins                     Erline LevineKellyn Temitayo Covalt, PTA Pager: [phone]     Carolynne EdouardKellyn R Rohn Fritsch 09/23/2017, 2:14 PM

## 2017-09-23 NOTE — Discharge Instructions (Signed)
1) hold Plavix for about 2 weeks until reevaluated by neurosurgery in the office 2) continue aspirin 81 mg with food daily 3) take all your other medications as prescribed 4) follow instructions given to you with regards to activity limitations by the neurosurgical team 5) a home health physical therapist and occupational therapist to be coming to the house to help you do rehab and recover 6) avoid constipation-    Wound Care Keep incision area dry.  You may remove outer bandage after 2 days and shower.   If you shower prior cover incision with plastic wrap.  Do not put any creams, lotions, or ointments on incision. Leave steri-strips on neck.  They will fall off by themselves. Activity Walk each and every day, increasing distance each day. No lifting greater than 5 lbs.  Avoid excessive neck motion. No driving for 2 weeks; may ride as a passenger locally. Wear neck brace at all times except when showering. Diet Resume your normal diet.  Return to Work Will be discussed at you follow up appointment. Call Your Doctor If Any of These Occur Redness, drainage, or swelling at the wound.  Temperature greater than 101 degrees. Severe pain not relieved by pain medication. Increased difficulty swallowing.  Incision starts to come apart. Follow Up Appt Call today and ask for Lurena JoinerRebecca for appointment in 1-2 weeks ([phone]) or for problems.  If you have any hardware placed in your spine, you will need an x-ray before your appointment.   1) hold Plavix for about 2 weeks until reevaluated by neurosurgery in the office 2) continue aspirin 81 mg with food daily 3) take all your other medications as prescribed 4) follow instructions given to you with regards to activity limitations by the neurosurgical team 5) a home health physical therapist and occupational therapist to be coming to the house to help you do rehab and recover 6) avoid constipation-

## 2017-09-23 NOTE — Discharge Summary (Signed)
Priscilla Tran, is a 82 y.o. female  DOB 07/15/1935  MRN [id number].  Admission date:  09/15/2017  Admitting Physician  Hillary Bow, DO  Discharge Date:  09/23/2017   Primary MD  Tracey Harries, MD  Recommendations for primary care physician for things to follow:   1) Hold Plavix for about 2 weeks until reevaluated by neurosurgery in the office 2) continue aspirin 81 mg with food daily 3) Take all your other medications as prescribed 4) follow instructions given to you with regards to activity limitations by the neurosurgical team 5) A home health physical therapist and occupational therapist to be coming to the house to help you do rehab and recover 6) Avoid constipation-   Admission Diagnosis  Left leg numbness [R20.0]   Discharge Diagnosis  Left leg numbness [R20.0]   Principal Problem:   Left-sided weakness Active Problems:   Glaucoma associated with ocular inflammation, severe stage   Hypertension   Diabetes type 2, controlled (HCC)   Cervical spinal cord compression (HCC)   TIA (transient ischemic attack)      Past Medical History:  Diagnosis Date  . Diabetes mellitus   . Disc degeneration, lumbar    Dr. Regino Schultze  . Glaucoma associated with ocular inflammation, severe stage    blind Right eye  . Hyperlipidemia   . Hypertension   . Hypothyroid   . SBO (small bowel obstruction) s/p lap LOA [id number] 03/31/2011    Past Surgical History:  Procedure Laterality Date  . EYE SURGERY  2008   glaucoma right eye. Per patient she has had multiple surgeries on right eye, resulting in scar tissue.  Marland Kitchen LAPAROSCOPY  04/04/2011   Procedure: LAPAROSCOPY DIAGNOSTIC;  Surgeon: Ardeth Sportsman, MD;  Location: [facility];  Service: General;  Laterality: N/A;  . NM MYOCAR PERF WALL MOTION  06/20/2010   Normal  . US ECHOCARDIOGRAPHY  05/06/2005   Mild MR, PI       HPI  from the history and physical done on  the day of admission:    Chief Complaint: L sided weakness  HPI: Priscilla Tran is a 82 y.o. 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  Patient was admitted on 7/27-7/29 for TIA work up for L sided weakness that resolved.  It appears during that admit that work up for TIA was negative.  However, of interest it appears that CT C spine was performed later during the admit which showed "likely cord compression".  Patient ultimately was discharged on the 29th as she was asymptomatic.  Today around 1300 patient developed L leg weakness and numbness, and L arm weakness.  Symptoms have improved some since onset but are persistent.   ED Course: In the ED MRI brain shows: "Newly apparent tiny foci of blood products within the occipital horns of lateral ventricles and fourth ventricle are probably due to differences in technique with the current SWAN being more sensitive than the prior SWI. New hemorrhage is less likely."  No acute stroke on MRI.  Neuro consulted and hospitalist asked to admit.    Hospital Course:   Principal Problem:   Left-sided weakness Active Problems:   Glaucoma associated with ocular inflammation, severe stage   Hypertension   Diabetes type 2, controlled (HCC)   Cervical spinal cord compression [facility] [place])    Brief Narrative:   82 year old female with history of diabetes mellitus type 2, essential hypertension, glaucoma of the right eye came to the hospital with complains of worsening of her lower extremity weakness. Upon admission she had CT of the cervical spine which showed concerning for severe cervical stenosis with possible cord compression. Neurosurgery was consulted who recommended holding aspirin Plavix for 5 days to allow for  surgical intervention on 09/21/2017. Patient was evaluated physical therapy in the hospital who recommended skilled nursing facility but family and the patient refused to go there. Patient ended up  having C spine discectomy C3-C7 on 09/21/17 which she tolerated the procedure well.    Assessment & Plan:  1)Left-sided weakness especially lower extremity due to severe cervical stenosis; stable; improving-  S/p C3-C7 anterior Cervical Discectomy with interbody fusion utilizing interbody allograft and anterior place instrucmentation.  - Follow -Dr. Lindalou Hose  recommendations in terms of her activity and follow-up care/ follow instructions given to you with regards to activity limitations by the neurosurgical team  2)Essential hypertension- - okay to resume amlodipine 10 mg daily, Coreg 2.5 mg twice daily   3)History of glaucoma- -Continue home meds   Code Status: Full   Disposition Plan: home with [facility], patient and family declined skilled nursing facility placement Consultants:  Neurosurgery  Discharge Condition: stable  Follow UP  Follow-up Information    Tracey Harries, MD. Schedule an appointment as soon as possible for a visit in 1 week(s).   Specialty:  Family Medicine Contact information: [address] [facility] [place] [address] [phone]        Julio Sicks, MD. Call in 1 day(s).   Specialty:  Neurosurgery Why:  Needs Surgery on Monday. Patient and the Surgeon aware.  Contact information: [address] [place] [address] [phone]        [facility] - Dme Follow up.   Why:  Rolling walker and bedside commode will be delivered to bedside prior to discharge Contact information: [address] [place] [address] [phone]        [facility], [facility] Follow up.   Specialty:  Home Health Services Why:  home health services arranged Contact information: [address] [place] [address] [phone]            Consults obtained - Neurosurgery  Diet and Activity recommendation:  As advised  Discharge Instructions    Discharge Instructions     Call MD for:  difficulty breathing, headache or visual disturbances   Complete by:  As directed    Call MD for:  persistant dizziness or light-headedness   Complete by:  As directed    Call MD for:  persistant nausea and vomiting   Complete by:  As directed    Call MD for:  redness, tenderness, or signs of infection (pain, swelling, redness, odor or green/yellow discharge around incision site)   Complete by:  As directed    Call MD for:  severe uncontrolled pain   Complete by:  As directed    Call MD for:  temperature >100.4   Complete by:  As directed    Diet - low sodium heart healthy   Complete by:  As directed    Discharge instructions   Complete by:  As directed    1) hold Plavix for about 2 weeks until reevaluated by neurosurgery in the office 2) continue aspirin 81 mg with food daily 3) take all your other medications as prescribed 4) follow instructions given to you with regards to activity limitations by the neurosurgical team 5) a home health physical therapist and occupational therapist to be coming to the house to help you do rehab and recover 6) avoid constipation-   Increase activity slowly   Complete by:  As directed         Discharge Medications     Allergies as of 09/23/2017      Reactions   Lisinopril Swelling   FACE THROAT " Breathing not affected per patient."      Medication List    TAKE these medications   acetaminophen 325 MG tablet Commonly known as:  TYLENOL Take 2 tablets (650 mg total) by mouth every 4 (four) hours as needed for mild pain (or temp > 37.5 C (99.5 F)).   amLODipine 10 MG tablet Commonly known as:  NORVASC Take 1 tablet (10 mg total) by mouth daily.   aspirin EC 81 MG tablet Take 1 tablet (81 mg total) by mouth daily. With food What changed:  additional instructions   carvedilol 12.5 MG tablet Commonly known as:  COREG Take 1 tablet (12.5 mg total) by mouth 2 (two) times daily with a meal. What changed:  when to take  this   clopidogrel 75 MG tablet Commonly known as:  PLAVIX Take 1 tablet (75 mg total) by mouth daily. Hold until 10/08/17 What changed:  additional instructions   dorzolamide-timolol 22.3-6.8 MG/ML ophthalmic solution Commonly known as:  COSOPT Place 1 drop into the left eye 2 (two) times daily.   hydrochlorothiazide 25 MG tablet Commonly known as:  HYDRODIURIL Take 0.5 tablets (12.5 mg total) by mouth every morning. What changed:    how much to take  when to take this   HYDROcodone-acetaminophen 5-325 MG tablet Commonly known as:  NORCO/VICODIN Take 1 tablet by mouth every 4 (four) hours as needed for moderate pain or severe pain.   levothyroxine 150 MCG tablet Commonly known as:  SYNTHROID, LEVOTHROID Take 1 tablet (150 mcg total) by mouth daily.   lovastatin 10 MG tablet Commonly known as:  MEVACOR Take 2 tablets (20 mg total) by mouth at bedtime.   metFORMIN 1000 MG tablet Commonly known as:  GLUCOPHAGE Take 1 tablet (1,000 mg total) by mouth 2 (two) times daily with a meal.   methocarbamol 500 MG tablet Commonly known as:  ROBAXIN Take 1 tablet (500 mg total) by mouth 3 (three) times daily. For Muscle spasm   ondansetron 4 MG tablet Commonly known as:  ZOFRAN Take 1 tablet (4 mg total) by mouth every 6 (six) hours as needed for nausea or vomiting.   prednisoLONE acetate 1 % ophthalmic suspension Commonly known as:  PRED FORTE Place 1 drop into the left eye 2 (two) times daily.   senna-docusate 8.6-50 MG tablet Commonly known as:  Senokot-S Take 2 tablets by mouth 2 (two) times daily.   VITAMIN B-12 PO Take 1 tablet by mouth daily.            Durable Medical Equipment  (From admission, onward)         Start  Ordered   09/23/17 1116  For home use only DME Walker rolling  [facility])  Once    Question:  Patient needs a walker to treat with the following condition  Answer:  Weakness   09/23/17 1116   09/23/17 0827  For home use only DME 3 n 1   Once     09/23/17 0827   09/23/17 0827  For home use only DME Walker rolling  Once    Question:  Patient needs a walker to treat with the following condition  Answer:  Weakness   09/23/17 0827          Major procedures and Radiology Reports - PLEASE review detailed and final reports for all details, in brief -   Dg Cervical Spine 1 View  Result Date: 09/21/2017 CLINICAL DATA:  82 year old female undergoing C3-C7 anterior cervical decompression and fusion EXAM: DG C-ARM 61-120 MIN; DG CERVICAL SPINE - 1 VIEW COMPARISON:  Preoperative MRI 09/16/2017 FINDINGS: A total of 4 intraoperative spot images are submitted for review. The images demonstrate in progress C3-C7 anterior cervical discectomy and fusion with a vertebral body screw and plate construct. Interbody grafts are present at C3-C4, C4-C5, C5-C6 and C6-C7. No evidence of immediate hardware complication. IMPRESSION: In progress C3-C7 ACDF as above. Electronically Signed   By: Malachy Moan M.D.   On: 09/21/2017 16:54   Mr Maxine Glenn Head Wo Contrast  Result Date: 09/15/2017 CLINICAL DATA:  82 y/o  F; left-sided weakness. EXAM: MRI HEAD WITHOUT CONTRAST MRA HEAD WITHOUT CONTRAST TECHNIQUE: Multiplanar, multiecho pulse sequences of the brain and surrounding structures were obtained without intravenous contrast. Angiographic images of the head were obtained using MRA technique without contrast. COMPARISON:  09/15/2017 CT head. FINDINGS: MRI HEAD FINDINGS Brain: No acute infarction, hydrocephalus, extra-axial collection or mass lesion. Several stable nonspecific T2 FLAIR hyperintensities in subcortical and periventricular white matter are compatible with mild chronic microvascular ischemic changes for age. Mild volume loss of the brain. There is sulcal susceptibility hypointensity within the sylvian fissures bilaterally as well as punctate foci within the occipital horns of lateral ventricles and within the fourth ventricle (series 10, image 54, 48,  27). Findings are compatible with cirrhosis. Susceptibility hypointensity within the sylvian fissures is stable from MRI of the brain. Punctate foci within the lateral and fourth ventricle are newly apparent. Vascular: As below. Skull and upper cervical spine: Normal marrow signal. Sinuses/Orbits: Negative. Other: None. MRA HEAD FINDINGS Anterior circulation: Left paraophthalmic artery 2 mm inferiorly directed outpouching (series 551, image 7 and series 5, image 92) possibly representing a tiny aneurysm versus is prominent infundibulum of diminutive vessel. No large vessel occlusion significant stenosis is identified. Posterior circulation: Left P3 superior division origin severe stenosis. No large vessel occlusion, aneurysm, or additional segment of significant stenosis is identified. Anatomic variant: Bilateral fetal PCA. IMPRESSION: MRI head: 1. Stable mild siderosis within the sylvian fissures. Newly apparent tiny foci of blood products within the occipital horns of lateral ventricles and fourth ventricle are probably due to differences in technique with the current SWAN being more sensitive than the prior SWI. New hemorrhage is less likely. 2. No stroke, mass effect, or herniation. 3. Stable mild chronic microvascular ischemic changes and volume loss of the brain. MRA head: 1. Stable left P3 superior division severe stenosis. 2. No large vessel occlusion or new segment of significant stenosis is identified. 3. 2 mm left paraophthalmic ICA inferiorly directed outpouching may represent a tiny aneurysm or the infundibular origin of a diminutive vessel.  Electronically Signed   By: Mitzi Hansen M.D.   On: 09/15/2017 21:42   Mr Brain Wo Contrast  Result Date: 09/15/2017 CLINICAL DATA:  82 y/o  F; left-sided weakness. EXAM: MRI HEAD WITHOUT CONTRAST MRA HEAD WITHOUT CONTRAST TECHNIQUE: Multiplanar, multiecho pulse sequences of the brain and surrounding structures were obtained without intravenous  contrast. Angiographic images of the head were obtained using MRA technique without contrast. COMPARISON:  09/15/2017 CT head. FINDINGS: MRI HEAD FINDINGS Brain: No acute infarction, hydrocephalus, extra-axial collection or mass lesion. Several stable nonspecific T2 FLAIR hyperintensities in subcortical and periventricular white matter are compatible with mild chronic microvascular ischemic changes for age. Mild volume loss of the brain. There is sulcal susceptibility hypointensity within the sylvian fissures bilaterally as well as punctate foci within the occipital horns of lateral ventricles and within the fourth ventricle (series 10, image 54, 48, 27). Findings are compatible with cirrhosis. Susceptibility hypointensity within the sylvian fissures is stable from MRI of the brain. Punctate foci within the lateral and fourth ventricle are newly apparent. Vascular: As below. Skull and upper cervical spine: Normal marrow signal. Sinuses/Orbits: Negative. Other: None. MRA HEAD FINDINGS Anterior circulation: Left paraophthalmic artery 2 mm inferiorly directed outpouching (series 551, image 7 and series 5, image 92) possibly representing a tiny aneurysm versus is prominent infundibulum of diminutive vessel. No large vessel occlusion significant stenosis is identified. Posterior circulation: Left P3 superior division origin severe stenosis. No large vessel occlusion, aneurysm, or additional segment of significant stenosis is identified. Anatomic variant: Bilateral fetal PCA. IMPRESSION: MRI head: 1. Stable mild siderosis within the sylvian fissures. Newly apparent tiny foci of blood products within the occipital horns of lateral ventricles and fourth ventricle are probably due to differences in technique with the current SWAN being more sensitive than the prior SWI. New hemorrhage is less likely. 2. No stroke, mass effect, or herniation. 3. Stable mild chronic microvascular ischemic changes and volume loss of the brain.  MRA head: 1. Stable left P3 superior division severe stenosis. 2. No large vessel occlusion or new segment of significant stenosis is identified. 3. 2 mm left paraophthalmic ICA inferiorly directed outpouching may represent a tiny aneurysm or the infundibular origin of a diminutive vessel. Electronically Signed   By: Mitzi Hansen M.D.   On: 09/15/2017 21:42   Mr Cervical Spine Wo Contrast  Result Date: 09/16/2017 CLINICAL DATA:  LEFT-sided weakness, suspect cord compression. EXAM: MRI CERVICAL SPINE WITHOUT CONTRAST TECHNIQUE: Multiplanar, multisequence MR imaging of the cervical spine was performed. No intravenous contrast was administered. COMPARISON:  CT cervical spine August 23, 2017 FINDINGS: ALIGNMENT: Straightened cervical lordosis. No cervical malalignment. Grade 1 T1-2 anterolisthesis on a degenerative basis. VERTEBRAE/DISCS: Vertebral bodies are intact. Severe C5-6 disc height loss with proportional acute on chronic discogenic endplate changes. Moderate C6-7, mild C3-4 and C4-5 disc height loss with mild-to-moderate chronic discogenic endplate changes. Diffuse disc desiccation. Generalized bright T1 bone marrow signal compatible with osteopenia. Severe LEFT C1-2 lateral mass arthropathy. CORD:Myelomalacia and potential superimposed edema cervical spinal cord from C4-5 through C6-7. No syrinx. POSTERIOR FOSSA, VERTEBRAL ARTERIES, PARASPINAL TISSUES: No MR findings of ligamentous injury. Vertebral artery flow voids present. Included posterior fossa and paraspinal soft tissues are normal. Low signal pannus about the odontoid process seen with CPPD. DISC LEVELS: C2-3: Annular bulging, uncovertebral hypertrophy. Severe LEFT facet arthropathy. No canal stenosis. Mild RIGHT, moderate LEFT neural foraminal narrowing. C3-4: 4 mm broad-based disc bulge, uncovertebral hypertrophy, mild RIGHT and severe LEFT facet arthropathy. Moderate canal stenosis.  Severe bilateral neural foraminal narrowing. C4-5:  Broad-based disc bulge and RIGHT central disc protrusion total measuring 3 mm in AP dimension. Uncovertebral hypertrophy and moderate RIGHT facet arthropathy. Moderate canal stenosis. Severe bilateral neural foraminal narrowing. C5-6: 5 mm broad-based disc bulge with superimposed undersurface disc protrusion, uncovertebral hypertrophy and mild facet arthropathy. Severe canal stenosis, AP dimension of the spinal canal is 4 mm. Severe bilateral neural foraminal narrowing. C6-7: 4 mm broad-based disc bulge. Uncovertebral hypertrophy. Moderate canal stenosis. Moderate to severe neural foraminal narrowing. C7-T1: No disc bulge. Moderate RIGHT greater LEFT facet arthropathy without canal stenosis. Mild RIGHT and moderate LEFT neural foraminal narrowing. IMPRESSION: 1. Degenerative change of the cervical spine without fracture or malalignment. 2. C4-5 through C6-7 myelomalacia and possible cord edema. 3. Severe canal stenosis C5-6 with cord compression. Moderate canal stenosis C3-4, C4-5 and C6-7. 4. Neural foraminal narrowing all cervical levels: Severe from C3-4 to C5-6. Electronically Signed   By: Awilda Metro M.D.   On: 09/16/2017 00:53   Dg C-arm 1-60 Min  Result Date: 09/21/2017 CLINICAL DATA:  82 year old female undergoing C3-C7 anterior cervical decompression and fusion EXAM: DG C-ARM 61-120 MIN; DG CERVICAL SPINE - 1 VIEW COMPARISON:  Preoperative MRI 09/16/2017 FINDINGS: A total of 4 intraoperative spot images are submitted for review. The images demonstrate in progress C3-C7 anterior cervical discectomy and fusion with a vertebral body screw and plate construct. Interbody grafts are present at C3-C4, C4-C5, C5-C6 and C6-C7. No evidence of immediate hardware complication. IMPRESSION: In progress C3-C7 ACDF as above. Electronically Signed   By: Malachy Moan M.D.   On: 09/21/2017 16:54   Ct Head Code Stroke Wo Contrast  Result Date: 09/15/2017 CLINICAL DATA:  Code stroke.  Left-sided weakness with  unsteady gait EXAM: CT HEAD WITHOUT CONTRAST TECHNIQUE: Contiguous axial images were obtained from the base of the skull through the vertex without intravenous contrast. COMPARISON:  08/23/2017 FINDINGS: Brain: No evidence of acute infarction, hemorrhage, hydrocephalus, extra-axial collection or mass lesion/mass effect. Generalized atrophy and periventricular chronic small vessel ischemia that is mild for age. Vascular: No hyperdense vessel.  Atherosclerotic calcification. Skull: No acute finding.  Left C1-2 severe facet arthropathy. Sinuses/Orbits: Bilateral glaucoma reservoir. Cataract resection bilateral Other: These results were communicated to Dr. Pearlean Brownie at 3:47 pmon 8/20/2019by text page via the [facility] messaging system. ASPECTS [facility] Stroke Program Early CT Score) - Ganglionic level infarction (caudate, lentiform nuclei, internal capsule, insula, M1-M3 cortex): 7 - Supraganglionic infarction (M4-M6 cortex): 3 Total score (0-10 with 10 being normal): 10 IMPRESSION: No acute finding.  ASPECTS is 10. Electronically Signed   By: Marnee Spring M.D.   On: 09/15/2017 15:48    Micro Results    Recent Results (from the past 240 hour(s))  Surgical PCR screen     Status: Abnormal   Collection Time: 09/20/17 12:47 PM  Result Value Ref Range Status   MRSA, PCR NEGATIVE NEGATIVE Final   Staphylococcus aureus POSITIVE (A) NEGATIVE Final    Comment: (NOTE) The Xpert SA Assay (FDA approved for NASAL specimens in patients 70 years of age and older), is one component of a comprehensive surveillance program. It is not intended to diagnose infection nor to guide or monitor treatment. Performed at [facility], [address] [place], [place] [address]     Today   Subjective    Rashell Shambaugh today has no new concerns , eager to go home           Patient has been seen  and examined prior to discharge   Objective   Blood pressure 110/73, pulse 66, temperature 98.4 F (36.9 C), resp. rate 17,  height 5\' 5"  (1.651 m), weight 64.3 kg, SpO2 94 %.   Intake/Output Summary (Last 24 hours) at 09/23/2017 1120 Last data filed at 09/23/2017 1009 Gross per 24 hour  Intake 713 ml  Output -  Net 713 ml    Exam Gen:- Awake Alert,  In no apparent distress  HEENT:- [place].AT, No sclera icterus Neck-healing postop wound Lungs-  CTAB , no wheezing CV- S1, S2 normal Abd-  +ve B.Sounds, Abd Soft, No tenderness,    Extremity/Skin:- No  edema,   good pulses Psych-affect is appropriate, oriented x3 Neuro-no new focal deficits, no tremors   Data Review   CBC w Diff:  Lab Results  Component Value Date   WBC 7.2 09/15/2017   HGB 13.9 09/21/2017   HCT 41.0 09/21/2017   PLT 228 09/15/2017   LYMPHOPCT 20 09/15/2017   MONOPCT 9 09/15/2017   EOSPCT 3 09/15/2017   BASOPCT 1 09/15/2017    CMP:  Lab Results  Component Value Date   NA 133 (L) 09/21/2017   K 4.3 09/21/2017   CL 98 09/15/2017   CO2 26 09/15/2017   BUN 12 09/15/2017   CREATININE 0.50 09/15/2017   PROT 7.7 09/15/2017   ALBUMIN 4.5 09/15/2017   BILITOT 0.8 09/15/2017   ALKPHOS 66 09/15/2017   AST 21 09/15/2017   ALT 12 09/15/2017  .   Total Discharge time is about 33 minutes  Shon Hale M.D on 09/23/2017 at 11:20 AM  Pager---[phone]  Go to [email or web address] - password [email or web address] for contact info  [facility] - Office  [phone]

## 2017-09-23 NOTE — Progress Notes (Signed)
Occupational Therapy Treatment Patient Details Name: Priscilla GowdaRita L Milnes MRN: [id number] DOB: 01-06-1936 Today's Date: 09/23/2017    History of present illness Pt is a 82 y.o. F with significant PMH of DM, HLD, HTN. Admitted 8/20 with 36 hours of left upper extremity and left lower extremity weakness. Has severe cervical stenosis secondary to multilevel spondylosis. Critical spinal cord compression at C5-6 and severe stenosis and cord compression at C3-4, C4-5, C6-7. Underwent C3-4, C4-5, C5-6, C6-7 anterior cervical discectomy with interbody fusion utilizing interbody allograft wedges and anterior plate instrumentation 8/26.    OT comments  Pt fatigued having spent time up in chair. Assisted to bathroom, to groom at sink and return to bed. Pt educated in cervical precautions related to ADL and IADL. Pt with difficulty generalizing precautions. Min guard assist required for safety with RW and bed mobility. Pt with c/o of L LE cramping. Pt is eager to go home today. She will have her daughter available to assist.  Follow Up Recommendations  Home health OT;Supervision/Assistance - 24 hour    Equipment Recommendations       Recommendations for Other Services      Precautions / Restrictions Precautions Precautions: Fall;Cervical Precaution Booklet Issued: Yes (comment) Precaution Comments: pt repeatedly turning her head, moderate verbal cues for cervical precautions generalization Required Braces or Orthoses: Cervical Brace Cervical Brace: Soft collar       Mobility Bed Mobility Overal bed mobility: Needs Assistance Bed Mobility: Sit to Sidelying;Rolling Rolling: Supervision       Sit to sidelying: Min guard General bed mobility comments: cues for log roll technique  Transfers Overall transfer level: Needs assistance Equipment used: Rolling walker (2 wheeled) Transfers: Sit to/from Stand Sit to Stand: Min guard         General transfer comment: increased time, min guard from  chair and 3 in 1    Balance Overall balance assessment: Needs assistance   Sitting balance-Leahy Scale: Good       Standing balance-Leahy Scale: Fair                             ADL either performed or assessed with clinical judgement   ADL Overall ADL's : Needs assistance/impaired     Grooming: Min guard;Standing;Wash/dry hands         Lower Body Bathing Details (indicate cue type and reason): recommended long handled bath sponge, pt plans to sit to shower with daughter supervising Upper Body Dressing : Set up;Sitting Upper Body Dressing Details (indicate cue type and reason): to doff front opening gown Lower Body Dressing: Min guard;Sit to/from stand Lower Body Dressing Details (indicate cue type and reason): pt able to cross foot over opposite knee Toilet Transfer: Min guard;Ambulation;RW   Toileting- Clothing Manipulation and Hygiene: Minimal assistance;Sit to/from stand       Functional mobility during ADLs: Health and safety inspectorMin guard;Rolling walker       Vision       Perception     Praxis      Cognition Arousal/Alertness: Awake/alert Behavior During Therapy: WFL for tasks assessed/performed Overall Cognitive Status: Impaired/Different from baseline Area of Impairment: Memory                     Memory: Decreased recall of precautions;Decreased short-term memory         General Comments: pt reeducated in cervical precautions related to ADL and IADL        Exercises  Shoulder Instructions       General Comments      Pertinent Vitals/ Pain       Pain Assessment: Faces Faces Pain Scale: Hurts little more Pain Location: L LE  Pain Descriptors / Indicators: Cramping Pain Intervention(s): Monitored during session;Premedicated before session;Repositioned  Home Living                                          Prior Functioning/Environment              Frequency  Min 3X/week        Progress Toward  Goals  OT Goals(current goals can now be found in the care plan section)  Progress towards OT goals: Progressing toward goals  Acute Rehab OT Goals Patient Stated Goal: to be independent  OT Goal Formulation: With patient Time For Goal Achievement: 10/06/17 Potential to Achieve Goals: Good  Plan Discharge plan remains appropriate    Co-evaluation                 AM-PAC PT "6 Clicks" Daily Activity     Outcome Measure   Help from another person eating meals?: A Little Help from another person taking care of personal grooming?: A Little Help from another person toileting, which includes using toliet, bedpan, or urinal?: A Little Help from another person bathing (including washing, rinsing, drying)?: A Little Help from another person to put on and taking off regular upper body clothing?: A Little Help from another person to put on and taking off regular lower body clothing?: A Little 6 Click Score: 18    End of Session Equipment Utilized During Treatment: Gait belt;Rolling walker;Cervical collar  OT Visit Diagnosis: Unsteadiness on feet (R26.81);Other abnormalities of gait and mobility (R26.89);Muscle weakness (generalized) (M62.81);Pain   Activity Tolerance Patient tolerated treatment well   Patient Left in bed;with call bell/phone within reach;with bed alarm set   Nurse Communication          Time: 1610-9604 OT Time Calculation (min): 16 min  Charges: OT General Charges $OT Visit: 1 Visit OT Treatments $Self Care/Home Management : 8-22 mins  09/23/2017 Martie Round, OTR/L Pager: [phone]   Iran Planas, [place] Bailiff 09/23/2017, 12:26 PM

## 2017-09-24 ENCOUNTER — Encounter (HOSPITAL_COMMUNITY): Payer: Self-pay | Admitting: Neurosurgery

## 2017-10-16 ENCOUNTER — Encounter: Payer: Self-pay | Admitting: Adult Health

## 2017-10-16 ENCOUNTER — Ambulatory Visit (INDEPENDENT_AMBULATORY_CARE_PROVIDER_SITE_OTHER): Payer: Medicare HMO | Admitting: Adult Health

## 2017-10-16 VITALS — BP 133/73 | HR 71 | Ht 65.0 in | Wt 140.4 lb

## 2017-10-16 DIAGNOSIS — R531 Weakness: Secondary | ICD-10-CM | POA: Diagnosis not present

## 2017-10-16 DIAGNOSIS — G9520 Unspecified cord compression: Secondary | ICD-10-CM

## 2017-10-16 DIAGNOSIS — I10 Essential (primary) hypertension: Secondary | ICD-10-CM

## 2017-10-16 DIAGNOSIS — G459 Transient cerebral ischemic attack, unspecified: Secondary | ICD-10-CM

## 2017-10-16 DIAGNOSIS — E119 Type 2 diabetes mellitus without complications: Secondary | ICD-10-CM

## 2017-10-16 MED ORDER — LOVASTATIN 20 MG PO TABS: 20.0000 mg | ORAL_TABLET | Freq: Every day | ORAL | 4 refills | Status: DC

## 2017-10-16 MED ORDER — CLOPIDOGREL BISULFATE 75 MG PO TABS: 75.0000 mg | ORAL_TABLET | Freq: Every day | ORAL | 4 refills | Status: DC

## 2017-10-16 NOTE — Progress Notes (Signed)
I agree with the above plan 

## 2017-10-16 NOTE — Patient Instructions (Signed)
Continue clopidogrel 75 mg daily  and lovastatin 20mg   for secondary stroke prevention  Stop aspirin 81mg  and continue plavix only at this time  Continue to follow up with PCP regarding cholesterol, diabetes and blood pressure management   Continue physical therapy - if you need additional orders, please let us know and we can place them  Continue to monitor blood pressure at home  Maintain strict control of hypertension with blood pressure goal below 130/90, diabetes with hemoglobin A1c goal below 6.5% and cholesterol with LDL cholesterol (bad cholesterol) goal below 70 mg/dL. I also advised the patient to eat a healthy diet with plenty of whole grains, cereals, fruits and vegetables, exercise regularly and maintain ideal body weight.  Followup in the future with me in 3 months or call earlier if needed       [facility]. I hope we have been able to provide you high quality care today.  You may receive a patient satisfaction survey over the next few weeks. We would appreciate your feedback and comments so that we may continue to improve ourselves and the health of our patients.

## 2017-10-16 NOTE — Progress Notes (Signed)
[facility] [address] [place] [id number] [phone]       OFFICE FOLLOW UP NOTE  Priscilla Tran Date of Birth:  1935/09/07 Medical Record Number:  [id number]   Reason for Referral:  hospital TIA follow up  CHIEF COMPLAINT:  Chief Complaint  Patient presents with  . Follow-up    Stroke follow up. Patient is accomplanied by her daughter. Treatment room. Patient stated since she her surgery she has been feeling great.    HPI: Priscilla Tran is being seen today for initial visit in the office for TIA on 08/22/2017. History obtained from patient, daughter and chart review. Reviewed all radiology images and labs personally.  Priscilla Tran is a 82 y.o. female with history of small bowel obstruction, hypothyroidism, hypertension, hyperlipidemia, and diabetes mellitus  who presented with transient facial droop, dysarthria, confusion and left-sided weakness. She did not receive IV t-PA due to resolution of deficits.  CT head and MRI brain reviewed and was negative for acute abnormality or hemorrhage.  MRA head showed high-grade stenosis in the superior left P3 segment.  Carotid Doppler showed normal ICAs.  2D echo showed an EF of 60 to 65% without cardiac source of embolus identified.  LDL 97 and recommended increase of lovastatin from 10 mg to 20 mg daily.  A1c 6.3 and recommend continue follow-up with PCP for DM management.  HTN stable during admission and recommended long-term BP goal normotensive range.  As patient was on aspirin 81 mg PTA recommended DAPT for 3 weeks then Plavix alone.  It was believed symptoms were consistent with TIA given risk factors.  Of note, CT spine was obtained and showed possible cord compression and recommended follow-up with neurosurgery outpatient for repeat imaging.  Symptoms resolved, patient was discharged home in satisfactory condition without therapy needs. Patient return to ED on 09/15/2017 with complaints of return of left  leg weakness and numbness along with left arm weakness.  The symptoms resolved prior to discharge from recent TIA.  MRI was negative for acute stroke.  MR cervical spine showed concerning for severe cervical stenosis with possible cord compression.  Neurosurgery was consulted and recommended holding aspirin and Plavix for 5 days for surgical intervention.  C-spine discectomy C3-C7 was performed on 09/21/2017 and tolerated well without complication.  PT recommended SNF but patient and family refused.  Patient was discharged home with recommendations of home health services with recommendations of following with neurosurgery and holding Plavix for 2 weeks post procedure but recommended continuation of aspirin 81 mg daily.  Patient is being seen today for hospital follow-up and is accompanied by her daughter.  She does still have some left leg weakness with left foot pain and numbness/tingling but overall states she is been doing well.  Denies upper extremity weakness or tingling/numbness.  She continues to participate in home PT that she receives twice per week.  She is living with her daughter which is where she resided prior to hospitalization.  She is using rolling walker but does have a goal of returning to cane which she was also using prior.  She denies any recent falls.  Has continued on both aspirin and Plavix with mild bruising but no bleeding.  She continues to take lovastatin without side effects myalgias.  Blood pressure today satisfactory 133/73.  She continues to monitor glucose levels at home and have been stable except for yesterday morning severely elevated reading at 500.  Patient did call PCP and advised  her to recheck glucose reading which was at 89 (this reading was obtained 2 hours after 500 reading) without additional insulin.  Patient was told by nurse at PCP office that this elevation was most likely due to steroids that were obtained during hospitalization for back surgery.  Denies new or  worsening stroke/TIA symptoms.   ROS:   14 system review of systems performed and negative with exception of constipation and left foot cramp  PMH:  Past Medical History:  Diagnosis Date  . Diabetes mellitus   . Disc degeneration, lumbar    Dr. Regino Schultze  . Glaucoma associated with ocular inflammation, severe stage    blind Right eye  . Hyperlipidemia   . Hypertension   . Hypothyroid   . SBO (small bowel obstruction) s/p lap LOA [id number] 03/31/2011    PSH:  Past Surgical History:  Procedure Laterality Date  . ANTERIOR CERVICAL DECOMPRESSION/DISCECTOMY FUSION 4 LEVELS N/A 09/21/2017   Procedure: Cervical three-four, Cervical four-five, Cervical five-six, Cervical six-seven Anterior Cervical Decompression Fusion;  Surgeon: Julio Sicks, MD;  Location: [facility] OR;  Service: Neurosurgery;  Laterality: N/A;  . EYE SURGERY  2008   glaucoma right eye. Per patient she has had multiple surgeries on right eye, resulting in scar tissue.  Marland Kitchen LAPAROSCOPY  04/04/2011   Procedure: LAPAROSCOPY DIAGNOSTIC;  Surgeon: Ardeth Sportsman, MD;  Location: [facility] ORS;  Service: General;  Laterality: N/A;  . NM MYOCAR PERF WALL MOTION  06/20/2010   Normal  . US ECHOCARDIOGRAPHY  05/06/2005   Mild MR, PI    Social History:  Social History   Socioeconomic History  . Marital status: Widowed    Spouse name: Not on file  . Number of children: Not on file  . Years of education: Not on file  . Highest education level: Not on file  Occupational History  . Not on file  Social Needs  . Financial resource strain: Not on file  . Food insecurity:    Worry: Not on file    Inability: Not on file  . Transportation needs:    Medical: Not on file    Non-medical: Not on file  Tobacco Use  . Smoking status: Never Smoker  . Smokeless tobacco: Never Used  Substance and Sexual Activity  . Alcohol use: Yes    Comment: occasional/social on weekend  . Drug use: No  . Sexual activity: Never  Lifestyle  . Physical activity:     Days per week: Not on file    Minutes per session: Not on file  . Stress: Not on file  Relationships  . Social connections:    Talks on phone: Not on file    Gets together: Not on file    Attends religious service: Not on file    Active member of club or organization: Not on file    Attends meetings of clubs or organizations: Not on file    Relationship status: Not on file  . Intimate partner violence:    Fear of current or ex partner: Not on file    Emotionally abused: Not on file    Physically abused: Not on file    Forced sexual activity: Not on file  Other Topics Concern  . Not on file  Social History Narrative  . Not on file    Family History:  Family History  Problem Relation Age of Onset  . Heart disease Mother   . Heart attack Mother   . Heart disease Father   . Heart  attack Father   . Cancer Brother        kidney    Medications:   Current Outpatient Medications on File Prior to Visit  Medication Sig Dispense Refill  . acetaminophen (TYLENOL) 325 MG tablet Take 2 tablets (650 mg total) by mouth every 4 (four) hours as needed for mild pain (or temp > 37.5 C (99.5 F)). 30 tablet 1  . amLODipine (NORVASC) 10 MG tablet Take 1 tablet (10 mg total) by mouth daily. 30 tablet 1  . aspirin EC 81 MG tablet Take 1 tablet (81 mg total) by mouth daily. With food 30 tablet 2  . carvedilol (COREG) 12.5 MG tablet Take 1 tablet (12.5 mg total) by mouth 2 (two) times daily with a meal. 60 tablet 1  . clopidogrel (PLAVIX) 75 MG tablet Take 1 tablet (75 mg total) by mouth daily. Hold until 10/08/17 30 tablet 1  . Cyanocobalamin (VITAMIN B-12 PO) Take 1 tablet by mouth daily.    . dorzolamide-timolol (COSOPT) 22.3-6.8 MG/ML ophthalmic solution Place 1 drop into the left eye 2 (two) times daily.     . hydrochlorothiazide (HYDRODIURIL) 25 MG tablet Take 0.5 tablets (12.5 mg total) by mouth every morning. 30 tablet 1  . HYDROcodone-acetaminophen (NORCO/VICODIN) 5-325 MG tablet Take 1  tablet by mouth every 4 (four) hours as needed for moderate pain or severe pain. 12 tablet 0  . insulin aspart (NOVOLOG) 100 UNIT/ML injection Inject 8 Units into the skin 2 (two) times daily before a meal.    . levothyroxine (SYNTHROID, LEVOTHROID) 150 MCG tablet Take 1 tablet (150 mcg total) by mouth daily. 30 tablet 2  . lovastatin (MEVACOR) 10 MG tablet Take 2 tablets (20 mg total) by mouth at bedtime. 60 tablet 0  . metFORMIN (GLUCOPHAGE) 1000 MG tablet Take 1 tablet (1,000 mg total) by mouth 2 (two) times daily with a meal. 60 tablet 2  . methocarbamol (ROBAXIN) 500 MG tablet Take 1 tablet (500 mg total) by mouth 3 (three) times daily. For Muscle spasm 90 tablet 1  . ondansetron (ZOFRAN) 4 MG tablet Take 1 tablet (4 mg total) by mouth every 6 (six) hours as needed for nausea or vomiting. 20 tablet 0  . prednisoLONE acetate (PRED FORTE) 1 % ophthalmic suspension Place 1 drop into the left eye 2 (two) times daily.    Marland Kitchen senna-docusate (SENOKOT-S) 8.6-50 MG tablet Take 2 tablets by mouth 2 (two) times daily. 120 tablet 1   No current facility-administered medications on file prior to visit.     Allergies:   Allergies  Allergen Reactions  . Lisinopril Swelling    FACE THROAT " Breathing not affected per patient."     Physical Exam  Vitals:   10/16/17 0851  BP: 133/73  Pulse: 71  Weight: 140 lb 6.4 oz (63.7 kg)  Height: 5\' 5"  (1.651 m)   Body mass index is 23.36 kg/m. No exam data present  General: well developed, well nourished, pleasant elderly African-American female, seated, in no evident distress Head: head normocephalic and atraumatic.   Neck: supple with no carotid or supraclavicular bruits Cardiovascular: regular rate and rhythm, no murmurs Musculoskeletal: no deformity Skin:  no rash/petichiae Vascular:  Normal pulses all extremities  Neurologic Exam Mental Status: Awake and fully alert. Oriented to place and time. Recent and remote memory intact. Attention span,  concentration and fund of knowledge appropriate. Mood and affect appropriate.  Cranial Nerves: Fundoscopic exam reveals sharp disc margins. Pupils equal, briskly reactive to light.  Extraocular movements full without nystagmus. Visual fields full to confrontation. Hearing intact. Facial sensation intact. Face, tongue, palate moves normally and symmetrically.  Motor: Normal bulk and tone. Normal strength in all tested extremity muscles except for mild bilateral hip flexor weakness Sensory.: intact to touch , pinprick , position and vibratory sensation.  Coordination: Rapid alternating movements normal in all extremities. Finger-to-nose and heel-to-shin performed accurately bilaterally. Gait and Station: Arises from chair without difficulty. Stance is normal. Gait demonstrates normal stride length and balance with assistance of rolling walker. Reflexes: 1+ and symmetric. Toes downgoing.    NIHSS  0 Modified Rankin  2    Diagnostic Data (Labs, Imaging, Testing)   Ct Head Code Stroke Wo Contrast  08/22/2017 IMPRESSION:  1. No acute hemorrhage.  2. Age-indeterminate small vessel infarct of the right thalamus. MRI could be obtained for better temporal characterization, if indicated.  3. ASPECTS is 10.   MRI / MRA Head Wo Contrast 08/23/2017 IMPRESSION: 1. No acute intracranial abnormality to account for the patient's symptoms. 2. Diffuse white matter changes are mildly advanced for age. This likely reflects the sequela of chronic microvascular ischemia. 3. MRA circle-of-Willis demonstrates a high-grade stenosis in the superior left P3 segment. 4. Other more distal small vessel disease without significant proximal stenosis, aneurysm, or branch vessel occlusion otherwise. 5. Loss of normal T1 marrow signal at the dens. While this is most likely degenerative, trauma or neoplasm is not excluded on the basis of these films. Recommend non emergent follow-up CT scan of the cervical spine for further  evaluation.  Transthoracic Echocardiogram  08/23/2017 Study Conclusions - Left ventricle: The cavity size was normal. Wall thickness was normal. Systolic function was normal. The estimated ejection fraction was in the range of 60% to 65%. Wall motion was normal; there were no regional wall motion abnormalities. Doppler parameters are consistent with abnormal left ventricular relaxation (grade 1 diastolic dysfunction). The E/e&' ratio is between 8-15, suggesting indeterminate LV filling pressure. - Mitral valve: Mildly thickened leaflets . There was trivial regurgitation. - Left atrium: The atrium was normal in size. - Atrial septum: No defect or patent foramen ovale was identified. - Inferior vena cava: The vessel was normal in size. The respirophasic diameter changes were in the normal range (>= 50%), consistent with normal central venous pressure. Impressions: - LVEF 60-65%, normal wall thickness, normal wall motion, grade 1 DD, indeterminate LV filling pressure, trivial MR, normal LA size, negative for PFO by color doppler, normal IVC.  Bilateral Carotid Dopplers  Final Interpretation: Right Carotid: Velocities in the right ICA are consistent with a 1-39% stenosis. Left Carotid: Velocities in the left ICA are consistent with a 1-39% stenosis Vertebrals: Bilateral vertebral arteries demonstrate antegrade flow. Subclavians: Normal flow hemodynamics were seen in bilateral subclavian       arteries.  MR BRAIN WO CONTRAST MR MRA HEAD WO CONTRAST 09/15/2017 IMPRESSION: MRI head: 1. Stable mild siderosis within the sylvian fissures. Newly apparent tiny foci of blood products within the occipital horns of lateral ventricles and fourth ventricle are probably due to differences in technique with the current SWAN being more sensitive than the prior SWI. New hemorrhage is less likely. 2. No stroke, mass effect, or herniation. 3. Stable mild chronic  microvascular ischemic changes and volume loss of the brain. MRA head: 1. Stable left P3 superior division severe stenosis. 2. No large vessel occlusion or new segment of significant stenosis is identified. 3. 2 mm left paraophthalmic ICA inferiorly directed outpouching may represent a  tiny aneurysm or the infundibular origin of a diminutive vessel.  MR CERVICAL SPINE WO CONTRAST 09/16/2017 IMPRESSION: 1. Degenerative change of the cervical spine without fracture or malalignment. 2. C4-5 through C6-7 myelomalacia and possible cord edema. 3. Severe canal stenosis C5-6 with cord compression. Moderate canal stenosis C3-4, C4-5 and C6-7. 4. Neural foraminal narrowing all cervical levels: Severe from C3-4 to C5-6.   ASSESSMENT: Priscilla Tran is a 82 y.o. year old female here with TIA on 08/22/2017 secondary to small vessel disease. Vascular risk factors include HTN, HLD and DM.  Patient return to ED with recurrent left-sided weakness on 09/15/2017 and was found to have cervical cord compression and underwent cervical discectomy on 09/21/2017.  Patient is being seen today for hospital follow-up and overall has been recovering well with only mild residual left hip flexor weakness (right hip flexor weakness also noted) and left foot pain with numbness and tingling.    PLAN: -Continue clopidogrel 75 mg daily  and lovastatin 20 mg for secondary stroke prevention -refills provided and request future refills provided by PCP -Advised to stop aspirin at this time as 3-week DAPT completed  -F/u with PCP regarding your HLD, HTN and DM management -Advised patient that if she obtains abnormal BG reading in the future, to recheck immediately after to ensure accuracy of bleeding.  Advised that if she does obtain repeat reading of abnormal level, to contact PCP office or be seen in the ED for further management -Continue physical therapy and advised daughter and patient that if additional therapy was needed,  she can contact office for additional orders -continue to monitor BP at home -advised to continue to stay active and maintain a healthy diet -Maintain strict control of hypertension with blood pressure goal below 130/90, diabetes with hemoglobin A1c goal below 6.5% and cholesterol with LDL cholesterol (bad cholesterol) goal below 70 mg/dL. I also advised the patient to eat a healthy diet with plenty of whole grains, cereals, fruits and vegetables, exercise regularly and maintain ideal body weight.  Follow up in 3 months or call earlier if needed   Greater than 50% of time during this 25 minute visit was spent on counseling,explanation of diagnosis of TIA, reviewing risk factor management of HLD, HTN and DM, planning of further management, discussion with patient and family and coordination of care    George Hugh, AGNP-BC  [facility] [address] [place], [place] [address]  Phone [phone] Fax [phone] Note: This document was prepared with digital dictation and possible smart phrase technology. Any transcriptional errors that result from this process are unintentional.

## 2018-01-15 ENCOUNTER — Ambulatory Visit: Payer: PRIVATE HEALTH INSURANCE | Admitting: Adult Health

## 2018-12-16 ENCOUNTER — Other Ambulatory Visit: Payer: Self-pay | Admitting: Adult Health

## 2019-02-19 ENCOUNTER — Ambulatory Visit: Payer: Self-pay

## 2019-02-19 ENCOUNTER — Ambulatory Visit: Payer: Medicare HMO | Attending: Internal Medicine

## 2019-02-19 DIAGNOSIS — Z23 Encounter for immunization: Secondary | ICD-10-CM | POA: Insufficient documentation

## 2019-02-19 NOTE — Progress Notes (Signed)
   Covid-19 Vaccination Clinic  Name:  Priscilla Tran    MRN: [id number] DOB: [birth year]  02/19/2019  [name] was observed post Covid-19 immunization for 15 minutes without incidence. She was provided with Vaccine Information Sheet and instruction to access the V-Safe system.   [name] was instructed to call 911 with any severe reactions post vaccine: Marland Kitchen Difficulty breathing  . Swelling of your face and throat  . A fast heartbeat  . A bad rash all over your body  . Dizziness and weakness    Immunizations Administered    Name Date Dose VIS Date Route   Pfizer COVID-19 Vaccine 02/19/2019  2:11 PM 0.3 mL 01/07/2019 Intramuscular   Manufacturer: [employer], [employer]   Lot: [id number]   NDC: [address]-8

## 2019-03-12 ENCOUNTER — Ambulatory Visit: Payer: Medicare HMO | Attending: Internal Medicine

## 2019-03-12 DIAGNOSIS — Z23 Encounter for immunization: Secondary | ICD-10-CM | POA: Insufficient documentation

## 2019-03-12 NOTE — Progress Notes (Signed)
   Covid-19 Vaccination Clinic  Name:  Priscilla Tran    MRN: [id number] DOB: 07-Mar-1935  03/12/2019  [name] was observed post Covid-19 immunization for 30 minutes based on pre-vaccination screening without incidence. She was provided with Vaccine Information Sheet and instruction to access the V-Safe system.   [name] was instructed to call 911 with any severe reactions post vaccine: Marland Kitchen Difficulty breathing  . Swelling of your face and throat  . A fast heartbeat  . A bad rash all over your body  . Dizziness and weakness    Immunizations Administered    Name Date Dose VIS Date Route   Pfizer COVID-19 Vaccine 03/12/2019 11:34 AM 0.3 mL 01/07/2019 Intramuscular   Manufacturer: [employer], [employer]   Lot: [id number]   NDC: [address]-4

## 2019-04-30 ENCOUNTER — Emergency Department (HOSPITAL_COMMUNITY): Payer: Medicare HMO

## 2019-04-30 ENCOUNTER — Other Ambulatory Visit: Payer: Self-pay

## 2019-04-30 ENCOUNTER — Emergency Department (HOSPITAL_COMMUNITY)
Admission: EM | Admit: 2019-04-30 | Discharge: 2019-04-30 | Disposition: A | Discharge status: Home / Self Care | Payer: Medicare HMO | Attending: Emergency Medicine | Admitting: Emergency Medicine

## 2019-04-30 DIAGNOSIS — Y92000 Kitchen of unspecified non-institutional (private) residence as  the place of occurrence of the external cause: Secondary | ICD-10-CM | POA: Insufficient documentation

## 2019-04-30 DIAGNOSIS — Z794 Long term (current) use of insulin: Secondary | ICD-10-CM | POA: Insufficient documentation

## 2019-04-30 DIAGNOSIS — E039 Hypothyroidism, unspecified: Secondary | ICD-10-CM | POA: Insufficient documentation

## 2019-04-30 DIAGNOSIS — I10 Essential (primary) hypertension: Secondary | ICD-10-CM | POA: Diagnosis not present

## 2019-04-30 DIAGNOSIS — M25561 Pain in right knee: Secondary | ICD-10-CM | POA: Diagnosis not present

## 2019-04-30 DIAGNOSIS — Y93G1 Activity, food preparation and clean up: Secondary | ICD-10-CM | POA: Diagnosis not present

## 2019-04-30 DIAGNOSIS — M25551 Pain in right hip: Secondary | ICD-10-CM | POA: Diagnosis not present

## 2019-04-30 DIAGNOSIS — S0990XA Unspecified injury of head, initial encounter: Secondary | ICD-10-CM | POA: Insufficient documentation

## 2019-04-30 DIAGNOSIS — E119 Type 2 diabetes mellitus without complications: Secondary | ICD-10-CM | POA: Insufficient documentation

## 2019-04-30 DIAGNOSIS — Y999 Unspecified external cause status: Secondary | ICD-10-CM | POA: Diagnosis not present

## 2019-04-30 DIAGNOSIS — W01198A Fall on same level from slipping, tripping and stumbling with subsequent striking against other object, initial encounter: Secondary | ICD-10-CM | POA: Insufficient documentation

## 2019-04-30 DIAGNOSIS — W19XXXA Unspecified fall, initial encounter: Secondary | ICD-10-CM

## 2019-04-30 MED ORDER — LIDOCAINE 5 % EX PTCH
1.0000 | MEDICATED_PATCH | CUTANEOUS | Status: DC
  Administered 2019-04-30: 1 via TRANSDERMAL
  Filled 2019-04-30: qty 1

## 2019-04-30 MED ORDER — ACETAMINOPHEN 500 MG PO TABS
1000.0000 mg | ORAL_TABLET | Freq: Once | ORAL | Status: AC
  Administered 2019-04-30: 1000 mg via ORAL
  Filled 2019-04-30: qty 2

## 2019-04-30 NOTE — ED Notes (Signed)
Pt states that she was bending down to wipe the floor when her knee ""gave out" last night. causing pt to fall forward and strike head on metal birdcage and fall back on right hip and knee. Pt presents with mild swelling and brusing to left forehead, swollen and bruised right knee, right hip pain. Pt states that she usually uses a cane and has had to rely on a walker since the fall.

## 2019-04-30 NOTE — ED Triage Notes (Signed)
Per patient, she slipped in kitchen last night and hit head on birdcage, landed on right hip. Patient reports pain in right hip, knee.

## 2019-04-30 NOTE — ED Provider Notes (Signed)
[facility]-EMERGENCY DEPT Provider Note   CSN: [id number] Arrival date & time: 04/30/19  1028     History Chief Complaint  Patient presents with  . Fall  . Hip Pain    Priscilla Tran is a 84 y.o. female.  Priscilla Tran is a 84 y.o. female with a history of hypertension, diabetes, hyperlipidemia, glaucoma, TIA, who presents to the ED for evaluation after a mechanical fall.  Patient states that last night she was cooking in the kitchen when she spilled some flower on the floor.  She bent down to wipe up the flower and fell forward striking the front of her head on a metal bird cage and then she fell onto her right side landing on her right hip and knee.  Her daughter came to help her get up and since then she has been able to bear weight but states that she has had to use a walker, normally she is able to ambulate with a cane.  She denies losing consciousness after head she hit her head and initially had a knot on her head, but her daughters applied ice to this area and it is since gone down.  She denies any vision changes, nausea or vomiting, no dizziness.  No numbness tingling or weakness in her extremities.  No pain or injury to her chest, no shortness of breath or abdominal pain.  She took some ibuprofen last night, no other meds prior to arrival, no other aggravating or alleviating factors.        Past Medical History:  Diagnosis Date  . Diabetes mellitus   . Disc degeneration, lumbar    Dr. Regino Schultze  . Glaucoma associated with ocular inflammation, severe stage    blind Right eye  . Hyperlipidemia   . Hypertension   . Hypothyroid   . SBO (small bowel obstruction) s/p lap LOA [id number] 03/31/2011    Patient Active Problem List   Diagnosis Date Noted  . TIA (transient ischemic attack) 09/22/2017  . Cervical spinal cord compression (HCC) 09/15/2017  . Left-sided weakness 08/22/2017  . Chronic constipation 05/07/2011  . Diabetes type 2, controlled (HCC)  03/31/2011  . Hypothyroid   . Glaucoma associated with ocular inflammation, severe stage   . Disc degeneration, lumbar   . Hypertension     Past Surgical History:  Procedure Laterality Date  . ANTERIOR CERVICAL DECOMPRESSION/DISCECTOMY FUSION 4 LEVELS N/A 09/21/2017   Procedure: Cervical three-four, Cervical four-five, Cervical five-six, Cervical six-seven Anterior Cervical Decompression Fusion;  Surgeon: Julio Sicks, MD;  Location: [facility] OR;  Service: Neurosurgery;  Laterality: N/A;  . EYE SURGERY  2008   glaucoma right eye. Per patient she has had multiple surgeries on right eye, resulting in scar tissue.  Marland Kitchen LAPAROSCOPY  04/04/2011   Procedure: LAPAROSCOPY DIAGNOSTIC;  Surgeon: Ardeth Sportsman, MD;  Location: [facility] ORS;  Service: General;  Laterality: N/A;  . NM MYOCAR PERF WALL MOTION  06/20/2010   Normal  . US ECHOCARDIOGRAPHY  05/06/2005   Mild MR, PI     OB History   No obstetric history on file.     Family History  Problem Relation Age of Onset  . Heart disease Mother   . Heart attack Mother   . Heart disease Father   . Heart attack Father   . Cancer Brother        kidney    Social History   Tobacco Use  . Smoking status: Never Smoker  . Smokeless tobacco: Never  Used  Substance Use Topics  . Alcohol use: Yes    Comment: occasional/social on weekend  . Drug use: No    Home Medications Prior to Admission medications   Medication Sig Start Date End Date Taking? Authorizing Provider  acetaminophen (TYLENOL) 325 MG tablet Take 2 tablets (650 mg total) by mouth every 4 (four) hours as needed for mild pain (or temp > 37.5 C (99.5 F)). 09/23/17   Emokpae, Courage, MD  amLODipine (NORVASC) 10 MG tablet Take 1 tablet (10 mg total) by mouth daily. 09/23/17   Shon HaleEmokpae, Courage, MD  carvedilol (COREG) 12.5 MG tablet Take 1 tablet (12.5 mg total) by mouth 2 (two) times daily with a meal. 09/23/17   Emokpae, Courage, MD  clopidogrel (PLAVIX) 75 MG tablet TAKE 1 TABLET BY MOUTH DAILY  (HOLD UNTIL 10/08/17) 12/21/18   Ihor AustinMcCue, Jessica, NP  Cyanocobalamin (VITAMIN B-12 PO) Take 1 tablet by mouth daily.    [provider]  dorzolamide-timolol (COSOPT) 22.3-6.8 MG/ML ophthalmic solution Place 1 drop into the left eye 2 (two) times daily.     [provider]  hydrochlorothiazide (HYDRODIURIL) 25 MG tablet Take 0.5 tablets (12.5 mg total) by mouth every morning. 09/23/17   Shon HaleEmokpae, Courage, MD  HYDROcodone-acetaminophen (NORCO/VICODIN) 5-325 MG tablet Take 1 tablet by mouth every 4 (four) hours as needed for moderate pain or severe pain. 09/23/17   Shon HaleEmokpae, Courage, MD  insulin aspart (NOVOLOG) 100 UNIT/ML injection Inject 8 Units into the skin 2 (two) times daily before a meal.    [provider]  latanoprost (XALATAN) 0.005 % ophthalmic solution Place 1 drop into the left eye at bedtime. 04/08/19   [provider]  levothyroxine (SYNTHROID, LEVOTHROID) 150 MCG tablet Take 1 tablet (150 mcg total) by mouth daily. 09/23/17   Shon HaleEmokpae, Courage, MD  lovastatin (MEVACOR) 20 MG tablet Take 1 tablet (20 mg total) by mouth at bedtime. 10/16/17   Ihor AustinMcCue, Jessica, NP  metFORMIN (GLUCOPHAGE) 1000 MG tablet Take 1 tablet (1,000 mg total) by mouth 2 (two) times daily with a meal. 09/23/17   Emokpae, Courage, MD  methocarbamol (ROBAXIN) 500 MG tablet Take 1 tablet (500 mg total) by mouth 3 (three) times daily. For Muscle spasm 09/23/17   Shon HaleEmokpae, Courage, MD  ondansetron (ZOFRAN) 4 MG tablet Take 1 tablet (4 mg total) by mouth every 6 (six) hours as needed for nausea or vomiting. 09/23/17   Shon HaleEmokpae, Courage, MD  prednisoLONE acetate (PRED FORTE) 1 % ophthalmic suspension Place 1 drop into the left eye 2 (two) times daily.    [provider]    Allergies    Lisinopril  Review of Systems   Review of Systems  Constitutional: Negative for chills and fever.  Eyes: Negative for visual disturbance.  Respiratory: Negative for shortness of breath.   Cardiovascular:  Negative for chest pain.  Gastrointestinal: Negative for abdominal pain, nausea and vomiting.  Musculoskeletal: Positive for arthralgias and joint swelling. Negative for back pain and neck pain.  Skin: Negative for color change and wound.  Neurological: Negative for weakness and numbness.    Physical Exam Updated Vital Signs BP (!) 166/74 (BP Location: Left Arm)   Pulse 74   Temp 98.8 F (37.1 C) (Oral)   Resp 16   Ht 5' 4.5" (1.638 m)   Wt 65.3 kg   SpO2 99%   BMI 24.34 kg/m   Physical Exam Vitals and nursing note reviewed.  Constitutional:      General: She is not in  acute distress.    Appearance: Normal appearance. She is well-developed and normal weight. She is not diaphoretic.  HENT:     Head: Normocephalic.     Comments: Small area of redness to the left forehead where daughter reports there was previously had not, that resolved with ice, no step-off or deformity, no other hematomas noted, neg battle sign Eyes:     General:        Right eye: No discharge.        Left eye: No discharge.     Pupils: Pupils are equal, round, and reactive to light.  Cardiovascular:     Rate and Rhythm: Normal rate and regular rhythm.     Heart sounds: Normal heart sounds. No murmur. No friction rub. No gallop.   Pulmonary:     Effort: Pulmonary effort is normal. No respiratory distress.     Breath sounds: Normal breath sounds. No wheezing or rales.     Comments: Respirations equal and unlabored, patient able to speak in full sentences, lungs clear to auscultation bilaterally, no chest wall tenderness, no crepitus or deformity Chest:     Chest wall: No tenderness.  Abdominal:     General: Bowel sounds are normal. There is no distension.     Palpations: Abdomen is soft. There is no mass.     Tenderness: There is no abdominal tenderness. There is no guarding.     Comments: Abdomen soft, nondistended, nontender to palpation in all quadrants without guarding or peritoneal signs    Musculoskeletal:        General: Swelling and tenderness present. No deformity.     Cervical back: Neck supple.     Comments: Mild tenderness over the lateral right hip, no palpable deformity, no ecchymosis or erythema.  Patient is able to fully range the hip with mild discomfort.  Patient also has pain over the right knee with palpable effusion there is slight bruising over the patella, able to flex and extend with some discomfort.  Distal pulses 2+, 5/5 strength with dorsi or plantar flexion  Skin:    General: Skin is warm and dry.     Capillary Refill: Capillary refill takes less than 2 seconds.  Neurological:     Mental Status: She is alert.     Coordination: Coordination normal.     Comments: Speech is clear, able to follow commands CN III-XII intact Normal strength in upper and lower extremities bilaterally including dorsiflexion and plantar flexion, strong and equal grip strength Sensation normal to light and sharp touch Moves extremities without ataxia, coordination intact  Psychiatric:        Mood and Affect: Mood normal.        Behavior: Behavior normal.     ED Results / Procedures / Treatments   Labs (all labs ordered are listed, but only abnormal results are displayed) Labs Reviewed - No data to display  EKG None  Radiology CT Head Wo Contrast  Result Date: 04/30/2019 CLINICAL DATA:  Minor head trauma EXAM: CT HEAD WITHOUT CONTRAST TECHNIQUE: Contiguous axial images were obtained from the base of the skull through the vertex without intravenous contrast. COMPARISON:  09/15/2017 FINDINGS: Brain: No evidence of acute infarction, hemorrhage, hydrocephalus, extra-axial collection or mass lesion/mass effect. Generalized atrophy. Minor for age chronic small vessel ischemia. Vascular: No hyperdense vessel or unexpected calcification. Skull: Negative for skull fracture. Partial coverage of C2-3 degenerative disease. Sinuses/Orbits: Bilateral glaucoma reservoir and right-sided  scleral band. No evidence of injury. Partial left mastoid  opacification. IMPRESSION: No evidence of intracranial injury. Electronically Signed   By: Marnee Spring M.D.   On: 04/30/2019 12:46   CT Knee Right Wo Contrast  Result Date: 04/30/2019 CLINICAL DATA:  Slipped and fell. Severe knee pain. Negative x-rays. EXAM: CT OF THE right KNEE WITHOUT CONTRAST TECHNIQUE: Multidetector CT imaging of the right knee was performed according to the standard protocol. Multiplanar CT image reconstructions were also generated. COMPARISON:  Radiographs, same date. FINDINGS: Severe tricompartmental degenerative changes with joint space narrowing, osteophytic spurring and subchondral cystic change. There are loose ossified bodies in the joint and a large joint effusion. No acute fractures identified. No osteochondral lesions. There is fairly extensive chondrocalcinosis which may suggest CPPD arthropathy. Difficult to assess the cruciate ligaments. Grossly by CT the medial and lateral collateral ligaments are intact and the quadriceps and patellar tendons are intact. IMPRESSION: 1. Severe tricompartmental degenerative changes with loose ossified bodies in the joint and a large joint effusion. 2. No acute fracture. 3. Extensive chondrocalcinosis suggesting CPPD arthropathy. Electronically Signed   By: Rudie Meyer M.D.   On: 04/30/2019 13:51   DG Knee Complete 4 Views Right  Result Date: 04/30/2019 CLINICAL DATA:  Slip and fall, pain EXAM: RIGHT KNEE - COMPLETE 4+ VIEW COMPARISON:  12/31/2018 FINDINGS: No fracture or dislocation of the right knee. Redemonstrated severe tricompartmental arthrosis. Loose bodies are present in the joint recesses. Small nonspecific knee joint effusion. Soft tissues are unremarkable. IMPRESSION: 1. No fracture or dislocation of the right knee. Redemonstrated severe tricompartmental arthrosis. 2.  Small, nonspecific knee joint effusion. Electronically Signed   By: Lauralyn Primes M.D.   On: 04/30/2019  12:39   DG Hip Unilat W or Wo Pelvis 2-3 Views Right  Result Date: 04/30/2019 CLINICAL DATA:  Pt reported she slipped in the kitchen last night and hit her head on a birdcage, landing on her right hip. Patient reported right lateral hip pain and right knee pain to the medial aspect with swelling and bruising. EXAM: DG HIP (WITH OR WITHOUT PELVIS) 2-3V RIGHT COMPARISON:  10/12/2015 FINDINGS: No fracture or bone lesion. Hip joints, SI joints and symphysis pubis normally aligned. No significant arthropathic change. Skeletal structures are demineralized. Soft tissues are unremarkable. IMPRESSION: 1. No fracture, bone lesion or significant hip joint abnormality. Electronically Signed   By: Amie Portland M.D.   On: 04/30/2019 12:39    Procedures Procedures (including critical care time)  Medications Ordered in ED Medications  lidocaine (LIDODERM) 5 % 1 patch (1 patch Transdermal Patch Applied 04/30/19 1419)  acetaminophen (TYLENOL) tablet 1,000 mg (1,000 mg Oral Given 04/30/19 1251)    ED Course  I have reviewed the triage vital signs and the nursing notes.  Pertinent labs & imaging results that were available during my care of the patient were reviewed by me and considered in my medical decision making (see chart for details).    MDM Rules/Calculators/A&P                     84 year old female presents to ED after mechanical fall, she hit her head on a birdcage, and landed on her right hip and knee.  Mildly hypertensive but vitals otherwise normal.  She has a small area of redness to the left forehead on exam without significant deformity.  Normal neurologic exam.  She has mild tenderness to the lateral right hip and there is tenderness swelling and bruising to the right knee.  Will get CT of the head and  plain films of the right hip and knee.  No tenderness or signs of trauma elsewhere on exam.  Head CT shows no evidence of intracranial injury.  Knee films showed no fracture, patient does have  severe tricompartmental arthritis and a small knee effusion noted.  X-rays of the right hip and pelvis show no evidence of fracture.  I discussed these results with patient and daughter, patient is able to range the hip with minimal discomfort and is able to scoot herself up in bed I have low suspicion for acute hip fracture but given effusion and swelling to the knee with pain with range of motion will get CT to rule out occult fracture.  Knee CT again demonstrates severe tricompartmental degenerative changes with some loose ossified bodies in the joint and a large joint effusion but there is no acute fracture she also has extensive chondrocalcinosis.  Discussed these results with patient and daughter.  Suspect this is just soreness and effusion from trauma.  We will have her continue to wear her knee sleeve treat with Tylenol, elevation, Lidoderm patches.  She was able to ambulate with walker here in the ED and I have encouraged her to use her walker at home until symptoms improve.  If pain worsens she should follow-up with her primary care doctor or return to the ED.  They expressed understanding and agreement.  Discharged home in good condition.  Final Clinical Impression(s) / ED Diagnoses Final diagnoses:  Fall, initial encounter  Right hip pain  Acute pain of right knee  Injury of head, initial encounter    Rx / DC Orders ED Discharge Orders    None       Legrand Rams 04/30/19 1543    Gwyneth Sprout, MD 05/01/19 [phone]

## 2019-04-30 NOTE — ED Notes (Signed)
Pt was able to ambulate with walker and bear weight on BLE at this time.

## 2019-04-30 NOTE — Discharge Instructions (Signed)
Use Tylenol 1000 mg every 6 hours, you can also use over-the-counter salon pas lidocaine patches which are applied for 12 hours and then remove for 12 hours.  Continue wearing knee sleeve, ice and elevate the knee.  Please walk using a walker for the next few days.  Follow-up with your PCP if symptoms or not improving.

## 2019-07-19 ENCOUNTER — Emergency Department (HOSPITAL_COMMUNITY): Payer: Medicare HMO

## 2019-07-19 ENCOUNTER — Observation Stay (HOSPITAL_BASED_OUTPATIENT_CLINIC_OR_DEPARTMENT_OTHER)
Admission: EM | Admit: 2019-07-19 | Discharge: 2019-07-20 | Disposition: A | Discharge status: Home / Self Care | Payer: Medicare HMO | Source: Home / Self Care | Attending: Emergency Medicine | Admitting: Emergency Medicine

## 2019-07-19 ENCOUNTER — Other Ambulatory Visit: Payer: Self-pay

## 2019-07-19 DIAGNOSIS — H42 Glaucoma in diseases classified elsewhere: Secondary | ICD-10-CM | POA: Insufficient documentation

## 2019-07-19 DIAGNOSIS — R42 Dizziness and giddiness: Secondary | ICD-10-CM | POA: Insufficient documentation

## 2019-07-19 DIAGNOSIS — Z981 Arthrodesis status: Secondary | ICD-10-CM | POA: Insufficient documentation

## 2019-07-19 DIAGNOSIS — R269 Unspecified abnormalities of gait and mobility: Secondary | ICD-10-CM | POA: Insufficient documentation

## 2019-07-19 DIAGNOSIS — I161 Hypertensive emergency: Secondary | ICD-10-CM | POA: Insufficient documentation

## 2019-07-19 DIAGNOSIS — R4182 Altered mental status, unspecified: Secondary | ICD-10-CM | POA: Diagnosis not present

## 2019-07-19 DIAGNOSIS — Z8051 Family history of malignant neoplasm of kidney: Secondary | ICD-10-CM | POA: Insufficient documentation

## 2019-07-19 DIAGNOSIS — Z20822 Contact with and (suspected) exposure to covid-19: Secondary | ICD-10-CM | POA: Insufficient documentation

## 2019-07-19 DIAGNOSIS — E785 Hyperlipidemia, unspecified: Secondary | ICD-10-CM | POA: Insufficient documentation

## 2019-07-19 DIAGNOSIS — G459 Transient cerebral ischemic attack, unspecified: Secondary | ICD-10-CM | POA: Insufficient documentation

## 2019-07-19 DIAGNOSIS — Z79899 Other long term (current) drug therapy: Secondary | ICD-10-CM | POA: Insufficient documentation

## 2019-07-19 DIAGNOSIS — R1310 Dysphagia, unspecified: Secondary | ICD-10-CM | POA: Insufficient documentation

## 2019-07-19 DIAGNOSIS — I6782 Cerebral ischemia: Secondary | ICD-10-CM | POA: Insufficient documentation

## 2019-07-19 DIAGNOSIS — Z8249 Family history of ischemic heart disease and other diseases of the circulatory system: Secondary | ICD-10-CM | POA: Insufficient documentation

## 2019-07-19 DIAGNOSIS — E1139 Type 2 diabetes mellitus with other diabetic ophthalmic complication: Secondary | ICD-10-CM | POA: Insufficient documentation

## 2019-07-19 DIAGNOSIS — E538 Deficiency of other specified B group vitamins: Secondary | ICD-10-CM | POA: Insufficient documentation

## 2019-07-19 DIAGNOSIS — I674 Hypertensive encephalopathy: Secondary | ICD-10-CM | POA: Insufficient documentation

## 2019-07-19 DIAGNOSIS — Z794 Long term (current) use of insulin: Secondary | ICD-10-CM | POA: Insufficient documentation

## 2019-07-19 DIAGNOSIS — Z888 Allergy status to other drugs, medicaments and biological substances status: Secondary | ICD-10-CM | POA: Insufficient documentation

## 2019-07-19 DIAGNOSIS — E119 Type 2 diabetes mellitus without complications: Secondary | ICD-10-CM | POA: Insufficient documentation

## 2019-07-19 DIAGNOSIS — R600 Localized edema: Secondary | ICD-10-CM | POA: Insufficient documentation

## 2019-07-19 DIAGNOSIS — H5461 Unqualified visual loss, right eye, normal vision left eye: Secondary | ICD-10-CM | POA: Insufficient documentation

## 2019-07-19 DIAGNOSIS — R4781 Slurred speech: Secondary | ICD-10-CM | POA: Insufficient documentation

## 2019-07-19 DIAGNOSIS — E039 Hypothyroidism, unspecified: Secondary | ICD-10-CM | POA: Insufficient documentation

## 2019-07-19 LAB — CBC
HCT: 39.2 % (ref 36.0–46.0)
Hemoglobin: 12.1 g/dL (ref 12.0–15.0)
MCH: 26.5 pg (ref 26.0–34.0)
MCHC: 30.9 g/dL (ref 30.0–36.0)
MCV: 86 fL (ref 80.0–100.0)
Platelets: 214 10*3/uL (ref 150–400)
RBC: 4.56 MIL/uL (ref 3.87–5.11)
RDW: 15.7 % — ABNORMAL HIGH (ref 11.5–15.5)
WBC: 7.6 10*3/uL (ref 4.0–10.5)
nRBC: 0 % (ref 0.0–0.2)

## 2019-07-19 LAB — COMPREHENSIVE METABOLIC PANEL
ALT: 11 U/L (ref 0–44)
AST: 21 U/L (ref 15–41)
Albumin: 4.2 g/dL (ref 3.5–5.0)
Alkaline Phosphatase: 65 U/L (ref 38–126)
Anion gap: 12 (ref 5–15)
BUN: 11 mg/dL (ref 8–23)
CO2: 23 mmol/L (ref 22–32)
Calcium: 9.7 mg/dL (ref 8.9–10.3)
Chloride: 102 mmol/L (ref 98–111)
Creatinine, Ser: 0.74 mg/dL (ref 0.44–1.00)
GFR calc Af Amer: >60 mL/min (ref >60)
GFR calc non Af Amer: >60 mL/min (ref >60)
Glucose, Bld: 111 mg/dL — ABNORMAL HIGH (ref 70–99)
Potassium: 4.3 mmol/L (ref 3.5–5.1)
Sodium: 137 mmol/L (ref 135–145)
Total Bilirubin: 1 mg/dL (ref 0.3–1.2)
Total Protein: 6.9 g/dL (ref 6.5–8.1)

## 2019-07-19 LAB — I-STAT CHEM 8, ED
BUN: 12 mg/dL (ref 8–23)
Calcium, Ion: 1.12 mmol/L — ABNORMAL LOW (ref 1.15–1.40)
Chloride: 100 mmol/L (ref 98–111)
Creatinine, Ser: 0.7 mg/dL (ref 0.44–1.00)
Glucose, Bld: 108 mg/dL — ABNORMAL HIGH (ref 70–99)
HCT: 39 % (ref 36.0–46.0)
Hemoglobin: 13.3 g/dL (ref 12.0–15.0)
Potassium: 4.2 mmol/L (ref 3.5–5.1)
Sodium: 137 mmol/L (ref 135–145)
TCO2: 25 mmol/L (ref 22–32)

## 2019-07-19 LAB — DIFFERENTIAL
Abs Immature Granulocytes: 0.01 10*3/uL (ref 0.00–0.07)
Basophils Absolute: 0.1 10*3/uL (ref 0.0–0.1)
Basophils Relative: 1 %
Eosinophils Absolute: 0.1 10*3/uL (ref 0.0–0.5)
Eosinophils Relative: 2 %
Immature Granulocytes: 0 %
Lymphocytes Relative: 26 %
Lymphs Abs: 2 10*3/uL (ref 0.7–4.0)
Monocytes Absolute: 0.8 10*3/uL (ref 0.1–1.0)
Monocytes Relative: 10 %
Neutro Abs: 4.6 10*3/uL (ref 1.7–7.7)
Neutrophils Relative %: 61 %

## 2019-07-19 LAB — ETHANOL: Alcohol, Ethyl (B): <10 mg/dL (ref <10)

## 2019-07-19 LAB — URINALYSIS, ROUTINE W REFLEX MICROSCOPIC
Bilirubin Urine: NEGATIVE
Glucose, UA: NEGATIVE mg/dL
Hgb urine dipstick: NEGATIVE
Ketones, ur: NEGATIVE mg/dL
Leukocytes,Ua: NEGATIVE
Nitrite: NEGATIVE
Protein, ur: NEGATIVE mg/dL
Specific Gravity, Urine: 1.004 — ABNORMAL LOW (ref 1.005–1.030)
pH: 7 (ref 5.0–8.0)

## 2019-07-19 LAB — APTT: aPTT: 32 seconds (ref 24–36)

## 2019-07-19 LAB — PROTIME-INR
INR: 1 (ref 0.8–1.2)
Prothrombin Time: 12.8 seconds (ref 11.4–15.2)

## 2019-07-19 LAB — RAPID URINE DRUG SCREEN, HOSP PERFORMED
Amphetamines: NOT DETECTED
Barbiturates: NOT DETECTED
Benzodiazepines: NOT DETECTED
Cocaine: NOT DETECTED
Opiates: NOT DETECTED
Tetrahydrocannabinol: NOT DETECTED

## 2019-07-19 LAB — CBG MONITORING, ED
Glucose-Capillary: 94 mg/dL (ref 70–99)
Glucose-Capillary: 115 mg/dL — ABNORMAL HIGH (ref 70–99)

## 2019-07-19 MED ORDER — SENNOSIDES-DOCUSATE SODIUM 8.6-50 MG PO TABS: 1.0000 | ORAL_TABLET | Freq: Every evening | ORAL | Status: DC | PRN

## 2019-07-19 MED ORDER — PRAVASTATIN SODIUM 10 MG PO TABS
20.0000 mg | ORAL_TABLET | Freq: Every day | ORAL | Status: DC
  Administered 2019-07-19: 20 mg via ORAL
  Filled 2019-07-19: qty 2

## 2019-07-19 MED ORDER — LABETALOL HCL 5 MG/ML IV SOLN
10.0000 mg | Freq: Once | INTRAVENOUS | Status: AC
  Administered 2019-07-19: 10 mg via INTRAVENOUS

## 2019-07-19 MED ORDER — STROKE: EARLY STAGES OF RECOVERY BOOK
Freq: Once | Status: AC
  Filled 2019-07-19: qty 1

## 2019-07-19 MED ORDER — ACETAMINOPHEN 325 MG PO TABS: 650.0000 mg | ORAL_TABLET | ORAL | Status: DC | PRN

## 2019-07-19 MED ORDER — CARVEDILOL 12.5 MG PO TABS
12.5000 mg | ORAL_TABLET | Freq: Two times a day (BID) | ORAL | Status: DC
  Administered 2019-07-19 – 2019-07-20 (×2): 12.5 mg via ORAL
  Filled 2019-07-19 (×2): qty 1

## 2019-07-19 MED ORDER — HYDRALAZINE HCL 25 MG PO TABS: 25.0000 mg | ORAL_TABLET | Freq: Four times a day (QID) | ORAL | Status: DC | PRN

## 2019-07-19 MED ORDER — CLOPIDOGREL BISULFATE 75 MG PO TABS
75.0000 mg | ORAL_TABLET | Freq: Every day | ORAL | Status: DC
  Administered 2019-07-20: 75 mg via ORAL
  Filled 2019-07-19: qty 1

## 2019-07-19 MED ORDER — ENOXAPARIN SODIUM 40 MG/0.4ML ~~LOC~~ SOLN
40.0000 mg | SUBCUTANEOUS | Status: DC
  Administered 2019-07-19: 40 mg via SUBCUTANEOUS
  Filled 2019-07-19 (×2): qty 0.4

## 2019-07-19 MED ORDER — AMLODIPINE BESYLATE 10 MG PO TABS
10.0000 mg | ORAL_TABLET | Freq: Every day | ORAL | Status: DC
  Administered 2019-07-20: 10 mg via ORAL
  Filled 2019-07-19: qty 1

## 2019-07-19 MED ORDER — VITAMIN B-12 100 MCG PO TABS
100.0000 ug | ORAL_TABLET | Freq: Every day | ORAL | Status: DC
  Administered 2019-07-20: 100 ug via ORAL
  Filled 2019-07-19: qty 1

## 2019-07-19 MED ORDER — BRIMONIDINE TARTRATE 0.2 % OP SOLN
1.0000 [drp] | Freq: Three times a day (TID) | OPHTHALMIC | Status: DC
  Administered 2019-07-19 – 2019-07-20 (×2): 1 [drp] via OPHTHALMIC
  Filled 2019-07-19 (×2): qty 5

## 2019-07-19 MED ORDER — PREDNISOLONE ACETATE 1 % OP SUSP
1.0000 [drp] | Freq: Three times a day (TID) | OPHTHALMIC | Status: DC
  Administered 2019-07-19 – 2019-07-20 (×2): 1 [drp] via OPHTHALMIC
  Filled 2019-07-19: qty 5

## 2019-07-19 MED ORDER — SODIUM CHLORIDE 0.9% FLUSH: 3.0000 mL | Freq: Once | INTRAVENOUS | Status: DC

## 2019-07-19 MED ORDER — LEVOTHYROXINE SODIUM 100 MCG PO TABS
100.0000 ug | ORAL_TABLET | Freq: Every day | ORAL | Status: DC
  Administered 2019-07-20: 100 ug via ORAL
  Filled 2019-07-19: qty 1

## 2019-07-19 NOTE — ED Provider Notes (Signed)
[facility] EMERGENCY DEPARTMENT Provider Note   CSN: [id number] Arrival date & time: 07/19/19  1313  An emergency department physician performed an initial assessment on this suspected stroke patient at 1304.  History No chief complaint on file.   Priscilla Tran is a 84 y.o. female.  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  Patient has had recent surgical procedure on the left eye.  Patient lives home independently.  Her daughter talked to her at 10:30 AM.  She was normal at that time.  She reports she was in speaking to her at 1130 and the patient speech was slurred and she was confused.  This is a distinct change.  Her daughter called EMS.  There were no other focal motor deficits.  No documented weakness of extremities.  Patient's daughter reports that symptoms have improved as she has been in the hospital.  At the time of my evaluation, patient is back to baseline.  Patient reports she feels normal at this time.  No weakness or numbness to her extremities that she can perceive.  No headache.  Vision is at baseline.  Patient has not recently been sick.  She did have treatment for her glaucoma on the left eye.  She does have history of prior TIA.    Past Medical History:  Diagnosis Date  . Diabetes mellitus   . Disc degeneration, lumbar    Dr. Regino Schultze  . Glaucoma associated with ocular inflammation, severe stage    blind Right eye  . Hyperlipidemia   . Hypertension   . Hypothyroid   . SBO (small bowel obstruction) s/p lap LOA [id number] 03/31/2011    Patient Active Problem List   Diagnosis Date Noted  . TIA (transient ischemic attack) 09/22/2017  . Cervical spinal cord compression (HCC) 09/15/2017  . Left-sided weakness 08/22/2017  . Chronic constipation 05/07/2011  . Diabetes type 2, controlled (HCC) 03/31/2011  . Hypothyroid   . Glaucoma associated with ocular inflammation, severe stage     . Disc degeneration, lumbar   . Hypertension     Past Surgical History:  Procedure Laterality Date  . ANTERIOR CERVICAL DECOMPRESSION/DISCECTOMY FUSION 4 LEVELS N/A 09/21/2017   Procedure: Cervical three-four, Cervical four-five, Cervical five-six, Cervical six-seven Anterior Cervical Decompression Fusion;  Surgeon: Julio Sicks, MD;  Location[facility] OR;  Service: Neurosurgery;  Laterality: N/A;  . EYE SURGERY  2008   glaucoma right eye. Per patient she has had multiple surgeries on right eye, resulting in scar tissue.  Marland Kitchen LAPAROSCOPY  04/04/2011   Procedure: LAPAROSCOPY DIAGNOSTIC;  Surgeon: Ardeth Sportsman, MD;  Location: [facility] ORS;  Service: General;  Laterality: N/A;  . NM MYOCAR PERF WALL MOTION  06/20/2010   Normal  . US ECHOCARDIOGRAPHY  05/06/2005   Mild MR, PI     OB History   No obstetric history on file.     Family History  Problem Relation Age of Onset  . Heart disease Mother   . Heart attack Mother   . Heart disease Father   . Heart attack Father   . Cancer Brother        kidney    Social History   Tobacco Use  . Smoking status: Never Smoker  . Smokeless tobacco: Never Used  Substance Use Topics  . Alcohol use: Yes    Comment: occasional/social on weekend  . Drug use: No    Home Medications Prior to  Admission medications   Medication Sig Start Date End Date Taking? Authorizing Provider  acetaminophen (TYLENOL) 325 MG tablet Take 2 tablets (650 mg total) by mouth every 4 (four) hours as needed for mild pain (or temp > 37.5 C (99.5 F)). 09/23/17  Yes Emokpae, Courage, MD  amLODipine (NORVASC) 10 MG tablet Take 1 tablet (10 mg total) by mouth daily. 09/23/17  Yes Shon Hale, MD  carvedilol (COREG) 12.5 MG tablet Take 1 tablet (12.5 mg total) by mouth 2 (two) times daily with a meal. 09/23/17  Yes Emokpae, Courage, MD  clopidogrel (PLAVIX) 75 MG tablet TAKE 1 TABLET BY MOUTH DAILY (HOLD UNTIL 10/08/17) 12/21/18  Yes McCue, Jessica, NP  Cyanocobalamin (VITAMIN B-12  PO) Take 1 tablet by mouth daily.   Yes [provider]  prednisoLONE acetate (PRED FORTE) 1 % ophthalmic suspension Place 1 drop into the left eye 3 (three) times daily.    Yes [provider]  dorzolamide-timolol (COSOPT) 22.3-6.8 MG/ML ophthalmic solution Place 1 drop into the left eye 2 (two) times daily.     [provider]  hydrochlorothiazide (HYDRODIURIL) 25 MG tablet Take 0.5 tablets (12.5 mg total) by mouth every morning. 09/23/17   Shon Hale, MD  HYDROcodone-acetaminophen (NORCO/VICODIN) 5-325 MG tablet Take 1 tablet by mouth every 4 (four) hours as needed for moderate pain or severe pain. 09/23/17   Shon Hale, MD  insulin aspart (NOVOLOG) 100 UNIT/ML injection Inject 8 Units into the skin 2 (two) times daily before a meal.    [provider]  latanoprost (XALATAN) 0.005 % ophthalmic solution Place 1 drop into the left eye at bedtime. 04/08/19   [provider]  levothyroxine (SYNTHROID, LEVOTHROID) 150 MCG tablet Take 1 tablet (150 mcg total) by mouth daily. 09/23/17   Shon Hale, MD  lovastatin (MEVACOR) 20 MG tablet Take 1 tablet (20 mg total) by mouth at bedtime. 10/16/17   Ihor Austin, NP  metFORMIN (GLUCOPHAGE) 1000 MG tablet Take 1 tablet (1,000 mg total) by mouth 2 (two) times daily with a meal. 09/23/17   Emokpae, Courage, MD  methocarbamol (ROBAXIN) 500 MG tablet Take 1 tablet (500 mg total) by mouth 3 (three) times daily. For Muscle spasm 09/23/17   Shon Hale, MD  ondansetron (ZOFRAN) 4 MG tablet Take 1 tablet (4 mg total) by mouth every 6 (six) hours as needed for nausea or vomiting. 09/23/17   Shon Hale, MD    Allergies    Lisinopril  Review of Systems   Review of Systems 10 systems reviewed and negative except as per HPI Physical Exam Updated Vital Signs BP (!) 165/81   Pulse 72   Temp 99.5 F (37.5 C) (Oral)   Resp 20   Ht 5' 4.5" (1.638 m)   Wt 66.5 kg   SpO2 100%   BMI 24.78 kg/m    Physical Exam Constitutional:      Comments: Alert nontoxic answering questions.  GCS 15.  No respiratory distress.  HENT:     Head: Normocephalic and atraumatic.     Ears:     Comments: Right eye opacified cornea.  Left eye roughness and irregularity of cornea but clear.  Some diffuse injection of the sclera.    Mouth/Throat:     Mouth: Mucous membranes are moist.  Cardiovascular:     Rate and Rhythm: Normal rate and regular rhythm.     Pulses: Normal pulses.     Heart sounds: Normal heart sounds.  Pulmonary:     Effort:  Pulmonary effort is normal.     Breath sounds: Normal breath sounds.  Abdominal:     General: There is no distension.     Palpations: Abdomen is soft.     Tenderness: There is no abdominal tenderness. There is no guarding.  Musculoskeletal:        General: No swelling or tenderness. Normal range of motion.     Cervical back: Neck supple.     Right lower leg: No edema.     Left lower leg: No edema.  Skin:    General: Skin is warm and dry.  Neurological:     Comments: At the time my evaluation, patient is alert and appropriate.  She is answering questions with good recall.  Speech is clear without slurring.  5\5 strength for grip strength, 5\5 motor strength for lower extremities resisting downward pressure for gravity and dorsiflexion plantarflexion.  No endorsement of differential for light touch  Psychiatric:        Mood and Affect: Mood normal.     ED Results / Procedures / Treatments   Labs (all labs ordered are listed, but only abnormal results are displayed) Labs Reviewed  CBC - Abnormal; Notable for the following components:      Result Value   RDW 15.7 (*)    All other components within normal limits  COMPREHENSIVE METABOLIC PANEL - Abnormal; Notable for the following components:   Glucose, Bld 111 (*)    All other components within normal limits  I-STAT CHEM 8, ED - Abnormal; Notable for the following components:   Glucose, Bld 108 (*)     Calcium, Ion 1.12 (*)    All other components within normal limits  ETHANOL  PROTIME-INR  APTT  DIFFERENTIAL  RAPID URINE DRUG SCREEN, HOSP PERFORMED  URINALYSIS, ROUTINE W REFLEX MICROSCOPIC  CBG MONITORING, ED    EKG EKG Interpretation  Date/Time:  Tuesday July 19 2019 13:33:23 EDT Ventricular Rate:  61 PR Interval:    QRS Duration: 96 QT Interval:  416 QTC Calculation: 419 R Axis:   -59 Text Interpretation: Sinus rhythm Left anterior fascicular block poor r wave progrssion, otherwise no sig change. no acute ischemic appearance Confirmed by Charlesetta Shanks [phone]) on 07/19/2019 3:00:26 PM   Radiology CT HEAD CODE STROKE WO CONTRAST  Result Date: 07/19/2019 CLINICAL DATA:  Code stroke.  Confusion and slurred speech EXAM: CT HEAD WITHOUT CONTRAST TECHNIQUE: Contiguous axial images were obtained from the base of the skull through the vertex without intravenous contrast. COMPARISON:  04/30/2019 FINDINGS: Brain: There is no acute intracranial hemorrhage, mass effect, or edema. Gray-white differentiation is preserved. There is no extra-axial fluid collection. Ventricles and sulci are within normal limits in size and configuration. Patchy hypoattenuation in the supratentorial white matter is nonspecific but probably reflects stable chronic microvascular ischemic changes Vascular: No hyperdense vessel or unexpected calcification. Skull: Calvarium is unremarkable. Sinuses/Orbits: No acute finding. Other: None. ASPECTS ([place] Stroke Program Early CT Score) - Ganglionic level infarction (caudate, lentiform nuclei, internal capsule, insula, M1-M3 cortex): 7 - Supraganglionic infarction (M4-M6 cortex): 3 Total score (0-10 with 10 being normal): 10 IMPRESSION: No acute intracranial hemorrhage or evidence of acute infarction. ASPECTS is 10. Stable chronic microvascular ischemic changes. These results were communicated to Dr. Lorraine Lax at 1:25 pmon 6/22/2021by text page via the [facility] messaging system.  Electronically Signed   By: Macy Mis M.D.   On: 07/19/2019 13:27    Procedures Procedures (including critical care time) CRITICAL CARE Performed by: Charlesetta Shanks  Total critical care time: 30 minutes  Critical care time was exclusive of separately billable procedures and treating other patients.  Critical care was necessary to treat or prevent imminent or life-threatening deterioration.  Critical care was time spent personally by me on the following activities: development of treatment plan with patient and/or surrogate as well as nursing, discussions with consultants, evaluation of patient's response to treatment, examination of patient, obtaining history from patient or surrogate, ordering and performing treatments and interventions, ordering and review of laboratory studies, ordering and review of radiographic studies, pulse oximetry and re-evaluation of patient's condition. Medications Ordered in ED Medications  sodium chloride flush (NS) 0.9 % injection 3 mL (3 mLs Intravenous Not Given 07/19/19 1339)  labetalol (NORMODYNE) injection 10 mg (10 mg Intravenous Given 07/19/19 1325)    ED Course  I have reviewed the triage vital signs and the nursing notes.  Pertinent labs & imaging results that were available during my care of the patient were reviewed by me and considered in my medical decision making (see chart for details).    MDM Rules/Calculators/A&P                         Consult: Patient seen as code stroke by Dr. Laurence Slate.  After initial evaluation and CT he suspects hypertensive symptoms.  Patient was given labetalol prior to my evaluation in CT.  Patient had slurred speech and some confusion.  This is resolving during her evaluation for stroke.  Patient's blood pressure has improved with labetalol 10 mg IV.  She is currently at goal of 160s over 80s.  Patient is awaiting MRI.  Lan for admission for TIA versus hypertensive encephalopathy. Final Clinical Impression(s)  / ED Diagnoses Final diagnoses:  Hypertensive encephalopathy    Rx / DC Orders ED Discharge Orders    None       Arby Barrette, MD 07/19/19 1506

## 2019-07-19 NOTE — Consult Note (Addendum)
Referring Physician: EMS    Reason for Consult: Code Stroke  HPI: Priscilla Tran is an 84 y.o. female with DM2, TIA, HLD, HTN, severe glaucoma with blindness, presents with slurred speech and hypertensive emergency210/114 noted by EMS. Pt was last normal at 10:30, then at 11:30 while speaking on phone with family, she suddenly became slurred and family called 911. Upon arrival, she has no focal deficits, she has some mild dysarthria. She noted some headache as well.  CTH neg for bleed. Her dysarthria also noted to be improving after reassessment, thus no tPA given. NIH1, now 0 and no focal deficits.   Date last known well: 07/19/19  Time last known well:10:30  tPA Given: no, low NIH and improving,   Past Medical History Past Medical History:  Diagnosis Date   Diabetes mellitus    Disc degeneration, lumbar    Dr. Regino Schultze   Glaucoma associated with ocular inflammation, severe stage    blind Right eye   Hyperlipidemia    Hypertension    Hypothyroid    SBO (small bowel obstruction) s/p lap LOA [id number] 03/31/2011    Surgical History Past Surgical History:  Procedure Laterality Date   ANTERIOR CERVICAL DECOMPRESSION/DISCECTOMY FUSION 4 LEVELS N/A 09/21/2017   Procedure: Cervical three-four, Cervical four-five, Cervical five-six, Cervical six-seven Anterior Cervical Decompression Fusion;  Surgeon: Julio Sicks, MD;  Location: [facility] OR;  Service: Neurosurgery;  Laterality: N/A;   EYE SURGERY  2008   glaucoma right eye. Per patient she has had multiple surgeries on right eye, resulting in scar tissue.   LAPAROSCOPY  04/04/2011   Procedure: LAPAROSCOPY DIAGNOSTIC;  Surgeon: Ardeth Sportsman, MD;  Location: [facility] ORS;  Service: General;  Laterality: N/A;   NM MYOCAR PERF WALL MOTION  06/20/2010   Normal   US ECHOCARDIOGRAPHY  05/06/2005   Mild MR, PI    Family History  Family History  Problem Relation Age of Onset   Heart disease Mother    Heart attack Mother    Heart disease Father     Heart attack Father    Cancer Brother        kidney    Social History:   reports that she has never smoked. She has never used smokeless tobacco. She reports current alcohol use. She reports that she does not use drugs.  Allergies:  Allergies  Allergen Reactions   Lisinopril Swelling    FACE THROAT " Breathing not affected per patient."    Home Medications:  (Not in a hospital admission)   Hospital Medications  sodium chloride flush  3 mL Intravenous Once    ROS:  History obtained from pt  General ROS: negative for - chills, fatigue, fever, night sweats, weight gain or weight loss Psychological ROS: negative for - behavioral disorder, hallucinations, memory difficulties, mood swings or suicidal ideation Ophthalmic ROS: negative for - blurry vision, double vision, eye pain or loss of vision ENT ROS: negative for - epistaxis, nasal discharge, oral lesions, sore throat, tinnitus or vertigo Allergy and Immunology ROS: negative for - hives or itchy/watery eyes Hematological and Lymphatic ROS: negative for - bleeding problems, bruising or swollen lymph nodes Endocrine ROS: negative for - galactorrhea, hair pattern changes, polydipsia/polyuria or temperature intolerance Respiratory ROS: negative for - cough, hemoptysis, shortness of breath or wheezing Cardiovascular ROS: negative for - chest pain, dyspnea on exertion, edema or irregular heartbeat Gastrointestinal ROS: negative for - abdominal pain, diarrhea, hematemesis, nausea/vomiting or stool incontinence Genito-Urinary ROS: negative for - dysuria, hematuria, incontinence  or urinary frequency/urgency Musculoskeletal ROS: negative for - joint swelling or muscular weakness Neurological ROS: as noted in HPI Dermatological ROS: negative for rash and skin lesion changes   Physical Examination:  There were no vitals filed for this visit.  General: Appears well-developed, mod distress Psych: Affect appropriate to  situation Eyes: No scleral injection HENT: No OP obstrucion Head: Normocephalic.  Cardiovascular: Normal rate and regular rhythm.  Respiratory: Effort normal and breath sounds normal to anterior ascultation GI: Soft.  No distension. There is no tenderness.  Skin: WDI    Neurological Examination Mental Status: Alert, oriented, thought content appropriate.  Speech fluent without evidence of aphasia. Initially there was some mild dysarthria, but this improved soon after arrival. Able to follow 3 step commands without difficulty. Cranial Nerves: II: Visual fields grossly normal,  III,IV, VI: ptosis not present, extra-ocular motions intact bilaterally, pupils equal, round, reactive to light and accommodation V,VII: smile symmetric, facial light touch sensation normal bilaterally VIII: hearing normal bilaterally IX,X: uvula rises symmetrically XI: bilateral shoulder shrug XII: midline tongue extension Motor: Right : Upper extremity   5/5    Left:     Upper extremity   5/5  Lower extremity   5/5     Lower extremity   5/5 Tone and bulk:normal tone throughout; no atrophy noted Sensory: Pinprick and light touch intact throughout, bilaterally Deep Tendon Reflexes: 2+ and symmetric throughout Plantars: Right: downgoing   Left: downgoing Cerebellar: normal finger-to-nose, normal rapid alternating movements and normal heel-to-shin test Gait: normal gait and station  NIHSS 1a Level of Conscious.:  1b LOC Questions:  1c LOC Commands:  2 Best Gaze:  3 Visual:  4 Facial Palsy:  5a Motor Arm - Left: 5b Motor Arm - Right:  6a Motor Leg - Left:  6b Motor Leg - Right:  7 Limb Ataxia:  8 Sensory:  9 Best Language:  10 Dysarthria: 1 11 Extinct. and Inatten.:  TOTAL: 1    LABORATORY STUDIES:  Basic Metabolic Panel: Recent Labs  Lab 07/19/19 1311  NA 137  K 4.2  CL 100  GLUCOSE 108*  BUN 12  CREATININE 0.70    Liver Function Tests: No results for input(s): AST, ALT, ALKPHOS,  BILITOT, PROT, ALBUMIN in the last 168 hours. No results for input(s): LIPASE, AMYLASE in the last 168 hours. No results for input(s): AMMONIA in the last 168 hours.  CBC: Recent Labs  Lab 07/19/19 1311  HGB 13.3  HCT 39.0    Cardiac Enzymes: No results for input(s): CKTOTAL, CKMB, CKMBINDEX, TROPONINI in the last 168 hours.  BNP: Invalid input(s): POCBNP  CBG: Recent Labs  Lab 07/19/19 1306  GLUCAP 94    Microbiology:   Coagulation Studies: No results for input(s): LABPROT, INR in the last 72 hours.  Urinalysis: No results for input(s): COLORURINE, LABSPEC, PHURINE, GLUCOSEU, HGBUR, BILIRUBINUR, KETONESUR, PROTEINUR, UROBILINOGEN, NITRITE, LEUKOCYTESUR in the last 168 hours.  Invalid input(s): APPERANCEUR  Lipid Panel:     Component Value Date/Time   CHOL 180 08/24/2017 0700   TRIG 101 08/24/2017 0700   HDL 63 08/24/2017 0700   CHOLHDL 2.9 08/24/2017 0700   VLDL 20 08/24/2017 0700   LDLCALC 97 08/24/2017 0700    HgbA1C:  Lab Results  Component Value Date   HGBA1C 6.3 (H) 08/24/2017    Urine Drug Screen:      Component Value Date/Time   LABOPIA NONE DETECTED 08/22/2017 1716   COCAINSCRNUR NONE DETECTED 08/22/2017 1716   LABBENZ NONE DETECTED 08/22/2017  1716   AMPHETMU NONE DETECTED 08/22/2017 1716   THCU NONE DETECTED 08/22/2017 1716   LABBARB NONE DETECTED 08/22/2017 1716     Alcohol Level:  No results for input(s): ETH in the last 168 hours.  Miscellaneous labs:  ECG - SR rate   BPM. (See cardiology reading for complete details)   IMAGING: No results found.  Assessment:  Priscilla Tran is a 84 y.o. female with history of  DM2, TIA, HLD, HTN, severe glaucoma with blindness, presents with slurred speech and hypertensive emergency 210/114 noted by EMS. Her dysarthria noted to be improving after reassessment in CT, thus no tPA given. NIH1, now 0 and no focal deficits. Ddx: PRES, TIA in setting of HTN emergency.    Plan:  HgbA1c,  fasting lipid panel  MRI brain without contrast to r/o PRES  Echocardiogram  Risk factor modification  Telemetry monitoring  Frequent neuro checks  Fall Precautions  DVT prophelaxis  NPO until swallowing evaluation has been passed (aspirin suppository if needed)      Attending Neurologist's note to follow  Desiree Metzger-Cihelka, ARNP-C, ANVP-BC Pager: [phone]   NEUROHOSPITALIST ADDENDUM Performed a face to face diagnostic evaluation.   I have reviewed the contents of history and physical exam as documented by PA/ARNP/Resident and agree with above documentation.  I have discussed and formulated the above plan as documented. Edits to the note have been made as needed.  Patient with acute onset slurred speech in the setting of accelerated hypertension of 210 systolic.-Suspect hypertensive emergency.  Recommend MRI brain to rule out posterior reversible encephalopathy syndrome, stroke. On examination patient has chronic blindness, but no other focal neurological deficits and slurred speech is resolved.  She is alert and oriented x3.  BP goal about 140 to 160 systolic.     Georgiana Spinner Shawnita Krizek MD [facility] [phone]   If 7pm to 7am, please call on call as listed on AMION.

## 2019-07-19 NOTE — ED Notes (Signed)
Pt is sinus brady to NSR w/BBB on monitor

## 2019-07-19 NOTE — Code Documentation (Signed)
84 yo female with hx of HTN, TIA, and Diabetes coming from home with complaints of sudden onset of confusion and slurred speech per family. Pt was called by daughter at 23 and reported to be at her baseline. Called back at 1130 and family noted patient was confused and slurring her words. EMS was called and activated a Code Stroke.   During transport, EMS noted CBG 121, BP 210/114 with patient anxious. She has hx of plavix, but no blood thinners.   Stroke Team met patient upon arrival to the ED. Initial NIHSS 3 due to baseline bilateral blindness. No other deficits noted upon arrival. Pt taken directly to CT. CT Head negative for hemorrhage. BP 182/76. MD Aroor reports potential hypertensive emergency. 10 mg of Labetalol given at 1325.   Returned to the ED and placed on cardiac monitor. Not a tPA candidate due to returned back to baseline. Plan is for TIA Alert - q2 hour mNIHSS/VS. MRI and SBP 160-180 per MD order. Handoff given to [facility], Therapist, sports.

## 2019-07-19 NOTE — ED Triage Notes (Signed)
Pt here from home via [facility] as a code stroke. Pt lives with daughter. Daughter left before work and pt was normal, they spoke on the phone at 1030 today, pt was normal. LKN 1030. Daughter called pt around 1130 and pt was confused and had some slurred speech. EMS activated code stroke, pt was hypertensive 210/100. Upon ED arrival, NIH was zero. Pt is blind in right eye, had surgery on L eye 6 weeks ago, reports cloudy vision. In CT pt hypertensive 182/76, pt given 10mg  labetalol at 1325. Pt aox4,  Vss.

## 2019-07-19 NOTE — ED Notes (Signed)
Pt given apple juice  

## 2019-07-19 NOTE — ED Notes (Signed)
Pt transported to MRI 

## 2019-07-19 NOTE — H&P (Signed)
History and Physical    Priscilla Tran:[id number] DOB: [birth year] DOA: 07/19/2019  PCP: Priscilla Au, MD (Confirm with patient/family/NH records and if not entered, this has to be entered at [facility] point of entry) Patient coming from: Home  I have personally briefly reviewed patient's old medical records in [facility]  Chief Complaint: I had trouble to understand people talking  HPI: Priscilla Tran is a 84 y.o. female with medical history significant of IIDM, HTN, HLD, TIA, Hypothyroid, glaucoma with legally blind on the right side, presented with trouble understanding and dysphagia this morning.  Patient lives by herself, and her daughter called her this morning around 69 and patient remembered that she could not understand what her daughter was talking about and the daughter reported that the patient had slurred speech and was very confused.  EMS arrived found the patient had significant elevation of blood pressure 210/114 and 1 dose of labetalol IV push given.  Patient also complaining about new onset lightheadedness when she woke up this morning but she said she has been compliant with all her medications and she took her BP meds in the morning.  She denies any chest pain no short of breath.  ED Course: CT head negative for acute finding, MRI pending, patient symptoms completely resolved and speech and understanding came back to her baseline.  Review of Systems: As per HPI otherwise 10 point review of systems negative.    Past Medical History:  Diagnosis Date  . Diabetes mellitus   . Disc degeneration, lumbar    Dr. Regino Tran  . Glaucoma associated with ocular inflammation, severe stage    blind Right eye  . Hyperlipidemia   . Hypertension   . Hypothyroid   . SBO (small bowel obstruction) s/p lap LOA [id number] 03/31/2011    Past Surgical History:  Procedure Laterality Date  . ANTERIOR CERVICAL DECOMPRESSION/DISCECTOMY FUSION 4 LEVELS N/A 09/21/2017   Procedure: Cervical  three-four, Cervical four-five, Cervical five-six, Cervical six-seven Anterior Cervical Decompression Fusion;  Surgeon: Julio Sicks, MD;  Location: [facility] OR;  Service: Neurosurgery;  Laterality: N/A;  . EYE SURGERY  2008   glaucoma right eye. Per patient she has had multiple surgeries on right eye, resulting in scar tissue.  Marland Kitchen LAPAROSCOPY  04/04/2011   Procedure: LAPAROSCOPY DIAGNOSTIC;  Surgeon: Ardeth Sportsman, MD;  Location: [facility] ORS;  Service: General;  Laterality: N/A;  . NM MYOCAR PERF WALL MOTION  06/20/2010   Normal  . US ECHOCARDIOGRAPHY  05/06/2005   Mild MR, PI     reports that she has never smoked. She has never used smokeless tobacco. She reports current alcohol use. She reports that she does not use drugs.  Allergies  Allergen Reactions  . Lisinopril Swelling    FACE THROAT " Breathing not affected per patient."    Family History  Problem Relation Age of Onset  . Heart disease Mother   . Heart attack Mother   . Heart disease Father   . Heart attack Father   . Cancer Brother        kidney      Prior to Admission medications   Medication Sig Start Date End Date Taking? Authorizing Provider  acetaminophen (TYLENOL) 325 MG tablet Take 2 tablets (650 mg total) by mouth every 4 (four) hours as needed for mild pain (or temp > 37.5 C (99.5 F)). 09/23/17  Yes Emokpae, Courage, MD  amLODipine (NORVASC) 10 MG tablet Take 1 tablet (10 mg total) by mouth  daily. 09/23/17  Yes Emokpae, Courage, MD  brimonidine (ALPHAGAN) 0.2 % ophthalmic solution Place 1 drop into the left eye in the morning and at bedtime. 07/12/19  Yes [provider]  carvedilol (COREG) 12.5 MG tablet Take 1 tablet (12.5 mg total) by mouth 2 (two) times daily with a meal. 09/23/17  Yes Emokpae, Courage, MD  clopidogrel (PLAVIX) 75 MG tablet TAKE 1 TABLET BY MOUTH DAILY (HOLD UNTIL 10/08/17) Patient taking differently: Take 75 mg by mouth daily.  12/21/18  Yes McCue, Shanda Bumps, NP  Cyanocobalamin (VITAMIN B-12 PO)  Take 1 tablet by mouth daily.   Yes [provider]  levothyroxine (SYNTHROID, LEVOTHROID) 150 MCG tablet Take 1 tablet (150 mcg total) by mouth daily. Patient taking differently: Take 100 mcg by mouth daily.  09/23/17  Yes Emokpae, Courage, MD  lovastatin (MEVACOR) 20 MG tablet Take 1 tablet (20 mg total) by mouth at bedtime. 10/16/17  Yes McCue, Shanda Bumps, NP  metFORMIN (GLUCOPHAGE) 1000 MG tablet Take 1 tablet (1,000 mg total) by mouth 2 (two) times daily with a meal. 09/23/17  Yes Emokpae, Courage, MD  prednisoLONE acetate (PRED FORTE) 1 % ophthalmic suspension Place 1 drop into the left eye 3 (three) times daily.    Yes [provider]  hydrochlorothiazide (HYDRODIURIL) 25 MG tablet Take 0.5 tablets (12.5 mg total) by mouth every morning. Patient not taking: Reported on 07/19/2019 09/23/17   Shon Hale, MD  HYDROcodone-acetaminophen (NORCO/VICODIN) 5-325 MG tablet Take 1 tablet by mouth every 4 (four) hours as needed for moderate pain or severe pain. Patient not taking: Reported on 07/19/2019 09/23/17   Shon Hale, MD  methocarbamol (ROBAXIN) 500 MG tablet Take 1 tablet (500 mg total) by mouth 3 (three) times daily. For Muscle spasm Patient not taking: Reported on 07/19/2019 09/23/17   Shon Hale, MD  ondansetron (ZOFRAN) 4 MG tablet Take 1 tablet (4 mg total) by mouth every 6 (six) hours as needed for nausea or vomiting. Patient not taking: Reported on 07/19/2019 09/23/17   Shon Hale, MD    Physical Exam: Vitals:   07/19/19 1700 07/19/19 1730 07/19/19 1800 07/19/19 1830  BP: (!) 170/83 (!) 180/76 (!) 155/66 133/81  Pulse: 77 (!) 55 62 81  Resp: 18 17 16 15   Temp:      TempSrc:      SpO2: 98% 97% 99% 100%  Weight:      Height:        Constitutional: NAD, calm, comfortable Vitals:   07/19/19 1700 07/19/19 1730 07/19/19 1800 07/19/19 1830  BP: (!) 170/83 (!) 180/76 (!) 155/66 133/81  Pulse: 77 (!) 55 62 81  Resp: 18 17 16 15   Temp:      TempSrc:       SpO2: 98% 97% 99% 100%  Weight:      Height:       Eyes: PERRL, lids and conjunctivae normal ENMT: Mucous membranes are moist. Posterior pharynx clear of any exudate or lesions.Normal dentition.  Neck: normal, supple, no masses, no thyromegaly Respiratory: clear to auscultation bilaterally, no wheezing, no crackles. Normal respiratory effort. No accessory muscle use.  Cardiovascular: Regular rate and rhythm, no murmurs / rubs / gallops. No extremity edema. 2+ pedal pulses. No carotid bruits.  Abdomen: no tenderness, no masses palpated. No hepatosplenomegaly. Bowel sounds positive.  Musculoskeletal: no clubbing / cyanosis. No joint deformity upper and lower extremities. Good ROM, no contractures. Normal muscle tone.  Skin: no rashes, lesions, ulcers. No induration Neurologic: CN 2-12 grossly intact.  Sensation intact, DTR normal. Strength 5/5 in all 4.  Psychiatric: Normal judgment and insight. Alert and oriented x 3. Normal mood.     Labs on Admission: I have personally reviewed following labs and imaging studies  CBC: Recent Labs  Lab 07/19/19 1311 07/19/19 1422  WBC  --  7.6  NEUTROABS  --  4.6  HGB 13.3 12.1  HCT 39.0 39.2  MCV  --  86.0  PLT  --  353   Basic Metabolic Panel: Recent Labs  Lab 07/19/19 1311 07/19/19 1422  NA 137 137  K 4.2 4.3  CL 100 102  CO2  --  23  GLUCOSE 108* 111*  BUN 12 11  CREATININE 0.70 0.74  CALCIUM  --  9.7   GFR: Estimated Creatinine Clearance: 48.7 mL/min (by C-G formula based on SCr of 0.74 mg/dL). Liver Function Tests: Recent Labs  Lab 07/19/19 1422  AST 21  ALT 11  ALKPHOS 65  BILITOT 1.0  PROT 6.9  ALBUMIN 4.2   No results for input(s): LIPASE, AMYLASE in the last 168 hours. No results for input(s): AMMONIA in the last 168 hours. Coagulation Profile: Recent Labs  Lab 07/19/19 1422  INR 1.0   Cardiac Enzymes: No results for input(s): CKTOTAL, CKMB, CKMBINDEX, TROPONINI in the last 168 hours. BNP (last 3  results) No results for input(s): PROBNP in the last 8760 hours. HbA1C: No results for input(s): HGBA1C in the last 72 hours. CBG: Recent Labs  Lab 07/19/19 1306 07/19/19 1648  GLUCAP 94 115*   Lipid Profile: No results for input(s): CHOL, HDL, LDLCALC, TRIG, CHOLHDL, LDLDIRECT in the last 72 hours. Thyroid Function Tests: No results for input(s): TSH, T4TOTAL, FREET4, T3FREE, THYROIDAB in the last 72 hours. Anemia Panel: No results for input(s): VITAMINB12, FOLATE, FERRITIN, TIBC, IRON, RETICCTPCT in the last 72 hours. Urine analysis:    Component Value Date/Time   COLORURINE STRAW (A) 07/19/2019 1430   APPEARANCEUR CLEAR 07/19/2019 1430   LABSPEC 1.004 (L) 07/19/2019 1430   PHURINE 7.0 07/19/2019 1430   GLUCOSEU NEGATIVE 07/19/2019 1430   HGBUR NEGATIVE 07/19/2019 1430   BILIRUBINUR NEGATIVE 07/19/2019 1430   BILIRUBINUR neg 03/30/2011 1924   KETONESUR NEGATIVE 07/19/2019 1430   PROTEINUR NEGATIVE 07/19/2019 1430   UROBILINOGEN 0.2 03/31/2011 0807   NITRITE NEGATIVE 07/19/2019 1430   LEUKOCYTESUR NEGATIVE 07/19/2019 1430    Radiological Exams on Admission: CT HEAD CODE STROKE WO CONTRAST  Result Date: 07/19/2019 CLINICAL DATA:  Code stroke.  Confusion and slurred speech EXAM: CT HEAD WITHOUT CONTRAST TECHNIQUE: Contiguous axial images were obtained from the base of the skull through the vertex without intravenous contrast. COMPARISON:  04/30/2019 FINDINGS: Brain: There is no acute intracranial hemorrhage, mass effect, or edema. Gray-white differentiation is preserved. There is no extra-axial fluid collection. Ventricles and sulci are within normal limits in size and configuration. Patchy hypoattenuation in the supratentorial white matter is nonspecific but probably reflects stable chronic microvascular ischemic changes Vascular: No hyperdense vessel or unexpected calcification. Skull: Calvarium is unremarkable. Sinuses/Orbits: No acute finding. Other: None. ASPECTS ([place]  Stroke Program Early CT Score) - Ganglionic level infarction (caudate, lentiform nuclei, internal capsule, insula, M1-M3 cortex): 7 - Supraganglionic infarction (M4-M6 cortex): 3 Total score (0-10 with 10 being normal): 10 IMPRESSION: No acute intracranial hemorrhage or evidence of acute infarction. ASPECTS is 10. Stable chronic microvascular ischemic changes. These results were communicated to Dr. Lorraine Lax at 1:25 pmon 6/22/2021by text page via the [facility] messaging system. Electronically Signed   By:  Guadlupe Spanish M.D.   On: 07/19/2019 13:27    EKG: Independently reviewed.  Poor R wave progression, no acute ST changes  Assessment/Plan Active Problems:   TIA (transient ischemic attack)  (please populate well all problems here in Problem List. (For example, if patient is on BP meds at home and you resume or decide to hold them, it is a problem that needs to be her. Same for CAD, COPD, HLD and so on)  TIA with acute dysphagia probably secondary to HTN emergency -BP already came down, will restart her home BP meds -Continue Plavix and statin -Check lipid panel and A1c, Echo, Carotid Dopp  Hypertension emergency -As above, resume home meds, add as needed hydralazine  IIDM -Sliding scale for now  Hypothyroidism -Continue Synthroid  DVT prophylaxis: Lovenox Code Status: fULL Code Family Communication: 2 daughters at bedside Disposition Plan: Likely can be discharged home within 24 hours after TIA study done Consults called: Neurology Admission status: Telemetry observation   Emeline General MD [facility] Pager [phone]    07/19/2019, 6:46 PM

## 2019-07-20 ENCOUNTER — Observation Stay (HOSPITAL_BASED_OUTPATIENT_CLINIC_OR_DEPARTMENT_OTHER): Payer: Medicare HMO

## 2019-07-20 DIAGNOSIS — I674 Hypertensive encephalopathy: Secondary | ICD-10-CM | POA: Diagnosis not present

## 2019-07-20 DIAGNOSIS — G459 Transient cerebral ischemic attack, unspecified: Secondary | ICD-10-CM | POA: Diagnosis not present

## 2019-07-20 DIAGNOSIS — I340 Nonrheumatic mitral (valve) insufficiency: Secondary | ICD-10-CM

## 2019-07-20 LAB — LIPID PANEL
Cholesterol: 254 mg/dL — ABNORMAL HIGH (ref 0–200)
HDL: 84 mg/dL (ref >40)
LDL Cholesterol: 145 mg/dL — ABNORMAL HIGH (ref 0–99)
Total CHOL/HDL Ratio: 3 RATIO
Triglycerides: 124 mg/dL (ref <150)
VLDL: 25 mg/dL (ref 0–40)

## 2019-07-20 LAB — HEMOGLOBIN A1C
Hgb A1c MFr Bld: 6.4 % — ABNORMAL HIGH (ref 4.8–5.6)
Mean Plasma Glucose: 136.98 mg/dL

## 2019-07-20 LAB — GLUCOSE, CAPILLARY
Glucose-Capillary: 115 mg/dL — ABNORMAL HIGH (ref 70–99)
Glucose-Capillary: 124 mg/dL — ABNORMAL HIGH (ref 70–99)

## 2019-07-20 LAB — ECHOCARDIOGRAM COMPLETE
Height: 64.5 in
Weight: 2345.69 oz

## 2019-07-20 LAB — SARS CORONAVIRUS 2 BY RT PCR (HOSPITAL ORDER, PERFORMED IN ~~LOC~~ HOSPITAL LAB): SARS Coronavirus 2: NEGATIVE

## 2019-07-20 MED ORDER — PRAVASTATIN SODIUM 40 MG PO TABS: 40.0000 mg | ORAL_TABLET | Freq: Every day | ORAL | Status: DC

## 2019-07-20 MED ORDER — ATORVASTATIN CALCIUM 40 MG PO TABS
40.0000 mg | ORAL_TABLET | Freq: Every day | ORAL | Status: DC
  Administered 2019-07-20: 40 mg via ORAL
  Filled 2019-07-20: qty 1

## 2019-07-20 MED ORDER — ATORVASTATIN CALCIUM 40 MG PO TABS: 40.0000 mg | ORAL_TABLET | Freq: Every day | ORAL | 0 refills | Status: AC

## 2019-07-20 MED ORDER — LOVASTATIN 40 MG PO TABS: 20.0000 mg | ORAL_TABLET | Freq: Every day | ORAL | 0 refills | Status: DC

## 2019-07-20 MED FILL — ATORVASTATIN CALCIUM 40 MG: 40 | 30 days supply | Qty: 30 | Fill #0

## 2019-07-20 NOTE — Progress Notes (Signed)
Nsg Discharge Note  Patient to be discharged home. Daughter present for teaching. Medication dropped off at room.  Admit Date:  07/19/2019 Discharge date: 07/20/2019   URSALA CRESSY to be D/C'd Home per MD order.  AVS completed.  Copy for chart, and copy for patient signed, and dated. Patient/caregiver able to verbalize understanding.  Discharge Medication: Allergies as of 07/20/2019      Reactions   Lisinopril Swelling   FACE THROAT " Breathing not affected per patient."      Medication List    STOP taking these medications   hydrochlorothiazide 25 MG tablet Commonly known as: HYDRODIURIL   HYDROcodone-acetaminophen 5-325 MG tablet Commonly known as: NORCO/VICODIN   lovastatin 20 MG tablet Commonly known as: MEVACOR   methocarbamol 500 MG tablet Commonly known as: Robaxin   ondansetron 4 MG tablet Commonly known as: ZOFRAN     TAKE these medications   acetaminophen 325 MG tablet Commonly known as: TYLENOL Take 2 tablets (650 mg total) by mouth every 4 (four) hours as needed for mild pain (or temp > 37.5 C (99.5 F)).   amLODipine 10 MG tablet Commonly known as: NORVASC Take 1 tablet (10 mg total) by mouth daily.   atorvastatin 40 MG tablet Commonly known as: LIPITOR Take 1 tablet (40 mg total) by mouth daily.   brimonidine 0.2 % ophthalmic solution Commonly known as: ALPHAGAN Place 1 drop into the left eye in the morning and at bedtime.   carvedilol 12.5 MG tablet Commonly known as: COREG Take 1 tablet (12.5 mg total) by mouth 2 (two) times daily with a meal.   clopidogrel 75 MG tablet Commonly known as: PLAVIX TAKE 1 TABLET BY MOUTH DAILY (HOLD UNTIL 10/08/17) What changed: See the new instructions.   levothyroxine 150 MCG tablet Commonly known as: SYNTHROID Take 1 tablet (150 mcg total) by mouth daily. What changed: how much to take   metFORMIN 1000 MG tablet Commonly known as: GLUCOPHAGE Take 1 tablet (1,000 mg total) by mouth 2 (two) times daily  with a meal.   prednisoLONE acetate 1 % ophthalmic suspension Commonly known as: PRED FORTE Place 1 drop into the left eye 3 (three) times daily.   VITAMIN B-12 PO Take 1 tablet by mouth daily.       Discharge Assessment: Vitals:   07/20/19 0851 07/20/19 1221  BP: (!) 153/80 128/68  Pulse: (!) 59 (!) 59  Resp:  16  Temp:  98.7 F (37.1 C)  SpO2:  99%   Skin clean, dry and intact without evidence of skin break down, no evidence of skin tears noted. IV catheter discontinued intact. Site without signs and symptoms of complications - no redness or edema noted at insertion site, patient denies c/o pain - only slight tenderness at site.  Dressing with slight pressure applied.  D/c Instructions-Education: Discharge instructions given to patient/family with verbalized understanding. D/c education completed with patient/family including follow up instructions, medication list, d/c activities limitations if indicated, with other d/c instructions as indicated by MD - patient able to verbalize understanding, all questions fully answered. Patient instructed to return to ED, call 911, or call MD for any changes in condition.  Patient escorted via WC, and D/C home via private auto.  Boykin Nearing, RN 07/20/2019 2:01 PM

## 2019-07-20 NOTE — Discharge Summary (Addendum)
Discharge Summary  Priscilla Tran UJW:[id number] DOB: 06/21/1935  PCP: Verlon Au, MD  Admit date: 07/19/2019 Discharge date: 07/20/2019  Time spent: 35 minutes  Recommendations for Outpatient Follow-up:  1. Follow-up with neurology 2. Follow-up with your primary care provider 3. Take your medications as prescribed.  Discharge Diagnoses:  Active Hospital Problems   Diagnosis Date Noted  . TIA (transient ischemic attack) 09/22/2017    Resolved Hospital Problems  No resolved problems to display.    Discharge Condition: Stable  Diet recommendation: Resume previous diet  Vitals:   07/20/19 0851 07/20/19 1221  BP: (!) 153/80 128/68  Pulse: (!) 59 (!) 59  Resp:  16  Temp:  98.7 F (37.1 C)  SpO2:  99%    History of present illness:  Priscilla Tran is a 84 y.o. female with medical history significant of IIDM, HTN, HLD, TIA, Hypothyroidism, glaucoma with legally blind on the right side, presented with trouble understanding and dysphagia this morning.  Patient lives by herself, and her daughter called her this morning around 37 and patient remembered that she could not understand what her daughter was talking about and the daughter reported that the patient had slurred speech and was very confused.  EMS arrived found the patient had significant elevation of blood pressure 210/114 and 1 dose of labetalol IV push given.  Patient also complaining about new onset lightheadedness when she woke up this morning but she said she has been compliant with all her medications and she took her BP meds in the morning.  She denies any chest pain no short of breath.  ED Course: CT head negative for acute finding, MRI no acute intracranial findings, patient symptoms completely resolved and speech and understanding came back to her baseline.  07/20/19: Seen and examined.  Appears to be back to her baseline mentation.  Speech is clear.  She has no new complaints.  Reports she ambulates with  a cane.  Will be assessed by PT.  Hospital Course:  Active Problems:   TIA (transient ischemic attack)  TIA Reports recurrence, first episode of TIA was in 08/23/2017. Presented with concern with inability to understand speech and slurred speech Her symptoms have resolved and she is back to her baseline. CT head and MRI negative for acute intracranial findings. Prior to admission was on Plavix and lovastatin. Hemoglobin A1c 6.4, at goal, goal less than 7.0. LDL 141, goal less than 70. Start high intensity dose Lipitor 40 mg daily Continue Plavix, defer further management to neurology/stroke team. 2D echo done on 07/20/2019 showed normal LVEF 60 to 65% grade 1 diastolic dysfunction, no evidence of thrombus or PFO. Twelve-lead EKG in sinus rhythm. Follow-up with neurology outpatient. Follow up with your PCP  Hyperlipidemia Ldl 141, goal less than 70 Start high intensity statin Lipitor 40 mg daily Follow up with your PCP  Hypertensive emergency, resolved Presented with SBP greater than 200 and TIA PRES ruled out on MRI. Antihypertensives have been resolved BP now stable, goal SBP between 140-160. Follow-up with your PCP  Hypothyroidism Continue levothyroxine  Type 2 diabetes, well controlled Hemoglobin A1c 6.4 on 07/20/2019 On Metformin 1000 mg twice daily at home. Resume home oral hypoglycemic regimen Follow-up with your PCP  Vitamin B12 deficiency Continue home dose vitamin B12 supplement  Ambulatory dysfunction Uses a cane at baseline PT assessed no further recommendations Continue fall precautions    Procedures:  2D echo  Consultations:  Neurology/stroke team  Discharge Exam: BP 128/68 (BP Location: Right  Arm)   Pulse (!) 59   Temp 98.7 F (37.1 C) (Oral)   Resp 16   Ht 5' 4.5" (1.638 m)   Wt 66.5 kg   SpO2 99%   BMI 24.78 kg/m  . General: 84 y.o. year-old female well developed well nourished in no acute distress.  Alert and oriented  x3. . Cardiovascular: Regular rate and rhythm with no rubs or gallops.  No thyromegaly or JVD noted.   Marland Kitchen Respiratory: Clear to auscultation with no wheezes or rales. Good inspiratory effort. . Abdomen: Soft nontender nondistended with normal bowel sounds x4 quadrants. . Musculoskeletal: No lower extremity edema. 2/4 pulses in all 4 extremities. Marland Kitchen Psychiatry: Mood is appropriate for condition and setting  Discharge Instructions You were cared for by a hospitalist during your hospital stay. If you have any questions about your discharge medications or the care you received while you were in the hospital after you are discharged, you can call the unit and asked to speak with the hospitalist on call if the hospitalist that took care of you is not available. Once you are discharged, your primary care physician will handle any further medical issues. Please note that NO REFILLS for any discharge medications will be authorized once you are discharged, as it is imperative that you return to your primary care physician (or establish a relationship with a primary care physician if you do not have one) for your aftercare needs so that they can reassess your need for medications and monitor your lab values.   Allergies as of 07/20/2019      Reactions   Lisinopril Swelling   FACE THROAT " Breathing not affected per patient."      Medication List    STOP taking these medications   hydrochlorothiazide 25 MG tablet Commonly known as: HYDRODIURIL   HYDROcodone-acetaminophen 5-325 MG tablet Commonly known as: NORCO/VICODIN   lovastatin 20 MG tablet Commonly known as: MEVACOR   methocarbamol 500 MG tablet Commonly known as: Robaxin   ondansetron 4 MG tablet Commonly known as: ZOFRAN     TAKE these medications   acetaminophen 325 MG tablet Commonly known as: TYLENOL Take 2 tablets (650 mg total) by mouth every 4 (four) hours as needed for mild pain (or temp > 37.5 C (99.5 F)).   amLODipine 10  MG tablet Commonly known as: NORVASC Take 1 tablet (10 mg total) by mouth daily.   atorvastatin 40 MG tablet Commonly known as: LIPITOR Take 1 tablet (40 mg total) by mouth daily.   brimonidine 0.2 % ophthalmic solution Commonly known as: ALPHAGAN Place 1 drop into the left eye in the morning and at bedtime.   carvedilol 12.5 MG tablet Commonly known as: COREG Take 1 tablet (12.5 mg total) by mouth 2 (two) times daily with a meal.   clopidogrel 75 MG tablet Commonly known as: PLAVIX TAKE 1 TABLET BY MOUTH DAILY (HOLD UNTIL 10/08/17) What changed: See the new instructions.   levothyroxine 150 MCG tablet Commonly known as: SYNTHROID Take 1 tablet (150 mcg total) by mouth daily. What changed: how much to take   metFORMIN 1000 MG tablet Commonly known as: GLUCOPHAGE Take 1 tablet (1,000 mg total) by mouth 2 (two) times daily with a meal.   prednisoLONE acetate 1 % ophthalmic suspension Commonly known as: PRED FORTE Place 1 drop into the left eye 3 (three) times daily.   VITAMIN B-12 PO Take 1 tablet by mouth daily.      Allergies  Allergen Reactions  .  Lisinopril Swelling    FACE THROAT " Breathing not affected per patient."    Follow-up Information    [facility]. Call in 1 day(s).   Why: Please call for a post hospital follow-up appointment. Contact information: [address]     [address] [place] [phone]               The results of significant diagnostics from this hospitalization (including imaging, microbiology, ancillary and laboratory) are listed below for reference.    Significant Diagnostic Studies: MR BRAIN WO CONTRAST  Result Date: 07/19/2019 CLINICAL DATA:  Hypertension. Rule out PRES. Patient with confusion. EXAM: MRI HEAD WITHOUT CONTRAST TECHNIQUE: Multiplanar, multiecho pulse sequences of the brain and surrounding structures were obtained without intravenous contrast. COMPARISON:   MRI head 09/15/2017.  CT head 07/19/2019 FINDINGS: Brain: Generalized atrophy. Patchy white matter hypodensity bilaterally. Mild patchy hyperintensity in the pons bilaterally Negative for acute infarct.  Negative for hemorrhage or mass. No cortical edema is identified.  Negative for PRES. Vascular: Normal arterial flow voids. Skull and upper cervical spine: No focal skeletal lesion. Normal bone marrow. ACDF cervical spine. Sinuses/Orbits: Mild mucosal edema paranasal sinuses. Bilateral ocular surgery. Scleral buckle on the right. Other: None IMPRESSION: No acute abnormality.  Negative for PRES. Atrophy and moderate chronic microvascular ischemic changes. Electronically Signed   By: Marlan Palauharles  Clark M.D.   On: 07/19/2019 16:29   ECHOCARDIOGRAM COMPLETE  Result Date: 07/20/2019    ECHOCARDIOGRAM REPORT   Patient Name:   Priscilla Tran Date of Exam: 07/20/2019 Medical Rec #:  [id number]      Height:       64.5 in Accession #:    [id number]     Weight:       146.6 lb Date of Birth:  [birth year]      BSA:          1.724 m Patient Age:    83 years       BP:           152/71 mmHg Patient Gender: F              HR:           52 bpm. Exam Location:  Inpatient Procedure: 2D Echo, Cardiac Doppler and Color Doppler Indications:    TIA 435.9 / G45.9  History:        Patient has prior history of Echocardiogram examinations, most                 recent 08/23/2017. Risk Factors:Hypertension, Diabetes and                 Dyslipidemia. Hypothyroidism.  Sonographer:    Elmarie Shileyiffany Dance Referring Phys: [id number] Emeline GeneralPING T ZHANG IMPRESSIONS  1. Left ventricular ejection fraction, by estimation, is 60 to 65%. The left ventricle has normal function. The left ventricle has no regional wall motion abnormalities. Left ventricular diastolic parameters are consistent with Grade I diastolic dysfunction (impaired relaxation).  2. Right ventricular systolic function is normal. The right ventricular size is normal.  3. The mitral valve is normal in  structure. Mild mitral valve regurgitation. No evidence of mitral stenosis.  4. The aortic valve is normal in structure. Aortic valve regurgitation is not visualized. No aortic stenosis is present. FINDINGS  Left Ventricle: Left ventricular ejection fraction, by estimation, is 60 to 65%. The left ventricle has normal function. The left ventricle has no regional wall motion abnormalities. The left ventricular  internal cavity size was normal in size. There is  no left ventricular hypertrophy. Left ventricular diastolic parameters are consistent with Grade I diastolic dysfunction (impaired relaxation). Right Ventricle: The right ventricular size is normal. No increase in right ventricular wall thickness. Right ventricular systolic function is normal. Left Atrium: Left atrial size was normal in size. Right Atrium: Right atrial size was normal in size. Pericardium: There is no evidence of pericardial effusion. Mitral Valve: The mitral valve is normal in structure. Mild mitral valve regurgitation. No evidence of mitral valve stenosis. Tricuspid Valve: The tricuspid valve is normal in structure. Tricuspid valve regurgitation is trivial. No evidence of tricuspid stenosis. Aortic Valve: The aortic valve is normal in structure. Aortic valve regurgitation is not visualized. No aortic stenosis is present. Pulmonic Valve: The pulmonic valve was normal in structure. Pulmonic valve regurgitation is not visualized. No evidence of pulmonic stenosis. Aorta: The aortic root and ascending aorta are structurally normal, with no evidence of dilitation. IAS/Shunts: The atrial septum is grossly normal.  LEFT VENTRICLE PLAX 2D LVIDd:         4.00 cm  Diastology LVIDs:         3.00 cm  LV e' lateral:   4.35 cm/s LV PW:         1.00 cm  LV E/e' lateral: 11.8 LV IVS:        1.00 cm  LV e' medial:    4.68 cm/s LVOT diam:     2.20 cm  LV E/e' medial:  11.0 LV SV:         67 LV SV Index:   39 LVOT Area:     3.80 cm  RIGHT VENTRICLE              IVC RV Basal diam:  2.90 cm     IVC diam: 1.40 cm RV S prime:     14.60 cm/s TAPSE (M-mode): 2.3 cm LEFT ATRIUM             Index       RIGHT ATRIUM           Index LA diam:        3.60 cm 2.09 cm/m  RA Area:     17.30 cm LA Vol (A2C):   48.7 ml 28.25 ml/m RA Volume:   42.10 ml  24.43 ml/m LA Vol (A4C):   53.8 ml 31.21 ml/m LA Biplane Vol: 53.8 ml 31.21 ml/m  AORTIC VALVE LVOT Vmax:   71.80 cm/s LVOT Vmean:  48.400 cm/s LVOT VTI:    0.175 m  AORTA Ao Root diam: 3.40 cm Ao Asc diam:  3.40 cm MITRAL VALVE MV Area (PHT): 2.11 cm    SHUNTS MV Decel Time: 359 msec    Systemic VTI:  0.18 m MV E velocity: 51.40 cm/s  Systemic Diam: 2.20 cm MV A velocity: 78.40 cm/s MV E/A ratio:  0.66 Kristeen Miss MD Electronically signed by Kristeen Miss MD Signature Date/Time: 07/20/2019/12:04:20 PM    Final    CT HEAD CODE STROKE WO CONTRAST  Result Date: 07/19/2019 CLINICAL DATA:  Code stroke.  Confusion and slurred speech EXAM: CT HEAD WITHOUT CONTRAST TECHNIQUE: Contiguous axial images were obtained from the base of the skull through the vertex without intravenous contrast. COMPARISON:  04/30/2019 FINDINGS: Brain: There is no acute intracranial hemorrhage, mass effect, or edema. Gray-white differentiation is preserved. There is no extra-axial fluid collection. Ventricles and sulci are within normal limits in size and configuration. Patchy hypoattenuation  in the supratentorial white matter is nonspecific but probably reflects stable chronic microvascular ischemic changes Vascular: No hyperdense vessel or unexpected calcification. Skull: Calvarium is unremarkable. Sinuses/Orbits: No acute finding. Other: None. ASPECTS ([place] Stroke Program Early CT Score) - Ganglionic level infarction (caudate, lentiform nuclei, internal capsule, insula, M1-M3 cortex): 7 - Supraganglionic infarction (M4-M6 cortex): 3 Total score (0-10 with 10 being normal): 10 IMPRESSION: No acute intracranial hemorrhage or evidence of acute infarction.  ASPECTS is 10. Stable chronic microvascular ischemic changes. These results were communicated to Dr. Lorraine Lax at 1:25 pmon 6/22/2021by text page via the [facility] messaging system. Electronically Signed   By: Macy Mis M.D.   On: 07/19/2019 13:27    Microbiology: Recent Results (from the past 240 hour(s))  SARS Coronavirus 2 by RT PCR (hospital order, performed in [facility] hospital lab) Nasopharyngeal Nasopharyngeal Swab     Status: None   Collection Time: 07/19/19 11:16 PM   Specimen: Nasopharyngeal Swab  Result Value Ref Range Status   SARS Coronavirus 2 NEGATIVE NEGATIVE Final    Comment: (NOTE) SARS-CoV-2 target nucleic acids are NOT DETECTED.  The SARS-CoV-2 RNA is generally detectable in upper and lower respiratory specimens during the acute phase of infection. The lowest concentration of SARS-CoV-2 viral copies this assay can detect is 250 copies / mL. A negative result does not preclude SARS-CoV-2 infection and should not be used as the sole basis for treatment or other patient management decisions.  A negative result may occur with improper specimen collection / handling, submission of specimen other than nasopharyngeal swab, presence of viral mutation(s) within the areas targeted by this assay, and inadequate number of viral copies (<250 copies / mL). A negative result must be combined with clinical observations, patient history, and epidemiological information.  Fact Sheet for Patients:   StrictlyIdeas.no  Fact Sheet for Healthcare Providers: BankingDealers.co.za  This test is not yet approved or  cleared by the [place] FDA and has been authorized for detection and/or diagnosis of SARS-CoV-2 by FDA under an Emergency Use Authorization (EUA).  This EUA will remain in effect (meaning this test can be used) for the duration of the COVID-19 declaration under Section 564(b)(1) of the Act, 21 U.S.C. section 360bbb-3(b)(1),  unless the authorization is terminated or revoked sooner.  Performed at [facility], [place] [address], [place], [place] [address]      Labs: Basic Metabolic Panel: Recent Labs  Lab 07/19/19 1311 07/19/19 1422  NA 137 137  K 4.2 4.3  CL 100 102  CO2  --  23  GLUCOSE 108* 111*  BUN 12 11  CREATININE 0.70 0.74  CALCIUM  --  9.7   Liver Function Tests: Recent Labs  Lab 07/19/19 1422  AST 21  ALT 11  ALKPHOS 65  BILITOT 1.0  PROT 6.9  ALBUMIN 4.2   No results for input(s): LIPASE, AMYLASE in the last 168 hours. No results for input(s): AMMONIA in the last 168 hours. CBC: Recent Labs  Lab 07/19/19 1311 07/19/19 1422  WBC  --  7.6  NEUTROABS  --  4.6  HGB 13.3 12.1  HCT 39.0 39.2  MCV  --  86.0  PLT  --  214   Cardiac Enzymes: No results for input(s): CKTOTAL, CKMB, CKMBINDEX, TROPONINI in the last 168 hours. BNP: BNP (last 3 results) No results for input(s): BNP in the last 8760 hours.  ProBNP (last 3 results) No results for input(s): PROBNP in the last 8760 hours.  CBG: Recent  Labs  Lab 07/19/19 1306 07/19/19 1648 07/20/19 0605 07/20/19 1113  GLUCAP 94 115* 115* 124*       Signed:  Darlin Drop, MD [facility] 07/20/2019, 12:36 PM

## 2019-07-20 NOTE — Evaluation (Signed)
Physical Therapy Evaluation Patient Details Name: Priscilla Tran MRN: [id number] DOB: 1935-08-14 Today's Date: 07/20/2019   History of Present Illness  Pt is an 84 yo female s/p slurred speech and confusion. CT head and MRI brain are negative for acute abnormality. PMHx;DM, HTN, HLD, TIA, falls.    Clinical Impression  Pt presented supine in bed with HOB elevated, awake and willing to participate in therapy session. Prior to admission, pt reported that she ambulated with use of a rollator (sometimes a cane) and was independent with ADLs. Pt lives with her daughter in a single level home with a level entry. At the time of evaluation, pt was able to perform bed mobility with supervision, transfers with min guard and ambulated a short distance in the room with use of RW and min guard for safety. Pt's daughter present throughout and reporting pt appears to be at her baseline in regards to mobility and cognition. Pt would continue to benefit from skilled physical therapy services at this time while admitted and after d/c to address the below listed limitations in order to improve overall safety and independence with functional mobility.     Follow Up Recommendations Home health PT;Supervision/Assistance - 24 hour    Equipment Recommendations  None recommended by PT    Recommendations for Other Services       Precautions / Restrictions Precautions Precautions: Fall Precaution Comments: x1 fall in the past 6 months Restrictions Weight Bearing Restrictions: No      Mobility  Bed Mobility Overal bed mobility: Needs Assistance Bed Mobility: Supine to Sit;Sit to Supine     Supine to sit: Supervision Sit to supine: Supervision   General bed mobility comments: increased time, no physical assistance needed  Transfers Overall transfer level: Needs assistance Equipment used: Rolling walker (2 wheeled) Transfers: Sit to/from Stand Sit to Stand: Min guard        General transfer  comment: cueing for hand placement and safety, use of momentum  Ambulation/Gait Ambulation/Gait assistance: Min guard Gait Distance (Feet): 30 Feet Assistive device: Rolling walker (2 wheeled) Gait Pattern/deviations: Step-to pattern;Step-through pattern;Decreased step length - right;Decreased step length - left;Decreased stride length Gait velocity: decreased   General Gait Details: pt with slow, steady gait with use of RW; able to navigate around room without difficulties  Stairs            Wheelchair Mobility    Modified Rankin (Stroke Patients Only)       Balance Overall balance assessment: Needs assistance Sitting-balance support: Feet supported Sitting balance-Leahy Scale: Good     Standing balance support: Single extremity supported;Bilateral upper extremity supported Standing balance-Leahy Scale: Poor                               Pertinent Vitals/Pain Pain Assessment: No/denies pain    Home Living Family/patient expects to be discharged to:: Private residence Living Arrangements: Children Available Help at Discharge: Family;Available 24 hours/day Type of Home: House Home Access: Level entry     Home Layout: One level Home Equipment: Walker - 2 wheels;Cane - single point;Shower seat;Grab bars - tub/shower;Bedside commode      Prior Function Level of Independence: Independent with assistive device(s)         Comments: Pt performs ADL and can cook; hires help to clean and daughter cooks big meals.     Hand Dominance   Dominant Hand: Right    Extremity/Trunk Assessment   Upper  Extremity Assessment Upper Extremity Assessment: Overall WFL for tasks assessed    Lower Extremity Assessment Lower Extremity Assessment: Overall WFL for tasks assessed    Cervical / Trunk Assessment Cervical / Trunk Assessment: Normal  Communication   Communication: No difficulties  Cognition Arousal/Alertness: Awake/alert Behavior During Therapy:  WFL for tasks assessed/performed Overall Cognitive Status: Within Functional Limits for tasks assessed                                        General Comments      Exercises     Assessment/Plan    PT Assessment Patient needs continued PT services  PT Problem List Decreased mobility;Decreased balance;Decreased coordination;Decreased knowledge of use of DME;Decreased safety awareness;Decreased knowledge of precautions       PT Treatment Interventions DME instruction;Gait training;Stair training;Functional mobility training;Therapeutic activities;Therapeutic exercise;Balance training;Neuromuscular re-education;Patient/family education    PT Goals (Current goals can be found in the Care Plan section)  Acute Rehab PT Goals Patient Stated Goal: to go home PT Goal Formulation: With patient/family Time For Goal Achievement: 08/03/19 Potential to Achieve Goals: Good    Frequency Min 3X/week   Barriers to discharge        Co-evaluation               AM-PAC PT "6 Clicks" Mobility  Outcome Measure Help needed turning from your back to your side while in a flat bed without using bedrails?: None Help needed moving from lying on your back to sitting on the side of a flat bed without using bedrails?: None Help needed moving to and from a bed to a chair (including a wheelchair)?: None Help needed standing up from a chair using your arms (e.g., wheelchair or bedside chair)?: A Little Help needed to walk in hospital room?: A Little Help needed climbing 3-5 steps with a railing? : A Lot 6 Click Score: 20    End of Session Equipment Utilized During Treatment: Gait belt Activity Tolerance: Patient tolerated treatment well Patient left: in bed;with call bell/phone within reach;with bed alarm set;with family/visitor present Nurse Communication: Mobility status PT Visit Diagnosis: Other abnormalities of gait and mobility (R26.89)    Time: 1041-1101 PT Time  Calculation (min) (ACUTE ONLY): 20 min   Charges:   PT Evaluation $PT Eval Moderate Complexity: 1 Mod          Ginette Pitman, PT, DPT  Acute Rehabilitation Services Pager [phone] Office [phone]    Alessandra Bevels Bailee Metter 07/20/2019, 12:35 PM

## 2019-07-20 NOTE — Progress Notes (Deleted)
NEUROLOGY PROGRESS NOTE  Subjective: Patient is alert and oriented, sitting up in bed. Her daughter is present at bedside. Per her daughter her mother is back at her baseline. Both the patient and her daughter are looking forward to getting discharged.  Exam: Vitals:   07/20/19 0851 07/20/19 1221  BP: (!) 153/80 128/68  Pulse: (!) 59 (!) 59  Resp:  16  Temp:  98.7 F (37.1 C)  SpO2:  99%   Neurological Exam Mental Status: Alert, oriented, thought content appropriate.  Speech fluent without evidence of aphasia.  Able to follow 3 step commands without difficulty. Cranial Nerves: II:  Blind in the right eye. Left visual field intact. III,IV, VI: Ptosis not present, extra-ocular motions intact bilaterally pupils equal, round, reactive to light and accommodation V,VII: smile symmetric, facial light touch sensation normal bilaterally VIII: Hearing intact to conversation  IX,X: Palate rises midline XI: Bilateral shoulder shrug XII: Midline tongue extension Motor: Movement intact to all extremities Tone and bulk:Normal tone throughout; no atrophy noted Sensory: Intact to light touch throughout  Cerebellar: FNF intact Gait: Deferred    Pertinent Labs/Diagnostics: 07/19/19 MRI Brain WO IV Contrast No acute abnormality.  Negative for PRES. Atrophy and moderate chronic microvascular ischemic changes.  07/19/19 CTH WO Contrast  There is no acute intracranial hemorrhage, mass effect, or edema. Gray-white differentiation is preserved. There is no extra-axial fluid collection. Ventricles and sulci are within normal limits in size and configuration. Patchy hypoattenuation in the supratentorial white matter is nonspecific but probably reflects stable chronic microvascular ischemic changes  07/20/19 Echocardiogram  Left ventricle ejection fraction by estimation is 60-65 %. LV with normal function, no wall abnormalities, normal in size. Right ventricle size is normal. Right ventricle systolic  function is normal. LA normal in size. Right atrial size normal.   Stroke Labs:  Hemoglobin A1C: 6.4  Lipid panel: Cholesterol 254, LDL 145  Assessment:  Priscilla Tran is a 84 year old female w/pmh of DMII, HTN, HLD, Hypothyroidism, glaucoma, right eye blindness who presents with confusion and slurred speech more than likely in the setting of hypertensive urgency with systolic blood pressure in the 200s. Her blood pressure was lowered by her primary team with concurrent resolution of her symptoms. Other differentials considered upon presentation were PRES,TIA however hypertensive urgency is the most compelling diagnosis given that symptoms resolved with lowering of her blood pressure. MRI Brain WO Contrast was negative for PRES as well as acute stroke, however it did reveal chronic microvascular ischemic changes.  Recommendations: - Continue Plavix and Lipitor 40 mg HS  - Continue blood pressure management targeting normotensive range - No further neurological work up or recommendations - Neurology will sign off   Stark Jock, NP [facility] Patient seen and discussed with attending physician Dr. Laurence Slate  07/20/2019, 1:38 PM

## 2019-07-20 NOTE — Progress Notes (Signed)
SLP Cancellation Note  Patient Details Name: Priscilla Tran MRN: [id number] DOB: 1935/05/31   Cancelled treatment:  MRI negative for acute neuro event; per chart screen, pt is at baseline with good safety awareness.  No SLP evaluation needed. Our service will sign off.          Blenda Mounts Laurice 07/20/2019, 2:01 PM

## 2019-07-20 NOTE — Evaluation (Addendum)
Occupational Therapy Evaluation Patient Details Name: Priscilla Tran MRN: [id number] DOB: 05-Jan-1936 Today's Date: 07/20/2019    History of Present Illness Pt is an 84 yo female s/p slurred speech and confusion. CT head and MRI brain are negative for acute abnormality. PMHx;DM, HTN, HLD, TIA, falls.   Clinical Impression   Pt PTA: Pt and daughter live together although pt reports that pt is independent with ADL and mobility; daughter provides IADLs and transportation. Pt currently performing ADL and mobility with RW for stability with supervisionA to modified independence. Pt with R eye legally blindness and pt in unfamiliar environment, but continued to have good safety awareness.  No focal deficits notified- speech is clear and pt A/O x4. Pt standing at sink for ADL with no LOB episodes. No further OT acute care services required. OT signing off.      Follow Up Recommendations  No OT follow up;Supervision - Intermittent    Equipment Recommendations  None recommended by OT    Recommendations for Other Services       Precautions / Restrictions Precautions Precautions: Fall Precaution Comments: x1 fall in the past 6 months Restrictions Weight Bearing Restrictions: No      Mobility Bed Mobility Overal bed mobility: Needs Assistance Bed Mobility: Supine to Sit     Supine to sit: Supervision        Transfers Overall transfer level: Needs assistance Equipment used: Rolling walker (2 wheeled) Transfers: Sit to/from [facility] Sit to Stand: Supervision Stand pivot transfers: Supervision       General transfer comment: supervisionA for safety    Balance Overall balance assessment: Needs assistance Sitting-balance support: Single extremity supported;Feet supported Sitting balance-Leahy Scale: Good     Standing balance support: Single extremity supported Standing balance-Leahy Scale: Fair                             ADL either  performed or assessed with clinical judgement   ADL Overall ADL's : At baseline                                       General ADL Comments: Pt supervisionA to modified independent for ADL and mobility in room. Pt requiring supervisionA due to low vision in R eye and pt in new  environment.     Vision Baseline Vision/History: Wears glasses (R eye legally blind) Patient Visual Report: No change from baseline Vision Assessment?: Yes Eye Alignment: Within Functional Limits Ocular Range of Motion: Within Functional Limits;Restricted on the right Alignment/Gaze Preference: Within Defined Limits Tracking/Visual Pursuits: Able to track stimulus in all quads without difficulty;Impaired - to be further tested in functional context (R eye legally blind)     Perception     Praxis      Pertinent Vitals/Pain Pain Assessment: No/denies pain     Hand Dominance Right   Extremity/Trunk Assessment Upper Extremity Assessment Upper Extremity Assessment: Overall WFL for tasks assessed   Lower Extremity Assessment Lower Extremity Assessment: Overall WFL for tasks assessed   Cervical / Trunk Assessment Cervical / Trunk Assessment: Normal   Communication Communication Communication: No difficulties   Cognition Arousal/Alertness: Awake/alert Behavior During Therapy: WFL for tasks assessed/performed Overall Cognitive Status: Within Functional Limits for tasks assessed  General Comments       Exercises     Shoulder Instructions      Home Living Family/patient expects to be discharged to:: Private residence Living Arrangements: Children Available Help at Discharge: Family;Available PRN/intermittently Type of Home: House Home Access: Level entry     Home Layout: One level     Bathroom Shower/Tub: Chief Strategy Officer: Handicapped height     Home Equipment: Environmental consultant - 2 wheels;Cane - single  point;Shower seat;Grab bars - tub/shower;Bedside commode          Prior Functioning/Environment Level of Independence: Independent with assistive device(s)        Comments: Pt performs ADL and can cook; hires help to clean and daughter cooks big meals.        OT Problem List: Decreased activity tolerance      OT Treatment/Interventions:      OT Goals(Current goals can be found in the care plan section) Acute Rehab OT Goals Patient Stated Goal: to go home OT Goal Formulation: With patient  OT Frequency:     Barriers to D/C:            Co-evaluation              AM-PAC OT "6 Clicks" Daily Activity     Outcome Measure Help from another person eating meals?: None Help from another person taking care of personal grooming?: A Little Help from another person toileting, which includes using toliet, bedpan, or urinal?: A Little Help from another person bathing (including washing, rinsing, drying)?: A Little Help from another person to put on and taking off regular upper body clothing?: None Help from another person to put on and taking off regular lower body clothing?: A Little 6 Click Score: 20   End of Session Equipment Utilized During Treatment: Gait belt;Rolling walker Nurse Communication: Mobility status  Activity Tolerance: Patient tolerated treatment well Patient left: in bed;with call bell/phone within reach  OT Visit Diagnosis: Unsteadiness on feet (R26.81)                Time: 3149-7026 OT Time Calculation (min): 38 min Charges:  OT General Charges $OT Visit: 1 Visit OT Evaluation $OT Eval Moderate Complexity: 1 Mod OT Treatments $Self Care/Home Management : 23-37 mins  Flora Lipps, OTR/L Acute Rehabilitation Services Pager: [phone] Office: [phone]   Kohan Azizi C 07/20/2019, 12:09 PM

## 2019-07-20 NOTE — Discharge Instructions (Signed)
Transient Ischemic Attack  A transient ischemic attack (TIA) is a "warning stroke" that causes stroke-like symptoms that go away quickly. A TIA does not cause lasting damage to the brain. But having a TIA is a sign that you may be at risk for a stroke. Lifestyle changes and medical treatments can help prevent a stroke. It is important to know the symptoms of a TIA and what to do. Get help right away, even if your symptoms go away. The symptoms of a TIA are the same as those of a stroke. They can happen fast, and they usually go away within minutes or hours. They can include:  Weakness or loss of feeling in your face, arm, or leg. This often happens on one side of your body.  Trouble walking.  Trouble moving your arms or legs.  Trouble talking or understanding what people are saying.  Trouble seeing.  Seeing two of one object (double vision).  Feeling dizzy.  Feeling confused.  Loss of balance or coordination.  Feeling sick to your stomach (nauseous) and throwing up (vomiting).  A very bad headache for no reason. What increases the risk? Certain things may make you more likely to have a TIA. Some of these are things that you can change, such as:  Being very overweight (obese).  Using products that contain nicotine or tobacco, such as cigarettes and e-cigarettes.  Taking birth control pills.  Not being active.  Drinking too much alcohol.  Using drugs. Other risk factors include:  Having an irregular heartbeat (atrial fibrillation).  Being African American or Hispanic.  Having had blood clots, stroke, TIA, or heart attack in the past.  Being a woman with a history of high blood pressure in pregnancy (preeclampsia).  Being over the age of 60.  Being female.  Having family history of stroke.  Having the following diseases or conditions: ? High blood pressure. ? High cholesterol. ? Diabetes. ? Heart disease. ? Sickle cell disease. ? Sleep apnea. ? Migraine  headache. ? Long-term (chronic) diseases that cause soreness and swelling (inflammation). ? Disorders that affect how your blood clots. Follow these instructions at home: Medicines   Take over-the-counter and prescription medicines only as told by your doctor.  If you were told to take aspirin or another medicine to thin your blood, take it exactly as told by your doctor. ? Taking too much of the medicine can cause bleeding. ? Taking too little of the medicine may not work to treat the problem. Eating and drinking   Eat 5 or more servings of fruits and vegetables each day.  Follow instructions from your doctor about your diet. You may need to follow a certain diet to help lower your risk of having a stroke. You may need to: ? Eat a diet that is low in fat and salt. ? Eat foods that contain a lot of fiber. ? Limit the amount of carbohydrates and sugar in your diet.  Limit alcohol intake to 1 drink a day for nonpregnant women and 2 drinks a day for men. One drink equals 12 oz of beer, 5 oz of wine, or 1 oz of hard liquor. General instructions  Keep a healthy weight.  Stay active. Try to get at least 30 minutes of activity on all or most days.  Find out if you have a condition called sleep apnea. Get treatment if needed.  Do not use any products that contain nicotine or tobacco, such as cigarettes and e-cigarettes. If you need help quitting,   ask your doctor.  Do not abuse drugs.  Keep all follow-up visits as told by your doctor. This is important. Get help right away if:  You have any signs of stroke. "BE FAST" is an easy way to remember the main warning signs: ? B - Balance. Signs are dizziness, sudden trouble walking, or loss of balance. ? E - Eyes. Signs are trouble seeing or a sudden change in how you see. ? F - Face. Signs are sudden weakness or loss of feeling of the face, or the face or eyelid drooping on one side. ? A - Arms. Signs are weakness or loss of feeling in an  arm. This happens suddenly and usually on one side of the body. ? S - Speech. Signs are sudden trouble speaking, slurred speech, or trouble understanding what people say. ? T - Time. Time to call emergency services. Write down what time symptoms started.  You have other signs of stroke, such as: ? A sudden, very bad headache with no known cause. ? Feeling sick to your stomach (nausea). ? Throwing up (vomiting). ? Jerky movements that you cannot control (seizure). These symptoms may be an emergency. Do not wait to see if the symptoms will go away. Get medical help right away. Call your local emergency services (911 in the [place] Do not drive yourself to the hospital. Summary  A transient ischemic attack (TIA) is a "warning stroke" that causes stroke-like symptoms that go away quickly.  A TIA is a medical emergency. Get help right away, even if your symptoms go away.  A TIA does not cause lasting damage to the brain.  Having a TIA is a sign that you may be at risk for a stroke. Lifestyle changes and medical treatments can help prevent a stroke. This information is not intended to replace advice given to you by your health care provider. Make sure you discuss any questions you have with your health care provider. Document Revised: 10/09/2017 Document Reviewed: 04/16/2016 Elsevier Patient Education  2020 [employer]  

## 2019-07-20 NOTE — Progress Notes (Signed)
  Echocardiogram 2D Echocardiogram has been performed.  Priscilla Tran 07/20/2019, 9:50 AM

## 2019-07-20 NOTE — Progress Notes (Addendum)
NEUROLOGY PROGRESS NOTE  Subjective: Patient is alert and oriented, sitting up in bed. Her daughter is present at bedside. Per her daughter her mother is back at her baseline. Both the patient and her daughter are looking forward to getting discharged.  Exam:     Vitals:   07/20/19 0851 07/20/19 1221  BP: (!) 153/80 128/68  Pulse: (!) 59 (!) 59  Resp:  16  Temp:  98.7 F (37.1 C)  SpO2:  99%   Neurological Exam Mental Status: Alert, oriented, thought content appropriate.  Speech fluent without evidence of aphasia.  Able to follow 3 step commands without difficulty. Cranial Nerves: II:  Blind in the right eye. Left visual field intact. III,IV, VI: Ptosis not present, extra-ocular motions intact bilaterally pupils equal, round, reactive to light and accommodation V,VII: smile symmetric, facial light touch sensation normal bilaterally VIII: Hearing intact to conversation  IX,X: Palate rises midline XI: Bilateral shoulder shrug XII: Midline tongue extension Motor: Movement intact to all extremities Tone and bulk:Normal tone throughout; no atrophy noted Sensory: Intact to light touch throughout  Cerebellar: FNF intact Gait: Deferred    Pertinent Labs/Diagnostics: 07/19/19 MRI Brain WO IV Contrast No acute abnormality. Negative for PRES. Atrophy and moderate chronic microvascular ischemic changes.  07/19/19 CTH WO Contrast  There is no acute intracranial hemorrhage, mass effect, or edema. Gray-white differentiation is preserved. There is no extra-axial fluid collection. Ventricles and sulci are within normal limits in size and configuration. Patchy hypoattenuation in the supratentorial white matter is nonspecific but probably reflects stable chronic microvascular ischemic changes  07/20/19 Echocardiogram  Left ventricle ejection fraction by estimation is 60-65 %. LV with normal function, no wall abnormalities, normal in size. Right ventricle size is normal. Right ventricle  systolic function is normal. LA normal in size. Right atrial size normal.   Stroke Labs:  Hemoglobin A1C: 6.4  Lipid panel: Cholesterol 254, LDL 145  Assessment:  Priscilla Tran is a 84 year old female w/pmh of DMII, HTN, HLD, Hypothyroidism, glaucoma, right eye blindness who presents with confusion and slurred speech more than likely in the setting of hypertensive urgency/encephalopathy with systolic blood pressure in the 200s. Her blood pressure was lowered by her primary team with concurrent resolution of her symptoms. Other differentials considered upon presentation were PRES,TIA however hypertensive urgency is the most compelling diagnosis given that symptoms resolved with lowering of her blood pressure. MRI Brain WO Contrast was negative for PRES as well as acute stroke, however it did reveal chronic microvascular ischemic changes.  Recommendations: - Continue Plavix and Lipitor 40 mg HS  - Continue blood pressure management targeting normotensive range - No further neurological work up or recommendations - Neurology will sign off  Felicie Morn PA-C [facility] [phone]  M-F  (9:00 am- 5:00 PM)  07/20/2019, 4:50 PM

## 2019-07-21 ENCOUNTER — Inpatient Hospital Stay (HOSPITAL_COMMUNITY): Payer: Medicare HMO

## 2019-07-21 ENCOUNTER — Inpatient Hospital Stay (HOSPITAL_COMMUNITY)
Admission: EM | Admit: 2019-07-21 | Discharge: 2019-07-23 | DRG: 069 | Disposition: A | Discharge status: Home / Self Care | Payer: Medicare HMO | Attending: Internal Medicine | Admitting: Internal Medicine

## 2019-07-21 ENCOUNTER — Other Ambulatory Visit: Payer: Self-pay

## 2019-07-21 ENCOUNTER — Emergency Department (HOSPITAL_COMMUNITY): Payer: Medicare HMO

## 2019-07-21 DIAGNOSIS — Z7902 Long term (current) use of antithrombotics/antiplatelets: Secondary | ICD-10-CM | POA: Diagnosis not present

## 2019-07-21 DIAGNOSIS — R269 Unspecified abnormalities of gait and mobility: Secondary | ICD-10-CM | POA: Diagnosis present

## 2019-07-21 DIAGNOSIS — Z8673 Personal history of transient ischemic attack (TIA), and cerebral infarction without residual deficits: Secondary | ICD-10-CM

## 2019-07-21 DIAGNOSIS — H548 Legal blindness, as defined in USA: Secondary | ICD-10-CM | POA: Diagnosis present

## 2019-07-21 DIAGNOSIS — E871 Hypo-osmolality and hyponatremia: Secondary | ICD-10-CM | POA: Diagnosis present

## 2019-07-21 DIAGNOSIS — E785 Hyperlipidemia, unspecified: Secondary | ICD-10-CM | POA: Diagnosis present

## 2019-07-21 DIAGNOSIS — H269 Unspecified cataract: Secondary | ICD-10-CM | POA: Diagnosis present

## 2019-07-21 DIAGNOSIS — Z981 Arthrodesis status: Secondary | ICD-10-CM

## 2019-07-21 DIAGNOSIS — E7800 Pure hypercholesterolemia, unspecified: Secondary | ICD-10-CM | POA: Diagnosis not present

## 2019-07-21 DIAGNOSIS — R1310 Dysphagia, unspecified: Secondary | ICD-10-CM | POA: Diagnosis present

## 2019-07-21 DIAGNOSIS — E538 Deficiency of other specified B group vitamins: Secondary | ICD-10-CM | POA: Diagnosis present

## 2019-07-21 DIAGNOSIS — R471 Dysarthria and anarthria: Secondary | ICD-10-CM | POA: Diagnosis present

## 2019-07-21 DIAGNOSIS — Z79899 Other long term (current) drug therapy: Secondary | ICD-10-CM

## 2019-07-21 DIAGNOSIS — Z8616 Personal history of COVID-19: Secondary | ICD-10-CM

## 2019-07-21 DIAGNOSIS — E039 Hypothyroidism, unspecified: Secondary | ICD-10-CM | POA: Diagnosis present

## 2019-07-21 DIAGNOSIS — Z8051 Family history of malignant neoplasm of kidney: Secondary | ICD-10-CM

## 2019-07-21 DIAGNOSIS — R441 Visual hallucinations: Secondary | ICD-10-CM | POA: Diagnosis present

## 2019-07-21 DIAGNOSIS — G459 Transient cerebral ischemic attack, unspecified: Principal | ICD-10-CM | POA: Diagnosis present

## 2019-07-21 DIAGNOSIS — I674 Hypertensive encephalopathy: Secondary | ICD-10-CM | POA: Diagnosis present

## 2019-07-21 DIAGNOSIS — Z794 Long term (current) use of insulin: Secondary | ICD-10-CM | POA: Diagnosis not present

## 2019-07-21 DIAGNOSIS — Z8249 Family history of ischemic heart disease and other diseases of the circulatory system: Secondary | ICD-10-CM

## 2019-07-21 DIAGNOSIS — G9341 Metabolic encephalopathy: Secondary | ICD-10-CM | POA: Diagnosis not present

## 2019-07-21 DIAGNOSIS — D649 Anemia, unspecified: Secondary | ICD-10-CM | POA: Diagnosis present

## 2019-07-21 DIAGNOSIS — R410 Disorientation, unspecified: Secondary | ICD-10-CM | POA: Diagnosis not present

## 2019-07-21 DIAGNOSIS — H409 Unspecified glaucoma: Secondary | ICD-10-CM | POA: Diagnosis present

## 2019-07-21 DIAGNOSIS — R4182 Altered mental status, unspecified: Secondary | ICD-10-CM | POA: Diagnosis present

## 2019-07-21 DIAGNOSIS — Z888 Allergy status to other drugs, medicaments and biological substances status: Secondary | ICD-10-CM

## 2019-07-21 DIAGNOSIS — E119 Type 2 diabetes mellitus without complications: Secondary | ICD-10-CM | POA: Diagnosis present

## 2019-07-21 DIAGNOSIS — R001 Bradycardia, unspecified: Secondary | ICD-10-CM | POA: Diagnosis present

## 2019-07-21 LAB — CBC WITH DIFFERENTIAL/PLATELET
Abs Immature Granulocytes: 0.04 10*3/uL (ref 0.00–0.07)
Basophils Absolute: 0.1 10*3/uL (ref 0.0–0.1)
Basophils Relative: 1 %
Eosinophils Absolute: 0.2 10*3/uL (ref 0.0–0.5)
Eosinophils Relative: 2 %
HCT: 38.9 % (ref 36.0–46.0)
Hemoglobin: 12.5 g/dL (ref 12.0–15.0)
Immature Granulocytes: 0 %
Lymphocytes Relative: 20 %
Lymphs Abs: 1.9 10*3/uL (ref 0.7–4.0)
MCH: 26.7 pg (ref 26.0–34.0)
MCHC: 32.1 g/dL (ref 30.0–36.0)
MCV: 82.9 fL (ref 80.0–100.0)
Monocytes Absolute: 0.8 10*3/uL (ref 0.1–1.0)
Monocytes Relative: 8 %
Neutro Abs: 7 10*3/uL (ref 1.7–7.7)
Neutrophils Relative %: 69 %
Platelets: 234 10*3/uL (ref 150–400)
RBC: 4.69 MIL/uL (ref 3.87–5.11)
RDW: 15.6 % — ABNORMAL HIGH (ref 11.5–15.5)
WBC: 10 10*3/uL (ref 4.0–10.5)
nRBC: 0 % (ref 0.0–0.2)

## 2019-07-21 LAB — URINALYSIS, ROUTINE W REFLEX MICROSCOPIC
Bacteria, UA: NONE SEEN
Bilirubin Urine: NEGATIVE
Glucose, UA: NEGATIVE mg/dL
Hgb urine dipstick: NEGATIVE
Ketones, ur: NEGATIVE mg/dL
Leukocytes,Ua: NEGATIVE
Nitrite: NEGATIVE
Protein, ur: NEGATIVE mg/dL
Specific Gravity, Urine: 1.003 — ABNORMAL LOW (ref 1.005–1.030)
pH: 6 (ref 5.0–8.0)

## 2019-07-21 LAB — COMPREHENSIVE METABOLIC PANEL
ALT: 14 U/L (ref 0–44)
AST: 23 U/L (ref 15–41)
Albumin: 4.3 g/dL (ref 3.5–5.0)
Alkaline Phosphatase: 62 U/L (ref 38–126)
Anion gap: 13 (ref 5–15)
BUN: 13 mg/dL (ref 8–23)
CO2: 22 mmol/L (ref 22–32)
Calcium: 9.5 mg/dL (ref 8.9–10.3)
Chloride: 98 mmol/L (ref 98–111)
Creatinine, Ser: 0.72 mg/dL (ref 0.44–1.00)
GFR calc Af Amer: >60 mL/min (ref >60)
GFR calc non Af Amer: >60 mL/min (ref >60)
Glucose, Bld: 132 mg/dL — ABNORMAL HIGH (ref 70–99)
Potassium: 4.3 mmol/L (ref 3.5–5.1)
Sodium: 133 mmol/L — ABNORMAL LOW (ref 135–145)
Total Bilirubin: 1 mg/dL (ref 0.3–1.2)
Total Protein: 7.3 g/dL (ref 6.5–8.1)

## 2019-07-21 LAB — CBC
HCT: 36.6 % (ref 36.0–46.0)
Hemoglobin: 11.8 g/dL — ABNORMAL LOW (ref 12.0–15.0)
MCH: 26.9 pg (ref 26.0–34.0)
MCHC: 32.2 g/dL (ref 30.0–36.0)
MCV: 83.4 fL (ref 80.0–100.0)
Platelets: 230 10*3/uL (ref 150–400)
RBC: 4.39 MIL/uL (ref 3.87–5.11)
RDW: 15.3 % (ref 11.5–15.5)
WBC: 8.3 10*3/uL (ref 4.0–10.5)
nRBC: 0 % (ref 0.0–0.2)

## 2019-07-21 LAB — LIPID PANEL
Cholesterol: 217 mg/dL — ABNORMAL HIGH (ref 0–200)
HDL: 78 mg/dL (ref >40)
LDL Cholesterol: 125 mg/dL — ABNORMAL HIGH (ref 0–99)
Total CHOL/HDL Ratio: 2.8 RATIO
Triglycerides: 71 mg/dL (ref <150)
VLDL: 14 mg/dL (ref 0–40)

## 2019-07-21 LAB — CREATININE, SERUM
Creatinine, Ser: 0.69 mg/dL (ref 0.44–1.00)
GFR calc Af Amer: >60 mL/min (ref >60)
GFR calc non Af Amer: >60 mL/min (ref >60)

## 2019-07-21 LAB — TSH: TSH: 3.671 u[IU]/mL (ref 0.350–4.500)

## 2019-07-21 LAB — FOLATE: Folate: 20.8 ng/mL (ref >5.9)

## 2019-07-21 LAB — CBG MONITORING, ED
Glucose-Capillary: 113 mg/dL — ABNORMAL HIGH (ref 70–99)
Glucose-Capillary: 129 mg/dL — ABNORMAL HIGH (ref 70–99)

## 2019-07-21 LAB — PROTIME-INR
INR: 0.9 (ref 0.8–1.2)
Prothrombin Time: 12.1 seconds (ref 11.4–15.2)

## 2019-07-21 LAB — TROPONIN I (HIGH SENSITIVITY)
Troponin I (High Sensitivity): <2 ng/L (ref <18)
Troponin I (High Sensitivity): <2 ng/L (ref <18)

## 2019-07-21 LAB — VITAMIN B12: Vitamin B-12: 647 pg/mL (ref 180–914)

## 2019-07-21 LAB — AMMONIA: Ammonia: 19 umol/L (ref 9–35)

## 2019-07-21 LAB — ETHANOL: Alcohol, Ethyl (B): <10 mg/dL (ref <10)

## 2019-07-21 LAB — SARS CORONAVIRUS 2 BY RT PCR (HOSPITAL ORDER, PERFORMED IN ~~LOC~~ HOSPITAL LAB): SARS Coronavirus 2: NEGATIVE

## 2019-07-21 MED ORDER — ENOXAPARIN SODIUM 40 MG/0.4ML ~~LOC~~ SOLN
40.0000 mg | SUBCUTANEOUS | Status: DC
  Administered 2019-07-22: 40 mg via SUBCUTANEOUS
  Filled 2019-07-21: qty 0.4

## 2019-07-21 MED ORDER — INSULIN ASPART 100 UNIT/ML ~~LOC~~ SOLN
0.0000 [IU] | Freq: Three times a day (TID) | SUBCUTANEOUS | Status: DC
  Administered 2019-07-22 – 2019-07-23 (×2): 3 [IU] via SUBCUTANEOUS

## 2019-07-21 MED ORDER — ATORVASTATIN CALCIUM 40 MG PO TABS
40.0000 mg | ORAL_TABLET | Freq: Every day | ORAL | Status: DC
  Administered 2019-07-22 – 2019-07-23 (×2): 40 mg via ORAL
  Filled 2019-07-21 (×2): qty 1

## 2019-07-21 MED ORDER — ACETAMINOPHEN 160 MG/5ML PO SOLN: 650.0000 mg | ORAL | Status: DC | PRN

## 2019-07-21 MED ORDER — ACETAMINOPHEN 325 MG PO TABS: 650.0000 mg | ORAL_TABLET | ORAL | Status: DC | PRN

## 2019-07-21 MED ORDER — STROKE: EARLY STAGES OF RECOVERY BOOK: Freq: Once | Status: DC

## 2019-07-21 MED ORDER — INSULIN ASPART 100 UNIT/ML ~~LOC~~ SOLN: 0.0000 [IU] | Freq: Every day | SUBCUTANEOUS | Status: DC

## 2019-07-21 MED ORDER — LEVOTHYROXINE SODIUM 75 MCG PO TABS
150.0000 ug | ORAL_TABLET | Freq: Every day | ORAL | Status: DC
  Administered 2019-07-22 – 2019-07-23 (×2): 150 ug via ORAL
  Filled 2019-07-21 (×2): qty 2

## 2019-07-21 MED ORDER — CLOPIDOGREL BISULFATE 75 MG PO TABS
75.0000 mg | ORAL_TABLET | Freq: Every day | ORAL | Status: DC
  Administered 2019-07-22 – 2019-07-23 (×2): 75 mg via ORAL
  Filled 2019-07-21 (×2): qty 1

## 2019-07-21 MED ORDER — IOHEXOL 350 MG/ML SOLN
100.0000 mL | Freq: Once | INTRAVENOUS | Status: AC | PRN
  Administered 2019-07-21: 100 mL via INTRAVENOUS

## 2019-07-21 MED ORDER — ACETAMINOPHEN 650 MG RE SUPP: 650.0000 mg | RECTAL | Status: DC | PRN

## 2019-07-21 NOTE — H&P (Signed)
History and Physical    Priscilla GowdaRita L Queenan ZOX:[id number] DOB: Mar 06, 1935 DOA: 07/21/2019  PCP: Verlon AuBoyd, Tammy Lamonica, MD  Patient coming from: Home  I have personally briefly reviewed [facility]  Chief Complaint: AMS  HPI: Priscilla Tran is a 84 y.o. 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  Patient tells me that she was doing fine this morning and had breakfast and then started to feel nauseous, off-balance and a right-sided weakness/numbness which lasted for 15 minutes then its resolved.  she called her PCP who recommended to check her blood pressure or go to ED.  Patient called her cousin for blood pressure check as she was not able to hold anything in her hand. She was noted to be very confused by her cousin, agitated, delusional, make no sense, could not recognize anyone.  EMS was called-and brought patient to emergency department for further evaluation and management.    No history of headache, lightheadedness, dizziness, slurred speech, facial droop, head trauma, seizure, loss of consciousness, chest pain, shortness of breath, palpitation, nausea, vomiting, fever, chills, cough, congestion, urinary symptoms.  No history of smoking, alcohol, illicit drug use.  Patient recently hospitalized secondary to confusion in the setting of hypertension urgency.  Her MRI was negative.  She discharged yesterday in stable condition.  She was evaluated by neurology on previous admission.  ED Course: Upon arrival to ED: Patient's blood pressure was 173/93, afebrile with no leukocytosis, UA, PT/INR, ammonia, UA ethanol level, troponin, CT head all came back within normal limits.  CMP shows sodium of 133.  Her symptoms slowly improved however her daughter thinks that patient is still not at  baseline.  EDP consulted neurology recommended CTA head and neck.  [facility] hospital[facility] consulted for admission for AMS. Review of Systems: As per HPI otherwise negative.    Past Medical History:  Diagnosis Date  . Diabetes mellitus   . Disc degeneration, lumbar    Dr. Regino SchultzeWang  . Glaucoma associated with ocular inflammation, severe stage    blind Right eye  . Hyperlipidemia   . Hypertension   . Hypothyroid   . SBO (small bowel obstruction) s/p lap LOA [id number] 03/31/2011    Past Surgical History:  Procedure Laterality Date  . ANTERIOR CERVICAL DECOMPRESSION/DISCECTOMY FUSION 4 LEVELS N/A 09/21/2017   Procedure: Cervical three-four, Cervical four-five, Cervical five-six, Cervical six-seven Anterior Cervical Decompression Fusion;  Surgeon: Julio SicksPool, Henry, MD;  [facility];  Service: Neurosurgery;  Laterality: N/A;  . EYE SURGERY  2008   glaucoma right eye. Per patient she has had multiple surgeries on right eye, resulting in scar tissue.  Marland Kitchen. LAPAROSCOPY  04/04/2011   Procedure: LAPAROSCOPY DIAGNOSTIC;  Surgeon: Ardeth SportsmanSteven C. Gross, MD;  Location: [facility] ORS;  Service: General;  Laterality: N/A;  . NM MYOCAR PERF WALL MOTION  06/20/2010   Normal  . US ECHOCARDIOGRAPHY  05/06/2005   Mild MR, PI     reports that she has never smoked. She has never used smokeless tobacco. She reports current alcohol use. She reports that she does not use drugs.  Allergies  Allergen Reactions  . Lisinopril Swelling    FACE THROAT " Breathing not affected per patient."    Family History  Problem Relation Age of Onset  . Heart disease Mother   . Heart attack Mother   . Heart disease  Father   . Heart attack Father   . Cancer Brother        kidney    Prior to Admission medications   Medication Sig Start Date End Date Taking? Authorizing Provider  acetaminophen (TYLENOL) 325 MG tablet Take 2 tablets (650 mg total) by mouth every 4 (four) hours as needed for mild pain (or temp > 37.5 C (99.5 F)). 09/23/17  Yes  Emokpae, Courage, MD  amLODipine (NORVASC) 10 MG tablet Take 1 tablet (10 mg total) by mouth daily. 09/23/17  Yes Emokpae, Courage, MD  atorvastatin (LIPITOR) 40 MG tablet Take 1 tablet (40 mg total) by mouth daily. 07/20/19  Yes Hall, Carole N, DO  brimonidine (ALPHAGAN) 0.2 % ophthalmic solution Place 1 drop into the left eye in the morning and at bedtime. 07/12/19  Yes [provider]  carvedilol (COREG) 12.5 MG tablet Take 1 tablet (12.5 mg total) by mouth 2 (two) times daily with a meal. 09/23/17  Yes Emokpae, Courage, MD  clopidogrel (PLAVIX) 75 MG tablet TAKE 1 TABLET BY MOUTH DAILY (HOLD UNTIL 10/08/17) Patient taking differently: Take 75 mg by mouth daily.  12/21/18  Yes McCue, Shanda Bumps, NP  levothyroxine (SYNTHROID, LEVOTHROID) 150 MCG tablet Take 1 tablet (150 mcg total) by mouth daily. 09/23/17  Yes Shon Hale, MD  metFORMIN (GLUCOPHAGE) 1000 MG tablet Take 1 tablet (1,000 mg total) by mouth 2 (two) times daily with a meal. 09/23/17  Yes Emokpae, Courage, MD  prednisoLONE acetate (PRED FORTE) 1 % ophthalmic suspension Place 1 drop into the left eye 3 (three) times daily.    Yes [provider]  Cyanocobalamin (VITAMIN B-12 PO) Take 1 tablet by mouth daily. Patient not taking: Reported on 07/21/2019    [provider]    Physical Exam: Vitals:   07/21/19 1601 07/21/19 1627 07/21/19 1720 07/21/19 1801  BP:    129/89  Pulse:  72 71 86  Resp:  17 19 17   SpO2:  100% 99% 99%  Weight: 66.5 kg     Height: 5\' 4"  (1.626 m)       Constitutional: NAD, calm, comfortable, communicating well, alert and oriented x4. Eyes: Cataract in right eye.  Blind from right eye. ENMT: Mucous membranes are moist. Posterior pharynx clear of any exudate or lesions.Normal dentition.  Neck: normal, supple, no masses, no thyromegaly Respiratory: clear to auscultation bilaterally, no wheezing, no crackles. Normal respiratory effort. No accessory muscle use.  Cardiovascular: Regular  rate and rhythm, no murmurs / rubs / gallops. No extremity edema. 2+ pedal pulses. No carotid bruits.  Abdomen: no tenderness, no masses palpated. No hepatosplenomegaly. Bowel sounds positive.  Musculoskeletal: no clubbing / cyanosis. No joint deformity upper and lower extremities. Good ROM, no contractures. Normal muscle tone.  Skin: no rashes, lesions, ulcers. No induration Neurologic: CN 2-12 grossly intact. Sensation intact, DTR normal. Strength 5/5 in all 4.  Psychiatric: Normal judgment and insight. Alert and oriented x 3. Normal mood.    Labs on Admission: I have personally reviewed following labs and imaging studies  CBC: Recent Labs  Lab 07/19/19 1311 07/19/19 1422 07/21/19 1533  WBC  --  7.6 10.0  NEUTROABS  --  4.6 7.0  HGB 13.3 12.1 12.5  HCT 39.0 39.2 38.9  MCV  --  86.0 82.9  PLT  --  214 234   Basic Metabolic Panel: Recent Labs  Lab 07/19/19 1311 07/19/19 1422 07/21/19 1533  NA 137 137 133*  K 4.2 4.3 4.3  CL 100  102 98  CO2  --  23 22  GLUCOSE 108* 111* 132*  BUN 12 11 13   CREATININE 0.70 0.74 0.72  CALCIUM  --  9.7 9.5   GFR: Estimated Creatinine Clearance: 51.7 mL/min (by C-G formula based on SCr of 0.72 mg/dL). Liver Function Tests: Recent Labs  Lab 07/19/19 1422 07/21/19 1533  AST 21 23  ALT 11 14  ALKPHOS 65 62  BILITOT 1.0 1.0  PROT 6.9 7.3  ALBUMIN 4.2 4.3   No results for input(s): LIPASE, AMYLASE in the last 168 hours. Recent Labs  Lab 07/21/19 1548  AMMONIA 19   Coagulation Profile: Recent Labs  Lab 07/19/19 1422 07/21/19 1533  INR 1.0 0.9   Cardiac Enzymes: No results for input(s): CKTOTAL, CKMB, CKMBINDEX, TROPONINI in the last 168 hours. BNP (last 3 results) No results for input(s): PROBNP in the last 8760 hours. HbA1C: Recent Labs    07/20/19 0449  HGBA1C 6.4*   CBG: Recent Labs  Lab 07/19/19 1306 07/19/19 1648 07/20/19 0605 07/20/19 1113 07/21/19 1616  GLUCAP 94 115* 115* 124* 113*   Lipid  Profile: Recent Labs    07/20/19 0449  CHOL 254*  HDL 84  LDLCALC 145*  TRIG 124  CHOLHDL 3.0   Thyroid Function Tests: No results for input(s): TSH, T4TOTAL, FREET4, T3FREE, THYROIDAB in the last 72 hours. Anemia Panel: No results for input(s): VITAMINB12, FOLATE, FERRITIN, TIBC, IRON, RETICCTPCT in the last 72 hours. Urine analysis:    Component Value Date/Time   COLORURINE COLORLESS (A) 07/21/2019 1534   APPEARANCEUR CLEAR 07/21/2019 1534   LABSPEC 1.003 (L) 07/21/2019 1534   PHURINE 6.0 07/21/2019 1534   GLUCOSEU NEGATIVE 07/21/2019 1534   HGBUR NEGATIVE 07/21/2019 1534   BILIRUBINUR NEGATIVE 07/21/2019 1534   BILIRUBINUR neg 03/30/2011 1924   KETONESUR NEGATIVE 07/21/2019 1534   PROTEINUR NEGATIVE 07/21/2019 1534   UROBILINOGEN 0.2 03/31/2011 0807   NITRITE NEGATIVE 07/21/2019 1534   LEUKOCYTESUR NEGATIVE 07/21/2019 1534    Radiological Exams on Admission: CT Head Wo Contrast  Result Date: 07/21/2019 CLINICAL DATA:  Cephalopathy EXAM: CT HEAD WITHOUT CONTRAST TECHNIQUE: Contiguous axial images were obtained from the base of the skull through the vertex without intravenous contrast. COMPARISON:  07/19/2019 FINDINGS: Brain: No evidence of acute infarction, hemorrhage, hydrocephalus, extra-axial collection or mass lesion/mass effect. Mild-moderate low-density changes within the periventricular and subcortical white matter compatible with chronic microvascular ischemic change. Mild diffuse cerebral volume loss. Vascular: Atherosclerotic calcifications involving the large vessels of the skull base. No unexpected hyperdense vessel. Skull: Normal. Negative for fracture or focal lesion. Sinuses/Orbits: No acute findings Other: None. IMPRESSION: 1. No acute intracranial findings. 2. Chronic microvascular ischemic change and cerebral volume loss. Electronically Signed   By: 07/21/2019 D.O.   On: 07/21/2019 16:14   ECHOCARDIOGRAM COMPLETE  Result Date: 07/20/2019     ECHOCARDIOGRAM REPORT   Patient Name:   Tija LINEA CALLES Date of Exam: 07/20/2019 Medical Rec #:  07/22/2019      Height:       64.5 in Accession #:    [id number]     Weight:       146.6 lb Date of Birth:  1935/12/13      BSA:          1.724 m Patient Age:    83 years       BP:           152/71 mmHg Patient Gender: F  HR:           52 bpm. Exam Location:  Inpatient Procedure: 2D Echo, Cardiac Doppler and Color Doppler Indications:    TIA 435.9 / G45.9  History:        Patient has prior history of Echocardiogram examinations, most                 recent 08/23/2017. Risk Factors:Hypertension, Diabetes and                 Dyslipidemia. Hypothyroidism.  Sonographer:    Jonelle Sidle Dance Referring Phys: [id number] Ivins  1. Left ventricular ejection fraction, by estimation, is 60 to 65%. The left ventricle has normal function. The left ventricle has no regional wall motion abnormalities. Left ventricular diastolic parameters are consistent with Grade I diastolic dysfunction (impaired relaxation).  2. Right ventricular systolic function is normal. The right ventricular size is normal.  3. The mitral valve is normal in structure. Mild mitral valve regurgitation. No evidence of mitral stenosis.  4. The aortic valve is normal in structure. Aortic valve regurgitation is not visualized. No aortic stenosis is present. FINDINGS  Left Ventricle: Left ventricular ejection fraction, by estimation, is 60 to 65%. The left ventricle has normal function. The left ventricle has no regional wall motion abnormalities. The left ventricular internal cavity size was normal in size. There is  no left ventricular hypertrophy. Left ventricular diastolic parameters are consistent with Grade I diastolic dysfunction (impaired relaxation). Right Ventricle: The right ventricular size is normal. No increase in right ventricular wall thickness. Right ventricular systolic function is normal. Left Atrium: Left atrial size was normal  in size. Right Atrium: Right atrial size was normal in size. Pericardium: There is no evidence of pericardial effusion. Mitral Valve: The mitral valve is normal in structure. Mild mitral valve regurgitation. No evidence of mitral valve stenosis. Tricuspid Valve: The tricuspid valve is normal in structure. Tricuspid valve regurgitation is trivial. No evidence of tricuspid stenosis. Aortic Valve: The aortic valve is normal in structure. Aortic valve regurgitation is not visualized. No aortic stenosis is present. Pulmonic Valve: The pulmonic valve was normal in structure. Pulmonic valve regurgitation is not visualized. No evidence of pulmonic stenosis. Aorta: The aortic root and ascending aorta are structurally normal, with no evidence of dilitation. IAS/Shunts: The atrial septum is grossly normal.  LEFT VENTRICLE PLAX 2D LVIDd:         4.00 cm  Diastology LVIDs:         3.00 cm  LV e' lateral:   4.35 cm/s LV PW:         1.00 cm  LV E/e' lateral: 11.8 LV IVS:        1.00 cm  LV e' medial:    4.68 cm/s LVOT diam:     2.20 cm  LV E/e' medial:  11.0 LV SV:         67 LV SV Index:   39 LVOT Area:     3.80 cm  RIGHT VENTRICLE             IVC RV Basal diam:  2.90 cm     IVC diam: 1.40 cm RV S prime:     14.60 cm/s TAPSE (M-mode): 2.3 cm LEFT ATRIUM             Index       RIGHT ATRIUM           Index LA diam:  3.60 cm 2.09 cm/m  RA Area:     17.30 cm LA Vol (A2C):   48.7 ml 28.25 ml/m RA Volume:   42.10 ml  24.43 ml/m LA Vol (A4C):   53.8 ml 31.21 ml/m LA Biplane Vol: 53.8 ml 31.21 ml/m  AORTIC VALVE LVOT Vmax:   71.80 cm/s LVOT Vmean:  48.400 cm/s LVOT VTI:    0.175 m  AORTA Ao Root diam: 3.40 cm Ao Asc diam:  3.40 cm MITRAL VALVE MV Area (PHT): 2.11 cm    SHUNTS MV Decel Time: 359 msec    Systemic VTI:  0.18 m MV E velocity: 51.40 cm/s  Systemic Diam: 2.20 cm MV A velocity: 78.40 cm/s MV E/A ratio:  0.66 Kristeen Miss MD Electronically signed by Kristeen Miss MD Signature Date/Time: 07/20/2019/12:04:20 PM     Final    VAS US CAROTID  Result Date: 07/20/2019 Carotid Arterial Duplex Study Indications:       Suspected TIA with no acute infarct noted on CT. Other Factors:     Chronic microvascular ischemic changes. Comparison Study:  08/23/17 bilat 1-39% Performing Technologist: Jeb Levering RDMS, RVT  Examination Guidelines: A complete evaluation includes B-mode imaging, spectral Doppler, color Doppler, and power Doppler as needed of all accessible portions of each vessel. Bilateral testing is considered an integral part of a complete examination. Limited examinations for reoccurring indications may be performed as noted.  Right Carotid Findings: +----------+--------+--------+--------+------------------+------------------+           PSV cm/sEDV cm/sStenosisPlaque DescriptionComments           +----------+--------+--------+--------+------------------+------------------+ CCA Prox  59      18                                                   +----------+--------+--------+--------+------------------+------------------+ CCA Distal58      16                                                   +----------+--------+--------+--------+------------------+------------------+ ICA Prox  45      14      1-39%   heterogenous      intimal thickening +----------+--------+--------+--------+------------------+------------------+ ICA Distal70      22                                                   +----------+--------+--------+--------+------------------+------------------+ ECA       65      11                                                   +----------+--------+--------+--------+------------------+------------------+ +----------+--------+-------+----------------+-------------------+           PSV cm/sEDV cmsDescribe        Arm Pressure (mmHG) +----------+--------+-------+----------------+-------------------+ [id number]             Multiphasic, WNL                     +----------+--------+-------+----------------+-------------------+ +---------+--------+--+--------+--+---------+  VertebralPSV cm/s26EDV cm/s10Antegrade +---------+--------+--+--------+--+---------+  Left Carotid Findings: +----------+--------+--------+--------+------------------+--------+           PSV cm/sEDV cm/sStenosisPlaque DescriptionComments +----------+--------+--------+--------+------------------+--------+ CCA Prox  73      19                                         +----------+--------+--------+--------+------------------+--------+ CCA Distal59      21                                         +----------+--------+--------+--------+------------------+--------+ ICA Prox  122     33      1-39%   heterogenous               +----------+--------+--------+--------+------------------+--------+ ICA Distal65      22                                         +----------+--------+--------+--------+------------------+--------+ ECA       79      5                                          +----------+--------+--------+--------+------------------+--------+ +----------+--------+--------+----------------+-------------------+           PSV cm/sEDV cm/sDescribe        Arm Pressure (mmHG) +----------+--------+--------+----------------+-------------------+ [id number]             Multiphasic, WNL                    +----------+--------+--------+----------------+-------------------+ +---------+--------+--+--------+-+---------+ VertebralPSV cm/s45EDV cm/s9Antegrade +---------+--------+--+--------+-+---------+   Summary: Right Carotid: Velocities in the right ICA are consistent with a 1-39% stenosis. Left Carotid: Velocities in the left ICA are consistent with a 1-39% stenosis.  *See table(s) above for measurements and observations.  Electronically signed by Gretta Began MD on 07/20/2019 at 3:44:12 PM.    Final     EKG: Independently reviewed.  Sinus rhythm, no ST  elevation or depression noted.  Assessment/Plan Principal Problem:   AMS (altered mental status) Active Problems:   Hypothyroid   Diabetes type 2, controlled (HCC)   TIA (transient ischemic attack)   Hyperlipidemia    Acute encephalopathy: -Unknown etiology?  TIA? -CT head is negative.  Afebrile with no leukocytosis.  Initial labs such as CBC, CMP, UA, PT/INR, troponin, ammonia level, ethanol: WNL. -Admit patient on the floor.  On telemetry. -Appreciate neurology input -CTA head and neck is ordered and is pending. -Check B12, folate, UDS, RPR, TSH, EEG -Allow permissive hypertension.  Hold blood pressure medicine at this time. -Continue Plavix and statin. -We will keep her n.p.o. until she passes a bedside swallow evaluation. -Frequent neuro checks. -Consult PT/OT/SLP  Hypertension: Blood pressure elevated upon arrival.  Currently stable.   -Hold Coreg and amlodipine and monitor blood pressure closely  Hyperlipidemia: Continue statin  Hypothyroidism: Continue levothyroxine  Type 2 diabetes mellitus: Started on sliding scale insulin  Hyponatremia: Sodium 133 -repeat BMP tomorrow AM  DVT prophylaxis: Lovenox/SCD Code Status: Full code-confirmed with patient and her daughter  Family Communication: Patient's daughter present at bedside.  Plan of care discussed with patient and her daughter at bedside in  length and he verbalized understanding and agreed with it. Disposition Plan: Likely home in 2 days Consults called: Neurology by EDP Admission status: Inpatient   Ollen Bowl MD [facility]  If 7PM-7AM, please contact night-coverage [email or web address] Password [facility]  07/21/2019, 6:30 PM

## 2019-07-21 NOTE — ED Triage Notes (Signed)
Pt coming by EMS after experiencing AMS this evening. LKN reported by pt's neighbors @ 12:30pm. Pt keeps asking same questions over and over. Knows the year but keeps saying that she does not know how she got to the hospital or what is happening. Pt lives with daughter. Recently discharged from hospital with TIA dx

## 2019-07-21 NOTE — Consult Note (Addendum)
Neurology Consultation  Reason for Consult: Confusion/altered mental status Referring Physician: Dr. Melina Copa  CC: Altered mental status  History is obtained from: Daughter  HPI: Priscilla Tran is a 84 y.o. female with history of hypothyroidism, hypertension, hyperlipidemia, glaucoma, diabetes.  Patient was recently in the hospital secondary to confusion in the setting of hypertension urgency with normal MRI.  After day 2 patient had returned back to her baseline.  Patient returns to the hospital after being discharged yesterday secondary to having a very stereotypical episode of confusion however her blood pressure was not as significantly high.  Per patient, she recalls getting up, having breakfast and then starting to feel nauseous and her head and generalized weakness.  There is a period of time she does not recall.  Her PCP called the patient to check up on her and noted that she was very agitated and not making sense.  PCP then called the daughter who followed up and noted that she was very confused and agitated.  EMS was called and patient was brought to the hospital.  Over this period of time that she has been at the hospital she has slowly returned to her self but per daughter she still is not back to her baseline.   ED course  Relevant labs include - -Ethanol less than 10 -Ammonia 19 -Urinalysis negative -WBC 10 -Sodium 133 -Blood glucose 132  CT head shows-no acute intracranial findings   Past Medical History:  Diagnosis Date  . Diabetes mellitus   . Disc degeneration, lumbar    Dr. Mina Marble  . Glaucoma associated with ocular inflammation, severe stage    blind Right eye  . Hyperlipidemia   . Hypertension   . Hypothyroid   . SBO (small bowel obstruction) s/p lap LOA [id number] 03/31/2011   Family History  Problem Relation Age of Onset  . Heart disease Mother   . Heart attack Mother   . Heart disease Father   . Heart attack Father   . Cancer Brother        kidney     Social History:   reports that she has never smoked. She has never used smokeless tobacco. She reports current alcohol use. She reports that she does not use drugs.  Medications No current facility-administered medications for this encounter.  Current Outpatient Medications:  .  acetaminophen (TYLENOL) 325 MG tablet, Take 2 tablets (650 mg total) by mouth every 4 (four) hours as needed for mild pain (or temp > 37.5 C (99.5 F))., Disp: 30 tablet, Rfl: 1 .  amLODipine (NORVASC) 10 MG tablet, Take 1 tablet (10 mg total) by mouth daily., Disp: 30 tablet, Rfl: 1 .  atorvastatin (LIPITOR) 40 MG tablet, Take 1 tablet (40 mg total) by mouth daily., Disp: 90 tablet, Rfl: 0 .  brimonidine (ALPHAGAN) 0.2 % ophthalmic solution, Place 1 drop into the left eye in the morning and at bedtime., Disp: , Rfl:  .  carvedilol (COREG) 12.5 MG tablet, Take 1 tablet (12.5 mg total) by mouth 2 (two) times daily with a meal., Disp: 60 tablet, Rfl: 1 .  clopidogrel (PLAVIX) 75 MG tablet, TAKE 1 TABLET BY MOUTH DAILY (HOLD UNTIL 10/08/17) (Patient taking differently: Take 75 mg by mouth daily. ), Disp: 30 tablet, Rfl: 0 .  levothyroxine (SYNTHROID, LEVOTHROID) 150 MCG tablet, Take 1 tablet (150 mcg total) by mouth daily., Disp: 30 tablet, Rfl: 2 .  metFORMIN (GLUCOPHAGE) 1000 MG tablet, Take 1 tablet (1,000 mg total) by mouth 2 (two) times  daily with a meal., Disp: 60 tablet, Rfl: 2 .  prednisoLONE acetate (PRED FORTE) 1 % ophthalmic suspension, Place 1 drop into the left eye 3 (three) times daily. , Disp: , Rfl:  .  Cyanocobalamin (VITAMIN B-12 PO), Take 1 tablet by mouth daily. (Patient not taking: Reported on 07/21/2019), Disp: , Rfl:   ROS:  General ROS: negative for - chills, fatigue, fever, night sweats, weight gain or weight loss Psychological ROS: negative for - behavioral disorder, hallucinations, memory difficulties, mood swings or suicidal ideation Ophthalmic ROS: Positive for - blurry vision,  loss of  vision ENT ROS: negative for - epistaxis, nasal discharge, oral lesions, sore throat, tinnitus or vertigo Allergy and Immunology ROS: negative for - hives or itchy/watery eyes Hematological and Lymphatic ROS: negative for - bleeding problems, bruising or swollen lymph nodes Endocrine ROS: negative for - galactorrhea, hair pattern changes, polydipsia/polyuria or temperature intolerance Respiratory ROS: negative for - cough, hemoptysis, shortness of breath or wheezing Cardiovascular ROS: negative for - chest pain, dyspnea on exertion, edema or irregular heartbeat Gastrointestinal ROS: negative for - abdominal pain, diarrhea, hematemesis, nausea/vomiting or stool incontinence Genito-Urinary ROS: negative for - dysuria, hematuria, incontinence or urinary frequency/urgency Musculoskeletal ROS: negative for - joint swelling or muscular weakness Neurological ROS: as noted in HPI Dermatological ROS: negative for rash and skin lesion changes  Exam: Current vital signs: BP (!) 173/93   Pulse 72   Resp 17   Ht 5\' 4"  (1.626 m)   Wt 66.5 kg   SpO2 100%   BMI 25.16 kg/m  Vital signs in last 24 hours: Pulse Rate:  [69-87] 72 (06/24 1627) Resp:  [13-33] 17 (06/24 1627) BP: (173)/(93) 173/93 (06/24 1530) SpO2:  [95 %-100 %] 100 % (06/24 1627) Weight:  [66.5 kg] 66.5 kg (06/24 1601)   Constitutional: Appears well-developed and well-nourished.  Psych: Slightly anxious Eyes: No scleral injection HENT: No OP obstrucion Head: Normocephalic.  Cardiovascular: Palpable Respiratory: Effort normal, non-labored breathing GI: Soft.  No distension. There is no tenderness.  Skin: WDI  Neuro: Mental Status: Patient is awake, alert, oriented to person, place, month, year.  Speech is clear with no aphasia or dysarthria.  Patient is able to name 9 animals in 1 minute, she could not recall 3 objects, she was unable to tell me how many quarters are in $2.25.  She was able to give me some history however her  daughter did have to help  Cranial Nerves: II: Blind in the right eye.  Left eye visual fields intact III,IV, VI: EOMI without ptosis or diploplia. Pupils equal, round and reactive to light V: Facial sensation is symmetric to temperature VII: Facial movement is symmetric.  VIII: hearing is intact to voice X: Palat elevates symmetrically XI: Shoulder shrug is symmetric. XII: tongue is midline without atrophy or fasciculations.  Motor: Moving all extremities antigravity Sensory: Sensation is symmetric to light touch and temperature in the arms and legs. DSS Deep Tendon Reflexes: 2+ and symmetric in the biceps and patellae.  Plantars: Toes are downgoing bilaterally.  Cerebellar: FNF intact bilaterally  Labs I have reviewed labs in epic and the results pertinent to this consultation are:   CBC    Component Value Date/Time   WBC 10.0 07/21/2019 1533   RBC 4.69 07/21/2019 1533   HGB 12.5 07/21/2019 1533   HCT 38.9 07/21/2019 1533   PLT 234 07/21/2019 1533   MCV 82.9 07/21/2019 1533   MCV 82.3 03/30/2011 2035   MCH 26.7 07/21/2019 1533  MCHC 32.1 07/21/2019 1533   RDW 15.6 (H) 07/21/2019 1533   LYMPHSABS 1.9 07/21/2019 1533   MONOABS 0.8 07/21/2019 1533   EOSABS 0.2 07/21/2019 1533   BASOSABS 0.1 07/21/2019 1533    CMP     Component Value Date/Time   NA 133 (L) 07/21/2019 1533   K 4.3 07/21/2019 1533   CL 98 07/21/2019 1533   CO2 22 07/21/2019 1533   GLUCOSE 132 (H) 07/21/2019 1533   BUN 13 07/21/2019 1533   CREATININE 0.72 07/21/2019 1533   CALCIUM 9.5 07/21/2019 1533   PROT PENDING 07/21/2019 1533   ALBUMIN 4.3 07/21/2019 1533   AST 23 07/21/2019 1533   ALT 14 07/21/2019 1533   ALKPHOS 62 07/21/2019 1533   BILITOT 1.0 07/21/2019 1533   GFRNONAA >60 07/21/2019 1533   GFRAA >60 07/21/2019 1533    Lipid Panel     Component Value Date/Time   CHOL 254 (H) 07/20/2019 0449   TRIG 124 07/20/2019 0449   HDL 84 07/20/2019 0449   CHOLHDL 3.0 07/20/2019 0449    VLDL 25 07/20/2019 0449   LDLCALC 145 (H) 07/20/2019 0449     Imaging I have reviewed the images obtained:  CT-scan of the brain-no intracranial abnormalities    Felicie Morn PA-C [facility] [phone]  M-F  (9:00 am- 5:00 PM)  07/21/2019, 5:07 PM    NEUROHOSPITALIST ADDENDUM Performed a face to face diagnostic evaluation.   I have reviewed the contents of history and physical exam as documented by PA/ARNP/Resident and agree with above documentation.  I have discussed and formulated the above plan as documented. Edits to the note have been made as needed.   84 year old female who was recently discharged from the hospital due to acute confusion in the setting of blood pressure greater than 210 systolic-thought to be hypertensive emergency presents to the ED after PCP/neighbor  found her to be confused. Patient states that she had episode of right arm numbness and and was dropping things for about 15 minutes.  This was followed by blurry vision in her left eye (blind in her right eye) and episode of confusion.  Patient does not recollect anything but remembers events after arriving to the ED.  Daughter states that patient appeared confused and agitated and would continue to be nobody is there to help her.  No episode of loss of consciousness.  Blood pressure at the time was about 170 systolic.  On examination patient has no focal deficits are no motor weakness.  Vision left eye still appears impaired, which apparently is chronic-but can count fingers in all 4 quadrants at close distance.  Right eye blind.  Impression Acute encephalopathy Transient ischemic attack Less likely seizures  Recommendations -Observe for 24 hours -CTA head and neck to rule out significant carotid stenosis, intracranial atherosclerotic disease -BP up to 180 systolic -Continue antiplatelets, statin -EEG  -No need to repeat echo -A1c lipid profile -Swallow screen -Frequent  neurochecks   Georgiana Spinner Oskar Cretella MD [facility] [phone]   If 7pm to 7am, please call on call as listed on AMION.

## 2019-07-21 NOTE — ED Provider Notes (Signed)
[facility] EMERGENCY DEPARTMENT Provider Note   CSN: [id number] Arrival date & time: 07/21/19  1523     History No chief complaint on file.   Priscilla Tran is a 84 y.o. female.  She has a history of diabetes hypertension blindness with glaucoma.  Just admitted for acute confusion episode and discharged yesterday.  Patient arrives by ambulance for evaluation of confusion and elevated blood pressure.  Patient remembers this morning waking up and having breakfast.  She knows it is Thursday.  Does not recall any ambulance ride to the hospital and is unsure why she is here.  Denies any headache chest pain shortness of breath abdominal pain.  Is moving all 4 extremities without any limitations.  EMS states her blood sugar was normal and her blood pressure was elevated, combative with vital signs.  The history is provided by the patient and the EMS personnel.  Altered Mental Status Presenting symptoms: combativeness, confusion, disorientation and memory loss   Severity:  Severe Most recent episode:  Today Episode history:  Single Timing:  Constant Progression:  Unchanged Chronicity:  Recurrent Associated symptoms: visual change (chronic)   Associated symptoms: no abdominal pain, no difficulty breathing, no fever, no headaches, no nausea, no rash, no slurred speech and no vomiting        Past Medical History:  Diagnosis Date  . Diabetes mellitus   . Disc degeneration, lumbar    Dr. Regino Schultze  . Glaucoma associated with ocular inflammation, severe stage    blind Right eye  . Hyperlipidemia   . Hypertension   . Hypothyroid   . SBO (small bowel obstruction) s/p lap LOA [id number] 03/31/2011    Patient Active Problem List   Diagnosis Date Noted  . Hypertensive encephalopathy   . TIA (transient ischemic attack) 09/22/2017  . Cervical spinal cord compression (HCC) 09/15/2017  . Left-sided weakness 08/22/2017  . Chronic constipation 05/07/2011  . Diabetes type 2,  controlled (HCC) 03/31/2011  . Hypothyroid   . Glaucoma associated with ocular inflammation, severe stage   . Disc degeneration, lumbar   . Hypertension     Past Surgical History:  Procedure Laterality Date  . ANTERIOR CERVICAL DECOMPRESSION/DISCECTOMY FUSION 4 LEVELS N/A 09/21/2017   Procedure: Cervical three-four, Cervical four-five, Cervical five-six, Cervical six-seven Anterior Cervical Decompression Fusion;  Surgeon: Julio Sicks, MD;  Location: [facility] OR;  Service: Neurosurgery;  Laterality: N/A;  . EYE SURGERY  2008   glaucoma right eye. Per patient she has had multiple surgeries on right eye, resulting in scar tissue.  Marland Kitchen LAPAROSCOPY  04/04/2011   Procedure: LAPAROSCOPY DIAGNOSTIC;  Surgeon: Ardeth Sportsman, MD;  Location: [facility] ORS;  Service: General;  Laterality: N/A;  . NM MYOCAR PERF WALL MOTION  06/20/2010   Normal  . US ECHOCARDIOGRAPHY  05/06/2005   Mild MR, PI     OB History   No obstetric history on file.     Family History  Problem Relation Age of Onset  . Heart disease Mother   . Heart attack Mother   . Heart disease Father   . Heart attack Father   . Cancer Brother        kidney    Social History   Tobacco Use  . Smoking status: Never Smoker  . Smokeless tobacco: Never Used  Substance Use Topics  . Alcohol use: Yes    Comment: occasional/social on weekend  . Drug use: No    Home Medications Prior to Admission medications   Medication  Sig Start Date End Date Taking? Authorizing Provider  acetaminophen (TYLENOL) 325 MG tablet Take 2 tablets (650 mg total) by mouth every 4 (four) hours as needed for mild pain (or temp > 37.5 C (99.5 F)). 09/23/17   Emokpae, Courage, MD  amLODipine (NORVASC) 10 MG tablet Take 1 tablet (10 mg total) by mouth daily. 09/23/17   Shon Hale, MD  atorvastatin (LIPITOR) 40 MG tablet Take 1 tablet (40 mg total) by mouth daily. 07/20/19   Darlin Drop, DO  brimonidine (ALPHAGAN) 0.2 % ophthalmic solution Place 1 drop into the left  eye in the morning and at bedtime. 07/12/19   [provider]  carvedilol (COREG) 12.5 MG tablet Take 1 tablet (12.5 mg total) by mouth 2 (two) times daily with a meal. 09/23/17   Emokpae, Courage, MD  clopidogrel (PLAVIX) 75 MG tablet TAKE 1 TABLET BY MOUTH DAILY (HOLD UNTIL 10/08/17) Patient taking differently: Take 75 mg by mouth daily.  12/21/18   Ihor Austin, NP  Cyanocobalamin (VITAMIN B-12 PO) Take 1 tablet by mouth daily.    [provider]  levothyroxine (SYNTHROID, LEVOTHROID) 150 MCG tablet Take 1 tablet (150 mcg total) by mouth daily. Patient taking differently: Take 100 mcg by mouth daily.  09/23/17   Shon Hale, MD  metFORMIN (GLUCOPHAGE) 1000 MG tablet Take 1 tablet (1,000 mg total) by mouth 2 (two) times daily with a meal. 09/23/17   Emokpae, Courage, MD  prednisoLONE acetate (PRED FORTE) 1 % ophthalmic suspension Place 1 drop into the left eye 3 (three) times daily.     [provider]    Allergies    Lisinopril  Review of Systems   Review of Systems  Constitutional: Negative for fever.  HENT: Negative for sore throat.   Eyes: Negative for pain.  Respiratory: Negative for shortness of breath.   Cardiovascular: Negative for chest pain.  Gastrointestinal: Negative for abdominal pain, nausea and vomiting.  Genitourinary: Negative for dysuria.  Musculoskeletal: Negative for neck pain.  Skin: Negative for rash.  Neurological: Negative for headaches.  Psychiatric/Behavioral: Positive for confusion and memory loss.    Physical Exam Updated Vital Signs BP (!) 146/80   Pulse (!) 47   Temp 98.7 F (37.1 C) (Oral)   Resp 14   Ht 5\' 4"  (1.626 m)   Wt 66.5 kg   SpO2 94%   BMI 25.16 kg/m   Physical Exam Vitals and nursing note reviewed.  Constitutional:      General: She is not in acute distress.    Appearance: Normal appearance. She is well-developed.  HENT:     Head: Normocephalic and atraumatic.  Eyes:     Conjunctiva/sclera:  Conjunctivae normal.     Comments: Cloudy right pupil  Cardiovascular:     Rate and Rhythm: Normal rate and regular rhythm.     Heart sounds: No murmur heard.   Pulmonary:     Effort: Pulmonary effort is normal. No respiratory distress.     Breath sounds: Normal breath sounds. No stridor. No wheezing.  Abdominal:     Palpations: Abdomen is soft.     Tenderness: There is no abdominal tenderness. There is no guarding or rebound.  Musculoskeletal:        General: No tenderness. Normal range of motion.     Cervical back: Neck supple.  Skin:    General: Skin is warm and dry.     Capillary Refill: Capillary refill takes less than 2 seconds.  Neurological:  General: No focal deficit present.     Mental Status: She is alert. She is disoriented.     GCS: GCS eye subscore is 4. GCS verbal subscore is 5. GCS motor subscore is 6.     Sensory: No sensory deficit.     Motor: No weakness.     Comments: She is awake and alert.  She is somewhat restless but redirectable.  She does not know she is in a hospital and does not know how she got here.  Does know it is Thursday.  Remembers earlier events this morning.  Moving all extremities nonfocal he.  She does have cloudiness in her right eye and minimal visual perception.  No gross facial droop no slurred speech.     ED Results / Procedures / Treatments   Labs (all labs ordered are listed, but only abnormal results are displayed) Labs Reviewed  URINE CULTURE - Abnormal; Notable for the following components:      Result Value   Culture   (*)    Value: <10,000 COLONIES/mL INSIGNIFICANT GROWTH Performed at [facility], [address], [place], [place] [address]    All other components within normal limits  COMPREHENSIVE METABOLIC PANEL - Abnormal; Notable for the following components:   Sodium 133 (*)    Glucose, Bld 132 (*)    All other components within normal limits  CBC WITH DIFFERENTIAL/PLATELET - Abnormal; Notable for the  following components:   RDW 15.6 (*)    All other components within normal limits  URINALYSIS, ROUTINE W REFLEX MICROSCOPIC - Abnormal; Notable for the following components:   Color, Urine COLORLESS (*)    Specific Gravity, Urine 1.003 (*)    All other components within normal limits  CBC - Abnormal; Notable for the following components:   Hemoglobin 11.8 (*)    All other components within normal limits  CBC WITH DIFFERENTIAL/PLATELET - Abnormal; Notable for the following components:   Hemoglobin 11.4 (*)    HCT 35.3 (*)    All other components within normal limits  BASIC METABOLIC PANEL - Abnormal; Notable for the following components:   Glucose, Bld 135 (*)    All other components within normal limits  LIPID PANEL - Abnormal; Notable for the following components:   Cholesterol 217 (*)    LDL Cholesterol 125 (*)    All other components within normal limits  CBG MONITORING, ED - Abnormal; Notable for the following components:   Glucose-Capillary 113 (*)    All other components within normal limits  CBG MONITORING, ED - Abnormal; Notable for the following components:   Glucose-Capillary 129 (*)    All other components within normal limits  CBG MONITORING, ED - Abnormal; Notable for the following components:   Glucose-Capillary 114 (*)    All other components within normal limits  SARS CORONAVIRUS 2 BY RT PCR (HOSPITAL ORDER, PERFORMED IN [facility])  AMMONIA  ETHANOL  PROTIME-INR  CREATININE, SERUM  VITAMIN B12  FOLATE  TSH  URINE DRUGS OF ABUSE SCREEN W ALC, ROUTINE (REF LAB)  RPR  HEMOGLOBIN A1C  TROPONIN I (HIGH SENSITIVITY)  TROPONIN I (HIGH SENSITIVITY)    EKG EKG Interpretation  Date/Time:  Thursday July 21 2019 15:41:01 EDT Ventricular Rate:  71 PR Interval:    QRS Duration: 97 QT Interval:  406 QTC Calculation: 442 R Axis:   -59 Text Interpretation: Sinus rhythm Left anterior fascicular block Abnormal R-wave progression, late transition No  significant change since prior  2 days ago Confirmed by Meridee ScoreButler, Selden Noteboom [phone]([id number]) on 07/21/2019 3:44:24 PM   Radiology CT Head Wo Contrast  Result Date: 07/21/2019 CLINICAL DATA:  Cephalopathy EXAM: CT HEAD WITHOUT CONTRAST TECHNIQUE: Contiguous axial images were obtained from the base of the skull through the vertex without intravenous contrast. COMPARISON:  07/19/2019 FINDINGS: Brain: No evidence of acute infarction, hemorrhage, hydrocephalus, extra-axial collection or mass lesion/mass effect. Mild-moderate low-density changes within the periventricular and subcortical white matter compatible with chronic microvascular ischemic change. Mild diffuse cerebral volume loss. Vascular: Atherosclerotic calcifications involving the large vessels of the skull base. No unexpected hyperdense vessel. Skull: Normal. Negative for fracture or focal lesion. Sinuses/Orbits: No acute findings Other: None. IMPRESSION: 1. No acute intracranial findings. 2. Chronic microvascular ischemic change and cerebral volume loss. Electronically Signed   By: Duanne GuessNicholas  Plundo D.O.   On: 07/21/2019 16:14   CT ANGIO NECK W OR WO CONTRAST  Result Date: 07/21/2019 CLINICAL DATA:  Altered mental status.  Rule out stroke. EXAM: CT ANGIOGRAPHY HEAD AND NECK TECHNIQUE: Multidetector CT imaging of the head and neck was performed using the standard protocol during bolus administration of intravenous contrast. Multiplanar CT image reconstructions and MIPs were obtained to evaluate the vascular anatomy. Carotid stenosis measurements (when applicable) are obtained utilizing NASCET criteria, using the distal internal carotid diameter as the denominator. CONTRAST:  100mL OMNIPAQUE IOHEXOL 350 MG/ML SOLN COMPARISON:  CT head 07/21/2019.  MRI head 07/19/2019 FINDINGS: CTA NECK FINDINGS Aortic arch: Standard branching. Imaged portion shows no evidence of aneurysm or dissection. No significant stenosis of the major arch vessel origins. Right carotid system:  Mild atherosclerotic disease in the right carotid bifurcation without significant stenosis or irregularity Left carotid system: Mild atherosclerotic disease left carotid bifurcation. Calcification left carotid bulb. Less than 25% diameter stenosis proximal left internal carotid artery. No irregularity. Vertebral arteries: Both vertebral arteries are patent to the basilar. Moderate calcific stenosis at the origin of the right vertebral artery. No left vertebral artery stenosis. Skeleton: ACDF C3 through C7 with solid fusion. No acute skeletal abnormality. Extensive degenerative change at C1-2 extending to the left. Other neck: Negative for mass or adenopathy. Upper chest: Lung apices clear bilaterally. Review of the MIP images confirms the above findings CTA HEAD FINDINGS Anterior circulation: Mild atherosclerotic disease in the cavernous carotid bilaterally without significant stenosis. Anterior and middle cerebral arteries patent bilaterally without stenosis or large vessel occlusion. Posterior circulation: Both vertebral arteries patent to the basilar. PICA patent bilaterally. Basilar widely patent. Basilar ends in the superior cerebellar arteries bilaterally which are patent. Fetal origin of the posterior cerebral artery bilaterally without significant stenosis. Venous sinuses: Normal venous enhancement. Anatomic variants: None Review of the MIP images confirms the above findings IMPRESSION: Mild atherosclerotic disease in the carotid bifurcation bilaterally without significant stenosis Moderate stenosis origin of right vertebral artery which is then patent to the basilar. No significant left vertebral artery stenosis Negative for intracranial large vessel occlusion or flow limiting stenosis. Electronically Signed   By: Marlan Palauharles  Clark M.D.   On: 07/21/2019 19:29   ECHOCARDIOGRAM COMPLETE  Result Date: 07/20/2019    ECHOCARDIOGRAM REPORT   Patient Name:   Ameila Dulcy FannyL Behrendt Date of Exam: 07/20/2019 Medical Rec #:   [id number]      Height:       64.5 in Accession #:    [id number]     Weight:       146.6 lb Date of Birth:  [birth year]      BSA:  1.724 m Patient Age:    83 years       BP:           152/71 mmHg Patient Gender: F              HR:           52 bpm. Exam Location:  Inpatient Procedure: 2D Echo, Cardiac Doppler and Color Doppler Indications:    TIA 435.9 / G45.9  History:        Patient has prior history of Echocardiogram examinations, most                 recent 08/23/2017. Risk Factors:Hypertension, Diabetes and                 Dyslipidemia. Hypothyroidism.  Sonographer:    Elmarie Shiley Dance Referring Phys: [id number] Emeline General IMPRESSIONS  1. Left ventricular ejection fraction, by estimation, is 60 to 65%. The left ventricle has normal function. The left ventricle has no regional wall motion abnormalities. Left ventricular diastolic parameters are consistent with Grade I diastolic dysfunction (impaired relaxation).  2. Right ventricular systolic function is normal. The right ventricular size is normal.  3. The mitral valve is normal in structure. Mild mitral valve regurgitation. No evidence of mitral stenosis.  4. The aortic valve is normal in structure. Aortic valve regurgitation is not visualized. No aortic stenosis is present. FINDINGS  Left Ventricle: Left ventricular ejection fraction, by estimation, is 60 to 65%. The left ventricle has normal function. The left ventricle has no regional wall motion abnormalities. The left ventricular internal cavity size was normal in size. There is  no left ventricular hypertrophy. Left ventricular diastolic parameters are consistent with Grade I diastolic dysfunction (impaired relaxation). Right Ventricle: The right ventricular size is normal. No increase in right ventricular wall thickness. Right ventricular systolic function is normal. Left Atrium: Left atrial size was normal in size. Right Atrium: Right atrial size was normal in size. Pericardium: There is no evidence  of pericardial effusion. Mitral Valve: The mitral valve is normal in structure. Mild mitral valve regurgitation. No evidence of mitral valve stenosis. Tricuspid Valve: The tricuspid valve is normal in structure. Tricuspid valve regurgitation is trivial. No evidence of tricuspid stenosis. Aortic Valve: The aortic valve is normal in structure. Aortic valve regurgitation is not visualized. No aortic stenosis is present. Pulmonic Valve: The pulmonic valve was normal in structure. Pulmonic valve regurgitation is not visualized. No evidence of pulmonic stenosis. Aorta: The aortic root and ascending aorta are structurally normal, with no evidence of dilitation. IAS/Shunts: The atrial septum is grossly normal.  LEFT VENTRICLE PLAX 2D LVIDd:         4.00 cm  Diastology LVIDs:         3.00 cm  LV e' lateral:   4.35 cm/s LV PW:         1.00 cm  LV E/e' lateral: 11.8 LV IVS:        1.00 cm  LV e' medial:    4.68 cm/s LVOT diam:     2.20 cm  LV E/e' medial:  11.0 LV SV:         67 LV SV Index:   39 LVOT Area:     3.80 cm  RIGHT VENTRICLE             IVC RV Basal diam:  2.90 cm     IVC diam: 1.40 cm RV S prime:     14.60 cm/s TAPSE (  M-mode): 2.3 cm LEFT ATRIUM             Index       RIGHT ATRIUM           Index LA diam:        3.60 cm 2.09 cm/m  RA Area:     17.30 cm LA Vol (A2C):   48.7 ml 28.25 ml/m RA Volume:   42.10 ml  24.43 ml/m LA Vol (A4C):   53.8 ml 31.21 ml/m LA Biplane Vol: 53.8 ml 31.21 ml/m  AORTIC VALVE LVOT Vmax:   71.80 cm/s LVOT Vmean:  48.400 cm/s LVOT VTI:    0.175 m  AORTA Ao Root diam: 3.40 cm Ao Asc diam:  3.40 cm MITRAL VALVE MV Area (PHT): 2.11 cm    SHUNTS MV Decel Time: 359 msec    Systemic VTI:  0.18 m MV E velocity: 51.40 cm/s  Systemic Diam: 2.20 cm MV A velocity: 78.40 cm/s MV E/A ratio:  0.66 Mertie Moores MD Electronically signed by Mertie Moores MD Signature Date/Time: 07/20/2019/12:04:20 PM    Final    VAS US CAROTID  Result Date: 07/20/2019 Carotid Arterial Duplex Study Indications:        Suspected TIA with no acute infarct noted on CT. Other Factors:     Chronic microvascular ischemic changes. Comparison Study:  08/23/17 bilat 1-39% Performing Technologist: June Leap RDMS, RVT  Examination Guidelines: A complete evaluation includes B-mode imaging, spectral Doppler, color Doppler, and power Doppler as needed of all accessible portions of each vessel. Bilateral testing is considered an integral part of a complete examination. Limited examinations for reoccurring indications may be performed as noted.  Right Carotid Findings: +----------+--------+--------+--------+------------------+------------------+           PSV cm/sEDV cm/sStenosisPlaque DescriptionComments           +----------+--------+--------+--------+------------------+------------------+ CCA Prox  59      18                                                   +----------+--------+--------+--------+------------------+------------------+ CCA Distal58      16                                                   +----------+--------+--------+--------+------------------+------------------+ ICA Prox  45      14      1-39%   heterogenous      intimal thickening +----------+--------+--------+--------+------------------+------------------+ ICA Distal70      22                                                   +----------+--------+--------+--------+------------------+------------------+ ECA       65      11                                                   +----------+--------+--------+--------+------------------+------------------+ +----------+--------+-------+----------------+-------------------+           PSV  cm/sEDV cmsDescribe        Arm Pressure (mmHG) +----------+--------+-------+----------------+-------------------+ [id number]             Multiphasic, WNL                    +----------+--------+-------+----------------+-------------------+ +---------+--------+--+--------+--+---------+  VertebralPSV cm/s26EDV cm/s10Antegrade +---------+--------+--+--------+--+---------+  Left Carotid Findings: +----------+--------+--------+--------+------------------+--------+           PSV cm/sEDV cm/sStenosisPlaque DescriptionComments +----------+--------+--------+--------+------------------+--------+ CCA Prox  73      19                                         +----------+--------+--------+--------+------------------+--------+ CCA Distal59      21                                         +----------+--------+--------+--------+------------------+--------+ ICA Prox  122     33      1-39%   heterogenous               +----------+--------+--------+--------+------------------+--------+ ICA Distal65      22                                         +----------+--------+--------+--------+------------------+--------+ ECA       79      5                                          +----------+--------+--------+--------+------------------+--------+ +----------+--------+--------+----------------+-------------------+           PSV cm/sEDV cm/sDescribe        Arm Pressure (mmHG) +----------+--------+--------+----------------+-------------------+ [id number]             Multiphasic, WNL                    +----------+--------+--------+----------------+-------------------+ +---------+--------+--+--------+-+---------+ VertebralPSV cm/s45EDV cm/s9Antegrade +---------+--------+--+--------+-+---------+   Summary: Right Carotid: Velocities in the right ICA are consistent with a 1-39% stenosis. Left Carotid: Velocities in the left ICA are consistent with a 1-39% stenosis.  *See table(s) above for measurements and observations.  Electronically signed by Gretta Began MD on 07/20/2019 at 3:44:12 PM.    Final    CT ANGIO HEAD CODE STROKE  Result Date: 07/21/2019 CLINICAL DATA:  Altered mental status.  Rule out stroke. EXAM: CT ANGIOGRAPHY HEAD AND NECK TECHNIQUE:  Multidetector CT imaging of the head and neck was performed using the standard protocol during bolus administration of intravenous contrast. Multiplanar CT image reconstructions and MIPs were obtained to evaluate the vascular anatomy. Carotid stenosis measurements (when applicable) are obtained utilizing NASCET criteria, using the distal internal carotid diameter as the denominator. CONTRAST:  OMNIPAQUE IOHEXOL 350 MG/ML SOLN COMPARISON:  CT head 07/21/2019.  MRI head 07/19/2019 FINDINGS: CTA NECK FINDINGS Aortic arch: Standard branching. Imaged portion shows no evidence of aneurysm or dissection. No significant stenosis of the major arch vessel origins. Right carotid system: Mild atherosclerotic disease in the right carotid bifurcation without significant stenosis or irregularity Left carotid system: Mild atherosclerotic disease left carotid bifurcation. Calcification left carotid bulb. Less than 25% diameter stenosis proximal left internal carotid artery. No irregularity. Vertebral arteries: Both vertebral  arteries are patent to the basilar. Moderate calcific stenosis at the origin of the right vertebral artery. No left vertebral artery stenosis. Skeleton: ACDF C3 through C7 with solid fusion. No acute skeletal abnormality. Extensive degenerative change at C1-2 extending to the left. Other neck: Negative for mass or adenopathy. Upper chest: Lung apices clear bilaterally. Review of the MIP images confirms the above findings CTA HEAD FINDINGS Anterior circulation: Mild atherosclerotic disease in the cavernous carotid bilaterally without significant stenosis. Anterior and middle cerebral arteries patent bilaterally without stenosis or large vessel occlusion. Posterior circulation: Both vertebral arteries patent to the basilar. PICA patent bilaterally. Basilar widely patent. Basilar ends in the superior cerebellar arteries bilaterally which are patent. Fetal origin of the posterior cerebral artery bilaterally  without significant stenosis. Venous sinuses: Normal venous enhancement. Anatomic variants: None Review of the MIP images confirms the above findings IMPRESSION: Mild atherosclerotic disease in the carotid bifurcation bilaterally without significant stenosis Moderate stenosis origin of right vertebral artery which is then patent to the basilar. No significant left vertebral artery stenosis Negative for intracranial large vessel occlusion or flow limiting stenosis. Electronically Signed   By: Marlan Palau M.D.   On: 07/21/2019 19:29    Procedures Procedures (including critical care time)  Medications Ordered in ED Medications   stroke: mapping our early stages of recovery book (0 each Does not apply Hold 07/22/19 0556)  acetaminophen (TYLENOL) tablet 650 mg (has no administration in time range)    Or  acetaminophen (TYLENOL) 160 MG/5ML solution 650 mg (has no administration in time range)    Or  acetaminophen (TYLENOL) suppository 650 mg (has no administration in time range)  enoxaparin (LOVENOX) injection 40 mg (40 mg Subcutaneous Refused 07/22/19 0053)  atorvastatin (LIPITOR) tablet 40 mg (has no administration in time range)  levothyroxine (SYNTHROID) tablet 150 mcg (150 mcg Oral Given 07/22/19 0735)  clopidogrel (PLAVIX) tablet 75 mg (has no administration in time range)  insulin aspart (novoLOG) injection 0-15 Units (0 Units Subcutaneous Not Given 07/22/19 0810)  insulin aspart (novoLOG) injection 0-5 Units (0 Units Subcutaneous Hold 07/21/19 2123)  iohexol (OMNIPAQUE) 350 MG/ML injection 100 mL (100 mLs Intravenous Contrast Given 07/21/19 1916)    ED Course  I have reviewed the triage vital signs and the nursing notes.  Pertinent labs & imaging results that were available during my care of the patient were reviewed by me and considered in my medical decision making (see chart for details).  Clinical Course as of Jul 21 912  Thu Jul 21, 2019  1544 Discussed with neuro hospitalist Dr.  Laurence Slate, who had actually consult on this patient during her last admission.  He recommends Noncon CT and not a full stroke activation.  Will see in consult.   [MB]  1728 Reevaluated patient-she is returned to her normal mental status.  Amnestic to prior events today.   [MB]    Clinical Course User Index [MB] Terrilee Files, MD   MDM Rules/Calculators/A&P                         This patient complains of confusion and combativeness; this involves an extensive number of treatment Options and is a complaint that carries with it a high risk of complications and Morbidity. The differential includes stroke, TIA, delirium infection, bleed hypertensive emergency, hypoglycemia  I ordered, reviewed and interpreted labs, which included CBC with normal white count, stable low hemoglobin, normal chemistries other than mildly elevated glucose, negative troponin,  negative alcohol elevated blood sugar I ordered imaging studies which included CT head and I independently    visualized and interpreted imaging which showed microvascular changes no acute infarct or bleed Additional history obtained from patient's daughter Previous records obtained and reviewed in epic including most recent admission I consulted Dr. Laurence Slate neurology and [facility] hospitalist Dr Jacqulyn Bath and discussed lab and imaging findings  Critical Interventions: None  After the interventions stated above, I reevaluated the patient and found patient's mental status to be improved.  Neurology recommendations are for admission to the hospital for continued work-up.   Final Clinical Impression(s) / ED Diagnoses Final diagnoses:  Altered mental status, unspecified altered mental status type    Rx / DC Orders ED Discharge Orders    None       Terrilee Files, MD 07/22/19 [phone]

## 2019-07-22 ENCOUNTER — Inpatient Hospital Stay (HOSPITAL_COMMUNITY): Payer: Medicare HMO

## 2019-07-22 DIAGNOSIS — R4182 Altered mental status, unspecified: Secondary | ICD-10-CM

## 2019-07-22 DIAGNOSIS — E119 Type 2 diabetes mellitus without complications: Secondary | ICD-10-CM

## 2019-07-22 LAB — CBC WITH DIFFERENTIAL/PLATELET
Abs Immature Granulocytes: 0.02 10*3/uL (ref 0.00–0.07)
Basophils Absolute: 0 10*3/uL (ref 0.0–0.1)
Basophils Relative: 0 %
Eosinophils Absolute: 0.1 10*3/uL (ref 0.0–0.5)
Eosinophils Relative: 2 %
HCT: 35.3 % — ABNORMAL LOW (ref 36.0–46.0)
Hemoglobin: 11.4 g/dL — ABNORMAL LOW (ref 12.0–15.0)
Immature Granulocytes: 0 %
Lymphocytes Relative: 29 %
Lymphs Abs: 2 10*3/uL (ref 0.7–4.0)
MCH: 26.6 pg (ref 26.0–34.0)
MCHC: 32.3 g/dL (ref 30.0–36.0)
MCV: 82.3 fL (ref 80.0–100.0)
Monocytes Absolute: 0.7 10*3/uL (ref 0.1–1.0)
Monocytes Relative: 11 %
Neutro Abs: 4 10*3/uL (ref 1.7–7.7)
Neutrophils Relative %: 58 %
Platelets: 211 10*3/uL (ref 150–400)
RBC: 4.29 MIL/uL (ref 3.87–5.11)
RDW: 15.5 % (ref 11.5–15.5)
WBC: 6.9 10*3/uL (ref 4.0–10.5)
nRBC: 0 % (ref 0.0–0.2)

## 2019-07-22 LAB — URINE CULTURE: Culture: <10000 — AB

## 2019-07-22 LAB — BASIC METABOLIC PANEL
Anion gap: 11 (ref 5–15)
BUN: 9 mg/dL (ref 8–23)
CO2: 25 mmol/L (ref 22–32)
Calcium: 9.3 mg/dL (ref 8.9–10.3)
Chloride: 100 mmol/L (ref 98–111)
Creatinine, Ser: 0.58 mg/dL (ref 0.44–1.00)
GFR calc Af Amer: >60 mL/min (ref >60)
GFR calc non Af Amer: >60 mL/min (ref >60)
Glucose, Bld: 135 mg/dL — ABNORMAL HIGH (ref 70–99)
Potassium: 3.5 mmol/L (ref 3.5–5.1)
Sodium: 136 mmol/L (ref 135–145)

## 2019-07-22 LAB — HEMOGLOBIN A1C
Hgb A1c MFr Bld: 6.4 % — ABNORMAL HIGH (ref 4.8–5.6)
Mean Plasma Glucose: 136.98 mg/dL

## 2019-07-22 LAB — GLUCOSE, CAPILLARY
Glucose-Capillary: 165 mg/dL — ABNORMAL HIGH (ref 70–99)
Glucose-Capillary: 127 mg/dL — ABNORMAL HIGH (ref 70–99)

## 2019-07-22 LAB — CBG MONITORING, ED
Glucose-Capillary: 114 mg/dL — ABNORMAL HIGH (ref 70–99)
Glucose-Capillary: 120 mg/dL — ABNORMAL HIGH (ref 70–99)

## 2019-07-22 LAB — RPR: RPR Ser Ql: NONREACTIVE

## 2019-07-22 MED ORDER — CARVEDILOL 12.5 MG PO TABS
12.5000 mg | ORAL_TABLET | Freq: Two times a day (BID) | ORAL | Status: DC
  Administered 2019-07-22 – 2019-07-23 (×2): 12.5 mg via ORAL
  Filled 2019-07-22 (×2): qty 1

## 2019-07-22 NOTE — ED Notes (Signed)
PT eval in progress 

## 2019-07-22 NOTE — Procedures (Addendum)
Patient Name: Priscilla Tran  MRN: [id number]  Epilepsy Attending: Charlsie Quest  Referring Physician/Provider: Delton See, PA Date: 07/22/2019 Duration: 23.26 minutes  Patient history: 84 year old female with an episode of right arm numbness and dropping things followed by blurry vision in the left eye (blind in right eye) and episode of confusion.  EEG evaluate for seizures.  Level of alertness: Awake, drowsy  AEDs during EEG study: None  Technical aspects: This EEG study was done with scalp electrodes positioned according to the 10-20 International system of electrode placement. Electrical activity was acquired at a sampling rate of 500Hz  and reviewed with a high frequency filter of 70Hz  and a low frequency filter of 1Hz . EEG data were recorded continuously and digitally stored.   Description: The posterior dominant rhythm consists of 8 Hz activity of moderate voltage (25-35 uV) seen predominantly in posterior head regions, symmetric and reactive to eye opening and eye closing. Drowsiness was characterized by attenuation of the posterior background rhythm.  Single sharp transient was noticed in left frontotemporal region, maximal F7. Hyperventilation and photic stimulation were not performed.     IMPRESSION: This study is within normal limits. No seizures or epileptiform discharges were seen throughout the recording. However, only wakefulness and drowsiness were recorded. If suspicion for interictal activity remains a concern, a prolonged study including sleep can be considered.    Carrel Leather 

## 2019-07-22 NOTE — Progress Notes (Signed)
EEG complete - results pending 

## 2019-07-22 NOTE — ED Notes (Signed)
Lunch Tray Ordered @ 1048. °

## 2019-07-22 NOTE — ED Notes (Signed)
Stroke MD at bedside

## 2019-07-22 NOTE — Progress Notes (Signed)
STROKE TEAM PROGRESS NOTE   HISTORY OF PRESENT ILLNESS  I have personally reviewed history of presenting illness with the patient as well as with the daughter was present at the bedside, reviewed electronic medical records and imaging films in PACS.  Patient was initially admitted to the hospital 3 days ago with episode of transient confusion in setting of significantly elevated blood pressure and at that time MRI scan of the brain was negative and she returned back to baseline.  She returned yesterday on 07/21/2019 with an episode of transient right hand weakness numbness and difficulty using it which lasted about 20 minutes at around 3:30 PM..  She appeared slightly confused during this but returned back to baseline quickly.  This time blood pressure was not significantly elevated. She denies any prior history of stroke, TIA or seizures. Patient had TIA work-up on 07/20/2019 including MRI brain which was normal and CT angiogram brain showing no significant stenosis and carotid ultrasound and 2D echo being unremarkable. LSN 330 pm on 07/21/2019  Baseline MRS 1 OBJECTIVE Vitals:   07/22/19 0600 07/22/19 0700 07/22/19 0800 07/22/19 1000  BP: 130/62 (!) 144/71 (!) 146/80 129/64  Pulse: (!) 46 60 (!) 47 64  Resp: 14 15 14 18   Temp:      TempSrc:      SpO2: 95% 99% 94% 100%  Weight:      Height:        CBC:  Recent Labs  Lab 07/21/19 1533 07/21/19 1533 07/21/19 1944 07/22/19 0228  WBC 10.0   < > 8.3 6.9  NEUTROABS 7.0  --   --  4.0  HGB 12.5   < > 11.8* 11.4*  HCT 38.9   < > 36.6 35.3*  MCV 82.9   < > 83.4 82.3  PLT 234   < > 230 211   < > = values in this interval not displayed.    Basic Metabolic Panel:  Recent Labs  Lab 07/21/19 1533 07/21/19 1533 07/21/19 1944 07/22/19 0228  NA 133*  --   --  136  K 4.3  --   --  3.5  CL 98  --   --  100  CO2 22  --   --  25  GLUCOSE 132*  --   --  135*  BUN 13  --   --  9  CREATININE 0.72   < > 0.69 0.58  CALCIUM 9.5  --   --  9.3   <  > = values in this interval not displayed.    Lipid Panel:     Component Value Date/Time   CHOL 217 (H) 07/21/2019 1944   TRIG 71 07/21/2019 1944   HDL 78 07/21/2019 1944   CHOLHDL 2.8 07/21/2019 1944   VLDL 14 07/21/2019 1944   LDLCALC 125 (H) 07/21/2019 1944   HgbA1c:  Lab Results  Component Value Date   HGBA1C 6.4 (H) 07/22/2019   Urine Drug Screen:     Component Value Date/Time   LABOPIA NONE DETECTED 07/19/2019 1430   COCAINSCRNUR NONE DETECTED 07/19/2019 1430   LABBENZ NONE DETECTED 07/19/2019 1430   AMPHETMU NONE DETECTED 07/19/2019 1430   THCU NONE DETECTED 07/19/2019 1430   LABBARB NONE DETECTED 07/19/2019 1430    Alcohol Level     Component Value Date/Time   ETH <10 07/21/2019 1548    IMAGING  CT Head Wo Contrast 07/21/2019 IMPRESSION:  1. No acute intracranial findings.  2. Chronic microvascular ischemic change and cerebral volume  loss.   CT ANGIO HEAD CODE STROKE CT ANGIO NECK W OR WO CONTRAST 07/21/2019 IMPRESSION:  Mild atherosclerotic disease in the carotid bifurcation bilaterally without significant stenosis. Moderate stenosis origin of right vertebral artery which is then patent to the basilar. No significant left vertebral artery stenosis Negative for intracranial large vessel occlusion or flow limiting stenosis.   MRI Brain WO Contrast 07/19/19 IMPRESSION: No acute abnormality.  Negative for PRES. Atrophy and moderate chronic microvascular ischemic changes.  VAS US CAROTID 07/20/2019 Summary:  Right Carotid: Velocities in the right ICA are consistent with a 1-39% stenosis.  Left Carotid: Velocities in the left ICA are consistent with a 1-39% stenosis.    Transthoracic Echocardiogram  07/20/19 IMPRESSIONS  1. Left ventricular ejection fraction, by estimation, is 60 to 65%. The  left ventricle has normal function. The left ventricle has no regional  wall motion abnormalities. Left ventricular diastolic parameters are  consistent with Grade  I diastolic  dysfunction (impaired relaxation).  2. Right ventricular systolic function is normal. The right ventricular  size is normal.  3. The mitral valve is normal in structure. Mild mitral valve  regurgitation. No evidence of mitral stenosis.  4. The aortic valve is normal in structure. Aortic valve regurgitation is  not visualized. No aortic stenosis is present.  ECG - SR rate 61 BPM. (See cardiology reading for complete details)  PHYSICAL EXAM Blood pressure 129/64, pulse 64, temperature 98.7 F (37.1 C), temperature source Oral, resp. rate 18, height 5\' 4"  (1.626 m), weight 66.5 kg, SpO2 100 %. Pleasant elderly African-American lady not in distress. . Afebrile. Head is nontraumatic. Neck is supple without bruit.    Cardiac exam no murmur or gallop. Lungs are clear to auscultation. Distal pulses are well felt. Neurological Exam ;  Awake  Alert oriented x 2. Normal speech and language.diminished attention, recall.  Follows commands well.  Eye movements full without nystagmus.fundi were not visualized.  She is blind in the right eye which shows corneal opacity.  Vision appears adequate in the left eye.  Hearing is diminished bilaterally.. Palatal movements are normal. Face symmetric. Tongue midline. Normal strength, tone, reflexes and coordination. Normal sensation. Gait deferred.  NIH stroke scale 0.  Baseline modified Rankin score 1    ASSESSMENT/PLAN Ms. JOLLY CARLINI is a 84 y.o. female with history of hypothyroidism, hypertension, hyperlipidemia, glaucoma, and diabetes recently in the hospital secondary to confusion in the setting of hypertension urgency with normal MRI presenting with AMS and agitation. She did not receive IV t-PA due to improving deficits.  Small subcortical left brain stroke vs TIA: consider repeating MRI  Resultant deficits resolved  Code Stroke CT Head - not ordered  CT head -  No acute intracranial findings. Chronic microvascular ischemic change  and cerebral volume loss.   MRI head - 07/19/19 - No acute abnormality.  Negative for PRES. Atrophy and moderate chronic microvascular ischemic changes.  MRA head - not ordered  CTA H&N - Mild atherosclerotic disease in the carotid bifurcation bilaterally without significant stenosis. Moderate stenosis origin of right vertebral artery which is then patent to the basilar. No significant left vertebral artery stenosis Negative for intracranial large vessel occlusion or flow limiting stenosis.   CT Perfusion - not ordered  Carotid Doppler - 07/20/19 - 1-39% stenosis bilaterally  2D Echo - 07/20/19 - EF 60 - 65%. No cardiac source of emboli identified.   Sars Corona Virus 2 - negative  LDL - 125  HgbA1c - 6.4  UDS - negative  VTE prophylaxis - Lovenox Diet  Diet Order            Diet Carb Modified Fluid consistency: Thin; Room service appropriate? Yes  Diet effective now                 clopidogrel 75 mg daily prior to admission, now on clopidogrel 75 mg daily  Patient counseled to be compliant with her antithrombotic medications  Ongoing aggressive stroke risk factor management  Therapy recommendations:  pending  Disposition:  Pending  Hypertension  Home BP meds: Norvasc ; Coreg  Current BP meds: none   Stable . Permissive hypertension (OK if < 220/120) but gradually normalize in 5-7 days  . Long-term BP goal normotensive  Hyperlipidemia  Home Lipid lowering medication: Lipitor 40 mg daily   LDL 125, goal < 70  Current lipid lowering medication: Lipitor 40 mg daily   Continue statin at discharge  Diabetes  Home diabetic meds: metformin  Current diabetic meds: SSI   HgbA1c 6.4, goal < 7.0 Recent Labs    07/21/19 1616 07/21/19 2112 07/22/19 0808  GLUCAP 113* 129* 114*    Other Stroke Risk Factors  Advanced age  ETOH use, advised to drink no more than 1 alcoholic beverage per day.  Other Active Problems  Code status - Full  code  Bradycardia - 40's at times  Mild anemia - Hgb - 11.4   Patient presented with transient right upper extremity numbness and weakness in the setting of transient confusion likely left hemispheric TIA from small vessel disease.  Check MRI scans of brain and no need to repeat rest of stroke work-up as it was done 2 days ago.  Check EEG.  Patient may possibly consider participation in the BMS stroke study if interested.  She was given information to review and decide.  Greater than 50% time during this 35-minute visit was spent on counseling and coordination of care about TIA and stroke and answering questions. Discussed with patient, daughter and Dr. Majel Homer day # 1  Delia Heady, MD  To contact Stroke Continuity provider, please refer to [email or web address]. After hours, contact General Neurology

## 2019-07-22 NOTE — Progress Notes (Signed)
PT Cancellation Note  Patient Details Name: [name] MRN: [id number] DOB: 01-23-1936   Cancelled Treatment:    Reason Eval/Treat Not Completed: Patient at procedure or test/unavailable Pt currently getting EEG. Will follow up as schedule allows.   Cindee Salt, DPT  Acute Rehabilitation Services  Pager: [phone] Office: [phone]    Lehman Prom 07/22/2019, 11:31 AM

## 2019-07-22 NOTE — Evaluation (Signed)
Physical Therapy Evaluation Patient Details Name: Priscilla Tran MRN: [id number] DOB: 10/30/1935 Today's Date: 07/22/2019   History of Present Illness  Pt is an 84 y/o female admitted secondary to AMS. CT negative for acute abnormality. PMH includes TIA, DM, HTN, glaucoma, and R eye blindness.   Clinical Impression  Pt admitted secondary to problem above with deficits below. Pt requiring min to min guard A for mobility using HHA. Pt tolerated gait well to bathroom and back within ED room. Educated about using walker at home to increase stability. Pt has support from her daughter at home. Recommending HHPT to address current deficits. Will continue to follow acutely to maximize functional mobility independence and safety.     Follow Up Recommendations Home health PT;Supervision for mobility/OOB    Equipment Recommendations  None recommended by PT    Recommendations for Other Services       Precautions / Restrictions Precautions Precautions: Fall Restrictions Weight Bearing Restrictions: No      Mobility  Bed Mobility Overal bed mobility: Needs Assistance Bed Mobility: Supine to Sit;Sit to Supine     Supine to sit: Supervision Sit to supine: Supervision   General bed mobility comments: Supervision for safety.   Transfers Overall transfer level: Needs assistance Equipment used: 1 person hand held assist Transfers: Sit to/from Stand Sit to Stand: Min guard         General transfer comment: Min guard for safety to stand from higher stretcher height.   Ambulation/Gait Ambulation/Gait assistance: Min guard;Min assist Gait Distance (Feet): 30 Feet Assistive device: 1 person hand held assist Gait Pattern/deviations: Step-through pattern;Decreased stride length Gait velocity: decreased   General Gait Details: Slow, mildly unsteady gait with use of HHA. Min to min guard for safety. Educated about using Rollator for increased safety with mobility.   Stairs             Wheelchair Mobility    Modified Rankin (Stroke Patients Only)       Balance Overall balance assessment: Needs assistance Sitting-balance support: Feet supported Sitting balance-Leahy Scale: Good     Standing balance support: Single extremity supported Standing balance-Leahy Scale: Poor Standing balance comment: Reliant on UE and external support                              Pertinent Vitals/Pain Pain Assessment: No/denies pain    Home Living Family/patient expects to be discharged to:: Private residence Living Arrangements: Children Available Help at Discharge: Family;Available 24 hours/day Type of Home: House Home Access: Stairs to enter Entrance Stairs-Rails: None Entrance Stairs-Number of Steps: 1 (threshold) Home Layout: One level Home Equipment: Cane - single point;Shower seat;Grab bars - tub/shower;Bedside commode;Walker - 4 wheels      Prior Function Level of Independence: Independent with assistive device(s)         Comments: Was using rollator for ambulation      Hand Dominance        Extremity/Trunk Assessment   Upper Extremity Assessment Upper Extremity Assessment: Defer to OT evaluation    Lower Extremity Assessment Lower Extremity Assessment: Generalized weakness    Cervical / Trunk Assessment Cervical / Trunk Assessment: Normal  Communication   Communication: No difficulties  Cognition Arousal/Alertness: Awake/alert Behavior During Therapy: WFL for tasks assessed/performed Overall Cognitive Status: Within Functional Limits for tasks assessed  General Comments      Exercises     Assessment/Plan    PT Assessment Patient needs continued PT services  PT Problem List Decreased mobility;Decreased balance;Decreased knowledge of use of DME;Decreased knowledge of precautions;Decreased strength       PT Treatment Interventions DME instruction;Gait training;Stair  training;Functional mobility training;Therapeutic activities;Therapeutic exercise;Balance training;Neuromuscular re-education;Patient/family education    PT Goals (Current goals can be found in the Care Plan section)  Acute Rehab PT Goals Patient Stated Goal: to go home PT Goal Formulation: With patient/family Time For Goal Achievement: 08/05/19 Potential to Achieve Goals: Good    Frequency Min 3X/week   Barriers to discharge        Co-evaluation               AM-PAC PT "6 Clicks" Mobility  Outcome Measure Help needed turning from your back to your side while in a flat bed without using bedrails?: None Help needed moving from lying on your back to sitting on the side of a flat bed without using bedrails?: None Help needed moving to and from a bed to a chair (including a wheelchair)?: A Little Help needed standing up from a chair using your arms (e.g., wheelchair or bedside chair)?: A Little Help needed to walk in hospital room?: A Little Help needed climbing 3-5 steps with a railing? : A Lot 6 Click Score: 19    End of Session Equipment Utilized During Treatment: Gait belt Activity Tolerance: Patient tolerated treatment well Patient left: in bed;with call bell/phone within reach;with family/visitor present Nurse Communication: Mobility status PT Visit Diagnosis: Muscle weakness (generalized) (M62.81);Unsteadiness on feet (R26.81)    Time: 1208-1229 PT Time Calculation (min) (ACUTE ONLY): 21 min   Charges:   PT Evaluation $PT Eval Moderate Complexity: 1 Mod          Farley Ly, PT, DPT  Acute Rehabilitation Services  Pager: [phone] Office: [phone]   Lehman Prom 07/22/2019, 5:49 PM

## 2019-07-22 NOTE — Progress Notes (Signed)
PROGRESS NOTE    Priscilla Tran  OVF:[id number] DOB: [birth year] DOA: 07/21/2019 PCP: Verlon Au, MD  Brief Narrative: Priscilla Tran is a 84 y.o. female with medical history significant of hypertension, type 2 diabetes mellitus, hyperlipidemia, TIA, hypothyroidism, glaucoma with legally blind on right side presented to emergency department yesterday with altered mental status. -Patient reported feeling well yesterday morning and had breakfast and then started to feel nauseous, off-balance and a right-sided weakness/numbness which lasted for 15 minutes then its resolved.  she called her PCP who recommended to check her blood pressure or go to ED.  Patient called her cousin for blood pressure check as she was not able to hold anything in her hand. She was noted to be very confused by her cousin, agitated, delusional, make no sense, could not recognize anyone.  EMS was called-and brought patient to emergency department for further evaluation and management.   Patient recently hospitalized secondary to confusion in the setting of hypertension urgency.  Her MRI was negative.  She discharged 2 days ago in stable condition.  ED Course: Upon arrival to ED: Patient's blood pressure was 173/93, afebrile with no leukocytosis, UA, PT/INR, ammonia, UA ethanol level, troponin, CT head all came back within normal limits.  CMP shows sodium of 133.  Her symptoms slowly improved however her daughter thinks that patient is still not at baseline   Assessment & Plan:   Suspected TIA -Transient confusion and right arm weakness yesterday -She is on Plavix 75 mg at baseline which was continued -Completed stroke work-up last admission, 2D echo with preserved EF -MRI brain without acute findings, atrophy and moderate small vessel disease noted, CTA head and neck 2 days ago without significant large vessel occlusion -LDL is 125 and hemoglobin A4Z 6.4 -Neurology consulted and following, patient and daughter to  decide regarding clinical trial -PT OT eval  Type 2 diabetes mellitus -CBGs are stable, continue sliding scale insulin for now  Hypertension -Restart Coreg, amlodipine on hold  Hypothyroidism -Continue Synthroid  DVT prophylaxis: Lovenox Code Status: Full code Family Communication: Daughter at bedside Disposition Plan:  Status is: Inpatient  Dispo: The patient is from: Home              Anticipated d/c is to: Home              Anticipated d/c date is: Today or tomorrow               Patient currently is not medically stable to d/c.  Pending PT OT eval and further decision regarding clinical trial   Consultants:  Neurology Dr. Pearlean Brownie  Procedures:   Antimicrobials:    Subjective: -Feels okay, back to baseline, no further confusion or weakness/numbness  Objective: Vitals:   07/22/19 1100 07/22/19 1200 07/22/19 1301 07/22/19 1346  BP: 128/64 131/63 125/73 131/75  Pulse: (!) 57 (!) 55 70 71  Resp: 14 18 16 14   Temp:    98.7 F (37.1 C)  TempSrc:    Oral  SpO2: 99% 99% 98% 100%  Weight:      Height:       No intake or output data in the 24 hours ending 07/22/19 1433 Filed Weights   07/21/19 1601  Weight: 66.5 kg    Examination:  General exam: AAOx3, no distress Respiratory system: Clear to auscultation. Cardiovascular system: S1 & S2 heard, RRR.  Gastrointestinal system: Abdomen is nondistended, soft and nontender.Normal bowel sounds heard. Central nervous system: Alert and oriented. No  focal neurological deficits. Extremities: No edema Skin: No rashes on exposed skin Psychiatry: Mood & affect appropriate.     Data Reviewed:   CBC: Recent Labs  Lab 07/19/19 1311 07/19/19 1422 07/21/19 1533 07/21/19 1944 07/22/19 0228  WBC  --  7.6 10.0 8.3 6.9  NEUTROABS  --  4.6 7.0  --  4.0  HGB 13.3 12.1 12.5 11.8* 11.4*  HCT 39.0 39.2 38.9 36.6 35.3*  MCV  --  86.0 82.9 83.4 82.3  PLT  --  214 234 230 211   Basic Metabolic Panel: Recent Labs  Lab  07/19/19 1311 07/19/19 1422 07/21/19 1533 07/21/19 1944 07/22/19 0228  NA 137 137 133*  --  136  K 4.2 4.3 4.3  --  3.5  CL 100 102 98  --  100  CO2  --  23 22  --  25  GLUCOSE 108* 111* 132*  --  135*  BUN 12 11 13   --  9  CREATININE 0.70 0.74 0.72 0.69 0.58  CALCIUM  --  9.7 9.5  --  9.3   GFR: Estimated Creatinine Clearance: 51.7 mL/min (by C-G formula based on SCr of 0.58 mg/dL). Liver Function Tests: Recent Labs  Lab 07/19/19 1422 07/21/19 1533  AST 21 23  ALT 11 14  ALKPHOS 65 62  BILITOT 1.0 1.0  PROT 6.9 7.3  ALBUMIN 4.2 4.3   No results for input(s): LIPASE, AMYLASE in the last 168 hours. Recent Labs  Lab 07/21/19 1548  AMMONIA 19   Coagulation Profile: Recent Labs  Lab 07/19/19 1422 07/21/19 1533  INR 1.0 0.9   Cardiac Enzymes: No results for input(s): CKTOTAL, CKMB, CKMBINDEX, TROPONINI in the last 168 hours. BNP (last 3 results) No results for input(s): PROBNP in the last 8760 hours. HbA1C: Recent Labs    07/20/19 0449 07/22/19 0228  HGBA1C 6.4* 6.4*   CBG: Recent Labs  Lab 07/20/19 1113 07/21/19 1616 07/21/19 2112 07/22/19 0808 07/22/19 1145  GLUCAP 124* 113* 129* 114* 120*   Lipid Profile: Recent Labs    07/20/19 0449 07/21/19 1944  CHOL 254* 217*  HDL 84 78  LDLCALC 145* 125*  TRIG 124 71  CHOLHDL 3.0 2.8   Thyroid Function Tests: Recent Labs    07/21/19 1944  TSH 3.671   Anemia Panel: Recent Labs    07/21/19 1944  VITAMINB12 647  FOLATE 20.8   Urine analysis:    Component Value Date/Time   COLORURINE COLORLESS (A) 07/21/2019 1534   APPEARANCEUR CLEAR 07/21/2019 1534   LABSPEC 1.003 (L) 07/21/2019 1534   PHURINE 6.0 07/21/2019 1534   GLUCOSEU NEGATIVE 07/21/2019 1534   HGBUR NEGATIVE 07/21/2019 1534   BILIRUBINUR NEGATIVE 07/21/2019 1534   BILIRUBINUR neg 03/30/2011 1924   KETONESUR NEGATIVE 07/21/2019 1534   PROTEINUR NEGATIVE 07/21/2019 1534   UROBILINOGEN 0.2 03/31/2011 0807   NITRITE NEGATIVE  07/21/2019 1534   LEUKOCYTESUR NEGATIVE 07/21/2019 1534   Sepsis Labs: @LABRCNTIP (procalcitonin:4,lacticidven:4)  ) Recent Results (from the past 240 hour(s))  SARS Coronavirus 2 by RT PCR (hospital order, performed in [facility] hospital lab) Nasopharyngeal Nasopharyngeal Swab     Status: None   Collection Time: 07/19/19 11:16 PM   Specimen: Nasopharyngeal Swab  Result Value Ref Range Status   SARS Coronavirus 2 NEGATIVE NEGATIVE Final    Comment: (NOTE) SARS-CoV-2 target nucleic acids are NOT DETECTED.  The SARS-CoV-2 RNA is generally detectable in upper and lower respiratory specimens during the acute phase of infection. The lowest concentration of  SARS-CoV-2 viral copies this assay can detect is 250 copies / mL. A negative result does not preclude SARS-CoV-2 infection and should not be used as the sole basis for treatment or other patient management decisions.  A negative result may occur with improper specimen collection / handling, submission of specimen other than nasopharyngeal swab, presence of viral mutation(s) within the areas targeted by this assay, and inadequate number of viral copies (<250 copies / mL). A negative result must be combined with clinical observations, patient history, and epidemiological information.  Fact Sheet for Patients:   BoilerBrush.com.cy  Fact Sheet for Healthcare Providers: https://pope.com/  This test is not yet approved or  cleared by the [place] FDA and has been authorized for detection and/or diagnosis of SARS-CoV-2 by FDA under an Emergency Use Authorization (EUA).  This EUA will remain in effect (meaning this test can be used) for the duration of the COVID-19 declaration under Section 564(b)(1) of the Act, 21 U.S.C. section 360bbb-3(b)(1), unless the authorization is terminated or revoked sooner.  Performed at [facility], [address], [place], [place] [address]     Urine culture     Status: Abnormal   Collection Time: 07/21/19  3:34 PM   Specimen: Urine, Clean Catch  Result Value Ref Range Status   Specimen Description URINE, CLEAN CATCH  Final   Special Requests NONE  Final   Culture (A)  Final    <10,000 COLONIES/mL INSIGNIFICANT GROWTH Performed at [facility], [address] [place], [place] [address]    Report Status 07/22/2019 FINAL  Final  SARS Coronavirus 2 by RT PCR (hospital order, performed in [facility] hospital lab) Nasopharyngeal Nasopharyngeal Swab     Status: None   Collection Time: 07/21/19  7:40 PM   Specimen: Nasopharyngeal Swab  Result Value Ref Range Status   SARS Coronavirus 2 NEGATIVE NEGATIVE Final    Comment: (NOTE) SARS-CoV-2 target nucleic acids are NOT DETECTED.  The SARS-CoV-2 RNA is generally detectable in upper and lower respiratory specimens during the acute phase of infection. The lowest concentration of SARS-CoV-2 viral copies this assay can detect is 250 copies / mL. A negative result does not preclude SARS-CoV-2 infection and should not be used as the sole basis for treatment or other patient management decisions.  A negative result may occur with improper specimen collection / handling, submission of specimen other than nasopharyngeal swab, presence of viral mutation(s) within the areas targeted by this assay, and inadequate number of viral copies (<250 copies / mL). A negative result must be combined with clinical observations, patient history, and epidemiological information.  Fact Sheet for Patients:   BoilerBrush.com.cy  Fact Sheet for Healthcare Providers: https://pope.com/  This test is not yet approved or  cleared by the [place] FDA and has been authorized for detection and/or diagnosis of SARS-CoV-2 by FDA under an Emergency Use Authorization (EUA).  This EUA will remain in effect (meaning this test can be used) for the duration  of the COVID-19 declaration under Section 564(b)(1) of the Act, 21 U.S.C. section 360bbb-3(b)(1), unless the authorization is terminated or revoked sooner.  Performed at [facility], [address], [place], [place] [address]          Radiology Studies: CT Head Wo Contrast  Result Date: 07/21/2019 CLINICAL DATA:  Cephalopathy EXAM: CT HEAD WITHOUT CONTRAST TECHNIQUE: Contiguous axial images were obtained from the base of the skull through the vertex without intravenous contrast. COMPARISON:  07/19/2019 FINDINGS:  Brain: No evidence of acute infarction, hemorrhage, hydrocephalus, extra-axial collection or mass lesion/mass effect. Mild-moderate low-density changes within the periventricular and subcortical white matter compatible with chronic microvascular ischemic change. Mild diffuse cerebral volume loss. Vascular: Atherosclerotic calcifications involving the large vessels of the skull base. No unexpected hyperdense vessel. Skull: Normal. Negative for fracture or focal lesion. Sinuses/Orbits: No acute findings Other: None. IMPRESSION: 1. No acute intracranial findings. 2. Chronic microvascular ischemic change and cerebral volume loss. Electronically Signed   By: Davina Poke D.O.   On: 07/21/2019 16:14   CT ANGIO NECK W OR WO CONTRAST  Result Date: 07/21/2019 CLINICAL DATA:  Altered mental status.  Rule out stroke. EXAM: CT ANGIOGRAPHY HEAD AND NECK TECHNIQUE: Multidetector CT imaging of the head and neck was performed using the standard protocol during bolus administration of intravenous contrast. Multiplanar CT image reconstructions and MIPs were obtained to evaluate the vascular anatomy. Carotid stenosis measurements (when applicable) are obtained utilizing NASCET criteria, using the distal internal carotid diameter as the denominator. CONTRAST:  112mL OMNIPAQUE IOHEXOL 350 MG/ML SOLN COMPARISON:  CT head 07/21/2019.  MRI head 07/19/2019 FINDINGS: CTA NECK FINDINGS Aortic arch:  Standard branching. Imaged portion shows no evidence of aneurysm or dissection. No significant stenosis of the major arch vessel origins. Right carotid system: Mild atherosclerotic disease in the right carotid bifurcation without significant stenosis or irregularity Left carotid system: Mild atherosclerotic disease left carotid bifurcation. Calcification left carotid bulb. Less than 25% diameter stenosis proximal left internal carotid artery. No irregularity. Vertebral arteries: Both vertebral arteries are patent to the basilar. Moderate calcific stenosis at the origin of the right vertebral artery. No left vertebral artery stenosis. Skeleton: ACDF C3 through C7 with solid fusion. No acute skeletal abnormality. Extensive degenerative change at C1-2 extending to the left. Other neck: Negative for mass or adenopathy. Upper chest: Lung apices clear bilaterally. Review of the MIP images confirms the above findings CTA HEAD FINDINGS Anterior circulation: Mild atherosclerotic disease in the cavernous carotid bilaterally without significant stenosis. Anterior and middle cerebral arteries patent bilaterally without stenosis or large vessel occlusion. Posterior circulation: Both vertebral arteries patent to the basilar. PICA patent bilaterally. Basilar widely patent. Basilar ends in the superior cerebellar arteries bilaterally which are patent. Fetal origin of the posterior cerebral artery bilaterally without significant stenosis. Venous sinuses: Normal venous enhancement. Anatomic variants: None Review of the MIP images confirms the above findings IMPRESSION: Mild atherosclerotic disease in the carotid bifurcation bilaterally without significant stenosis Moderate stenosis origin of right vertebral artery which is then patent to the basilar. No significant left vertebral artery stenosis Negative for intracranial large vessel occlusion or flow limiting stenosis. Electronically Signed   By: Franchot Gallo M.D.   On: 07/21/2019  19:29   EEG adult  Result Date: 07/22/2019 Lora Havens, MD     07/22/2019 12:19 PM Patient Name: VEGAS COFFIN MRN: [id number] Epilepsy Attending: Lora Havens Referring Physician/Provider: Mikey Bussing, PA Date: 07/22/2019 Duration: 23.26 minutes Patient history: 84 year old female with an episode of right arm numbness and dropping things followed by blurry vision in the left eye (blind in right eye) and episode of confusion.  EEG evaluate for seizures. Level of alertness: Awake, drowsy AEDs during EEG study: None Technical aspects: This EEG study was done with scalp electrodes positioned according to the 10-20 International system of electrode placement. Electrical activity was acquired at a sampling rate of 500Hz  and reviewed with a high frequency filter of 70Hz  and a low frequency filter of  1Hz . EEG data were recorded continuously and digitally stored. Description: The posterior dominant rhythm consists of 8 Hz activity of moderate voltage (25-35 uV) seen predominantly in posterior head regions, symmetric and reactive to eye opening and eye closing. Drowsiness was characterized by attenuation of the posterior background rhythm.  Single sharp transient was noticed in left frontotemporal region, maximal F7. Hyperventilation and photic stimulation were not performed.   IMPRESSION: This study is within normal limits. No seizures or epileptiform discharges were seen throughout the recording. However, only wakefulness and drowsiness were recorded. If suspicion for interictal activity remains a concern, a prolonged study including sleep can be considered. Priyanka Annabelle Harman Yadav   CT ANGIO HEAD CODE STROKE  Result Date: 07/21/2019 CLINICAL DATA:  Altered mental status.  Rule out stroke. EXAM: CT ANGIOGRAPHY HEAD AND NECK TECHNIQUE: Multidetector CT imaging of the head and neck was performed using the standard protocol during bolus administration of intravenous contrast. Multiplanar CT image reconstructions  and MIPs were obtained to evaluate the vascular anatomy. Carotid stenosis measurements (when applicable) are obtained utilizing NASCET criteria, using the distal internal carotid diameter as the denominator. CONTRAST:  100mL OMNIPAQUE IOHEXOL 350 MG/ML SOLN COMPARISON:  CT head 07/21/2019.  MRI head 07/19/2019 FINDINGS: CTA NECK FINDINGS Aortic arch: Standard branching. Imaged portion shows no evidence of aneurysm or dissection. No significant stenosis of the major arch vessel origins. Right carotid system: Mild atherosclerotic disease in the right carotid bifurcation without significant stenosis or irregularity Left carotid system: Mild atherosclerotic disease left carotid bifurcation. Calcification left carotid bulb. Less than 25% diameter stenosis proximal left internal carotid artery. No irregularity. Vertebral arteries: Both vertebral arteries are patent to the basilar. Moderate calcific stenosis at the origin of the right vertebral artery. No left vertebral artery stenosis. Skeleton: ACDF C3 through C7 with solid fusion. No acute skeletal abnormality. Extensive degenerative change at C1-2 extending to the left. Other neck: Negative for mass or adenopathy. Upper chest: Lung apices clear bilaterally. Review of the MIP images confirms the above findings CTA HEAD FINDINGS Anterior circulation: Mild atherosclerotic disease in the cavernous carotid bilaterally without significant stenosis. Anterior and middle cerebral arteries patent bilaterally without stenosis or large vessel occlusion. Posterior circulation: Both vertebral arteries patent to the basilar. PICA patent bilaterally. Basilar widely patent. Basilar ends in the superior cerebellar arteries bilaterally which are patent. Fetal origin of the posterior cerebral artery bilaterally without significant stenosis. Venous sinuses: Normal venous enhancement. Anatomic variants: None Review of the MIP images confirms the above findings IMPRESSION: Mild  atherosclerotic disease in the carotid bifurcation bilaterally without significant stenosis Moderate stenosis origin of right vertebral artery which is then patent to the basilar. No significant left vertebral artery stenosis Negative for intracranial large vessel occlusion or flow limiting stenosis. Electronically Signed   By: Marlan Palauharles  Clark M.D.   On: 07/21/2019 19:29        Scheduled Meds:   stroke: mapping our early stages of recovery book   Does not apply Once   atorvastatin  40 mg Oral Daily   clopidogrel  75 mg Oral Daily   enoxaparin (LOVENOX) injection  40 mg Subcutaneous Q24H   insulin aspart  0-15 Units Subcutaneous TID WC   insulin aspart  0-5 Units Subcutaneous QHS   levothyroxine  150 mcg Oral QAC breakfast   Continuous Infusions:   LOS: 1 day    Time spent: 25min  Zannie CovePreetha Treacy Holcomb, MD [facility]  07/22/2019, 2:33 PM

## 2019-07-23 ENCOUNTER — Other Ambulatory Visit: Payer: Self-pay | Admitting: Neurology

## 2019-07-23 ENCOUNTER — Inpatient Hospital Stay (HOSPITAL_COMMUNITY): Payer: Medicare HMO

## 2019-07-23 DIAGNOSIS — H548 Legal blindness, as defined in USA: Secondary | ICD-10-CM

## 2019-07-23 DIAGNOSIS — G459 Transient cerebral ischemic attack, unspecified: Principal | ICD-10-CM

## 2019-07-23 DIAGNOSIS — E7800 Pure hypercholesterolemia, unspecified: Secondary | ICD-10-CM

## 2019-07-23 DIAGNOSIS — R410 Disorientation, unspecified: Secondary | ICD-10-CM

## 2019-07-23 DIAGNOSIS — G9341 Metabolic encephalopathy: Secondary | ICD-10-CM

## 2019-07-23 DIAGNOSIS — R441 Visual hallucinations: Secondary | ICD-10-CM

## 2019-07-23 LAB — URINE DRUGS OF ABUSE SCREEN W ALC, ROUTINE (REF LAB)
Amphetamines, Urine: NEGATIVE ng/mL
Barbiturate, Ur: NEGATIVE ng/mL
Benzodiazepine Quant, Ur: NEGATIVE ng/mL
Cannabinoid Quant, Ur: NEGATIVE ng/mL
Cocaine (Metab.): NEGATIVE ng/mL
Ethanol U, Quan: NEGATIVE %
Methadone Screen, Urine: NEGATIVE ng/mL
Opiate Quant, Ur: NEGATIVE ng/mL
Phencyclidine, Ur: NEGATIVE ng/mL
Propoxyphene, Urine: NEGATIVE ng/mL

## 2019-07-23 LAB — GLUCOSE, CAPILLARY
Glucose-Capillary: 106 mg/dL — ABNORMAL HIGH (ref 70–99)
Glucose-Capillary: 162 mg/dL — ABNORMAL HIGH (ref 70–99)

## 2019-07-23 MED ORDER — ASPIRIN EC 81 MG PO TBEC
81.0000 mg | DELAYED_RELEASE_TABLET | Freq: Every day | ORAL | Status: DC
  Administered 2019-07-23: 81 mg via ORAL
  Filled 2019-07-23: qty 1

## 2019-07-23 MED ORDER — ASPIRIN 81 MG PO TBEC: 81.0000 mg | DELAYED_RELEASE_TABLET | Freq: Every day | ORAL | Status: AC

## 2019-07-23 NOTE — Evaluation (Signed)
Occupational Therapy Evaluation Patient Details Name: Priscilla Tran MRN: [id number] DOB: 01-31-1935 Today's Date: 07/23/2019    History of Present Illness Pt is an 84 y/o female admitted secondary to AMS. CT negative for acute abnormality. PMH includes TIA, DM, HTN, glaucoma, and R eye blindness.    Clinical Impression   Patient evaluated by Occupational Therapy with no further acute OT needs identified. All education has been completed and the patient has no further questions. All education completed.  Pt able to perform ADLs at supervision level, and daughters are very supportive and able to provide appropriate level of assistance.  See below for any follow-up Occupational Therapy or equipment needs. OT is signing off. Thank you for this referral.      Follow Up Recommendations  No OT follow up;Supervision - Intermittent    Equipment Recommendations  None recommended by OT    Recommendations for Other Services       Precautions / Restrictions Precautions Precautions: Fall Precaution Comments: x1 fall in the past 6 months      Mobility Bed Mobility               General bed mobility comments: sitting up in chair   Transfers Overall transfer level: Needs assistance Equipment used: Rolling walker (2 wheeled) Transfers: Sit to/from [facility] Sit to Stand: Supervision Stand pivot transfers: Supervision            Balance Overall balance assessment: Needs assistance Sitting-balance support: Feet supported Sitting balance-Leahy Scale: Good     Standing balance support: No upper extremity supported;During functional activity Standing balance-Leahy Scale: Fair                             ADL either performed or assessed with clinical judgement   ADL Overall ADL's : Needs assistance/impaired Eating/Feeding: Modified independent   Grooming: Wash/dry face;Wash/dry hands;Oral care;Brushing hair;Supervision/safety;Standing   Upper  Body Bathing: Set up;Supervision/ safety;Sitting   Lower Body Bathing: Supervison/ safety;Sit to/from stand   Upper Body Dressing : Set up;Supervision/safety;Sitting   Lower Body Dressing: Supervision/safety;Sit to/from stand   Toilet Transfer: Supervision/safety;Ambulation;Comfort height toilet   Toileting- Clothing Manipulation and Hygiene: Supervision/safety;Sit to/from stand       Functional mobility during ADLs: Supervision/safety General ADL Comments: supervision for safety      Vision Baseline Vision/History: Glaucoma Patient Visual Report: No change from baseline Additional Comments: Pt with h/o blindness Rt eye.  Pt reports vision is at baseline      Perception Perception Perception Tested?: Yes   Praxis Praxis Praxis tested?: Within functional limits    Pertinent Vitals/Pain Pain Assessment: No/denies pain     Hand Dominance Right   Extremity/Trunk Assessment Upper Extremity Assessment Upper Extremity Assessment: Overall WFL for tasks assessed   Lower Extremity Assessment Lower Extremity Assessment: Generalized weakness   Cervical / Trunk Assessment Cervical / Trunk Assessment: Kyphotic   Communication Communication Communication: No difficulties (daughter reports speech slower )   Cognition Arousal/Alertness: Awake/alert Behavior During Therapy: WFL for tasks assessed/performed Overall Cognitive Status: Impaired/Different from baseline                                 General Comments: Family reports mild/subtle changes in cognition - speech at times a bit slurred, she requires occasional min A for problem solving    General Comments  daughters present.  They and/or family friends  will be able to provide 24 hours supervision initially.  Discussed importance of return to routine and structure as well as normal sleep patterns to optimize her cognition     Exercises     Shoulder Instructions      Home Living Family/patient expects to  be discharged to:: Private residence Living Arrangements: Children Available Help at Discharge: Family;Available 24 hours/day Type of Home: House Home Access: Stairs to enter [employer] of Steps: 1 Entrance Stairs-Rails: None Home Layout: One level     Bathroom Shower/Tub: Chief Strategy Officer: Handicapped height Bathroom Accessibility: Yes   Home Equipment: Cane - single point;Shower seat;Grab bars - tub/shower;Bedside commode;Walker - 4 wheels          Prior Functioning/Environment Level of Independence: Independent with assistive device(s)        Comments: Was using rollator for ambulation         OT Problem List: Decreased activity tolerance;Impaired balance (sitting and/or standing);Decreased knowledge of use of DME or AE;Impaired vision/perception      OT Treatment/Interventions:      OT Goals(Current goals can be found in the care plan section) Acute Rehab OT Goals Patient Stated Goal: to go home and take a nap  OT Goal Formulation: With patient/family  OT Frequency:     Barriers to D/C:            Co-evaluation              AM-PAC OT "6 Clicks" Daily Activity     Outcome Measure Help from another person eating meals?: None Help from another person taking care of personal grooming?: A Little Help from another person toileting, which includes using toliet, bedpan, or urinal?: A Little Help from another person bathing (including washing, rinsing, drying)?: A Little Help from another person to put on and taking off regular upper body clothing?: A Little Help from another person to put on and taking off regular lower body clothing?: A Little 6 Click Score: 19   End of Session Equipment Utilized During Treatment: Rolling walker Nurse Communication: Mobility status  Activity Tolerance: Patient tolerated treatment well Patient left: in chair;with call bell/phone within reach;with family/visitor present;with nursing/sitter in  room  OT Visit Diagnosis: Cognitive communication deficit (R41.841);Low vision, both eyes (H54.2)                Time: 1328-1350 OT Time Calculation (min): 22 min Charges:  OT General Charges $OT Visit: 1 Visit OT Evaluation $OT Eval Moderate Complexity: 1 Mod  Eber Jones., OTR/L Acute Rehabilitation Services Pager [phone] Office [phone]   Jeani Hawking M 07/23/2019, 3:53 PM

## 2019-07-23 NOTE — Discharge Summary (Signed)
Physician Discharge Summary  Priscilla Tran XLK:[id number] DOB: 06-23-1935 DOA: 07/21/2019  PCP: Verlon Au, MD  Admit date: 07/21/2019 Discharge date: 07/23/2019  Time spent: 45 minutes  Recommendations for Outpatient Follow-up:  1. [facility] Dr. Pearlean Brownie in 1 month   Discharge Diagnoses:  Suspected TIA Cognitive deficits Legally blind   Hypothyroid   Diabetes type 2, controlled (HCC)   TIA (transient ischemic attack)   Hyperlipidemia   Discharge Condition: Stable  Diet recommendation: Regular  Filed Weights   07/21/19 1601  Weight: 66.5 kg    History of present illness:   Priscilla Tran a 84 y.o.femalewith medical history significant ofhypertension, type 2 diabetes mellitus, hyperlipidemia, TIA, hypothyroidism, glaucoma with legally blind on right side presented to emergency department yesterday with altered mental status. -Patient reported feeling well yesterday morning and had breakfast and then started to feel nauseous, off-balance and a right-sided weakness/numbness which lasted for 15 minutes then its resolved. she called her PCP who recommended to check her blood pressure or go to ED.Patient called her cousin for blood pressure check as she was not able to hold anything in her hand. She was noted to be very confused by her cousin,agitated,delusional, make no sense,could not recognize anyone. EMS was called-and brought patient to emergency department for further evaluation and management.  Patient recently hospitalized secondary to confusion in the setting of hypertension urgency. Her MRI was negative. She discharged 2 days ago in stable condition. ED Course:Upon arrival to ED: Patient's blood pressure was 173/93, afebrile with no leukocytosis, UA, PT/INR, ammonia, UA ethanol level, troponin, CT head all came back within normal limits.  Hospital Course:   Suspected TIA -Transient confusion and right arm weakness yesterday -She is on  Plavix 75 mg at baseline which was continued -Completed stroke work-up last admission, 2D echo with preserved EF -MRI brain without acute findings, atrophy and moderate small vessel disease noted, CTA head and neck 2 days ago without significant large vessel occlusion -LDL is 125 and hemoglobin D6U 6.4 -Neurology consulted and following,  -Dr.Xu recommended aspirin and Plavix for 3 weeks followed by Plavix alone -Follow-up with [facility] in 4 to 6 weeks -Patient also has some cognitive deficits and vision problems from being legally blind which could also contribute to some confusion   Type 2 diabetes mellitus -Stable, Metformin resumed  Hypertension -Resumed Coreg and amlodipine  Hypothyroidism -Continue Synthroid   Consultations:  Neurology  Discharge Exam: Vitals:   07/23/19 0738 07/23/19 1223  BP: (!) 144/97 126/67  Pulse: (!) 55 60  Resp: 16 18  Temp: 99 F (37.2 C) 99.4 F (37.4 C)  SpO2: 99% 98%    General: AAOx2, mild cognitive deficits Cardiovascular: S1S2/RRR Respiratory: CTAB  Discharge Instructions   Discharge Instructions    Diet - low sodium heart healthy   Complete by: As directed    Increase activity slowly   Complete by: As directed      Allergies as of 07/23/2019      Reactions   Lisinopril Swelling   FACE THROAT " Breathing not affected per patient."      Medication List    TAKE these medications   acetaminophen 325 MG tablet Commonly known as: TYLENOL Take 2 tablets (650 mg total) by mouth every 4 (four) hours as needed for mild pain (or temp > 37.5 C (99.5 F)).   amLODipine 10 MG tablet Commonly known as: NORVASC Take 1 tablet (10 mg total) by mouth daily.   aspirin 81  MG EC tablet Take 1 tablet (81 mg total) by mouth daily for 21 days. Swallow whole.   atorvastatin 40 MG tablet Commonly known as: LIPITOR Take 1 tablet (40 mg total) by mouth daily.   brimonidine 0.2 % ophthalmic solution Commonly known as:  ALPHAGAN Place 1 drop into the left eye in the morning and at bedtime.   carvedilol 12.5 MG tablet Commonly known as: COREG Take 1 tablet (12.5 mg total) by mouth 2 (two) times daily with a meal.   clopidogrel 75 MG tablet Commonly known as: PLAVIX TAKE 1 TABLET BY MOUTH DAILY (HOLD UNTIL 10/08/17) What changed: See the new instructions.   levothyroxine 150 MCG tablet Commonly known as: SYNTHROID Take 1 tablet (150 mcg total) by mouth daily.   metFORMIN 1000 MG tablet Commonly known as: GLUCOPHAGE Take 1 tablet (1,000 mg total) by mouth 2 (two) times daily with a meal.   prednisoLONE acetate 1 % ophthalmic suspension Commonly known as: PRED FORTE Place 1 drop into the left eye 3 (three) times daily.   VITAMIN B-12 PO Take 1 tablet by mouth daily.      Allergies  Allergen Reactions  . Lisinopril Swelling    FACE THROAT " Breathing not affected per patient."    Follow-up Information    Micki Riley, MD. Schedule an appointment as soon as possible for a visit in 1 month(s).   Specialties: Neurology, Radiology Contact information: [address] [place] [address] [phone]                The results of significant diagnostics from this hospitalization (including imaging, microbiology, ancillary and laboratory) are listed below for reference.    Significant Diagnostic Studies: CT Head Wo Contrast  Result Date: 07/21/2019 CLINICAL DATA:  Cephalopathy EXAM: CT HEAD WITHOUT CONTRAST TECHNIQUE: Contiguous axial images were obtained from the base of the skull through the vertex without intravenous contrast. COMPARISON:  07/19/2019 FINDINGS: Brain: No evidence of acute infarction, hemorrhage, hydrocephalus, extra-axial collection or mass lesion/mass effect. Mild-moderate low-density changes within the periventricular and subcortical white matter compatible with chronic microvascular ischemic change. Mild diffuse cerebral volume loss. Vascular:  Atherosclerotic calcifications involving the large vessels of the skull base. No unexpected hyperdense vessel. Skull: Normal. Negative for fracture or focal lesion. Sinuses/Orbits: No acute findings Other: None. IMPRESSION: 1. No acute intracranial findings. 2. Chronic microvascular ischemic change and cerebral volume loss. Electronically Signed   By: Duanne Guess D.O.   On: 07/21/2019 16:14   CT ANGIO NECK W OR WO CONTRAST  Result Date: 07/21/2019 CLINICAL DATA:  Altered mental status.  Rule out stroke. EXAM: CT ANGIOGRAPHY HEAD AND NECK TECHNIQUE: Multidetector CT imaging of the head and neck was performed using the standard protocol during bolus administration of intravenous contrast. Multiplanar CT image reconstructions and MIPs were obtained to evaluate the vascular anatomy. Carotid stenosis measurements (when applicable) are obtained utilizing NASCET criteria, using the distal internal carotid diameter as the denominator. CONTRAST:  OMNIPAQUE IOHEXOL 350 MG/ML SOLN COMPARISON:  CT head 07/21/2019.  MRI head 07/19/2019 FINDINGS: CTA NECK FINDINGS Aortic arch: Standard branching. Imaged portion shows no evidence of aneurysm or dissection. No significant stenosis of the major arch vessel origins. Right carotid system: Mild atherosclerotic disease in the right carotid bifurcation without significant stenosis or irregularity Left carotid system: Mild atherosclerotic disease left carotid bifurcation. Calcification left carotid bulb. Less than 25% diameter stenosis proximal left internal carotid artery. No irregularity. Vertebral arteries: Both vertebral arteries are  patent to the basilar. Moderate calcific stenosis at the origin of the right vertebral artery. No left vertebral artery stenosis. Skeleton: ACDF C3 through C7 with solid fusion. No acute skeletal abnormality. Extensive degenerative change at C1-2 extending to the left. Other neck: Negative for mass or adenopathy. Upper chest: Lung apices  clear bilaterally. Review of the MIP images confirms the above findings CTA HEAD FINDINGS Anterior circulation: Mild atherosclerotic disease in the cavernous carotid bilaterally without significant stenosis. Anterior and middle cerebral arteries patent bilaterally without stenosis or large vessel occlusion. Posterior circulation: Both vertebral arteries patent to the basilar. PICA patent bilaterally. Basilar widely patent. Basilar ends in the superior cerebellar arteries bilaterally which are patent. Fetal origin of the posterior cerebral artery bilaterally without significant stenosis. Venous sinuses: Normal venous enhancement. Anatomic variants: None Review of the MIP images confirms the above findings IMPRESSION: Mild atherosclerotic disease in the carotid bifurcation bilaterally without significant stenosis Moderate stenosis origin of right vertebral artery which is then patent to the basilar. No significant left vertebral artery stenosis Negative for intracranial large vessel occlusion or flow limiting stenosis. Electronically Signed   By: Marlan Palau M.D.   On: 07/21/2019 19:29   MR BRAIN WO CONTRAST  Result Date: 07/19/2019 CLINICAL DATA:  Hypertension. Rule out PRES. Patient with confusion. EXAM: MRI HEAD WITHOUT CONTRAST TECHNIQUE: Multiplanar, multiecho pulse sequences of the brain and surrounding structures were obtained without intravenous contrast. COMPARISON:  MRI head 09/15/2017.  CT head 07/19/2019 FINDINGS: Brain: Generalized atrophy. Patchy white matter hypodensity bilaterally. Mild patchy hyperintensity in the pons bilaterally Negative for acute infarct.  Negative for hemorrhage or mass. No cortical edema is identified.  Negative for PRES. Vascular: Normal arterial flow voids. Skull and upper cervical spine: No focal skeletal lesion. Normal bone marrow. ACDF cervical spine. Sinuses/Orbits: Mild mucosal edema paranasal sinuses. Bilateral ocular surgery. Scleral buckle on the right. Other:  None IMPRESSION: No acute abnormality.  Negative for PRES. Atrophy and moderate chronic microvascular ischemic changes. Electronically Signed   By: Marlan Palau M.D.   On: 07/19/2019 16:29   EEG adult  Result Date: 07/22/2019 Charlsie Quest, MD     07/22/2019 12:19 PM Patient Name: Priscilla Tran MRN: [id number] Epilepsy Attending: Charlsie Quest Referring Physician/Provider: Delton See, PA Date: 07/22/2019 Duration: 23.26 minutes Patient history: 84 year old female with an episode of right arm numbness and dropping things followed by blurry vision in the left eye (blind in right eye) and episode of confusion.  EEG evaluate for seizures. Level of alertness: Awake, drowsy AEDs during EEG study: None Technical aspects: This EEG study was done with scalp electrodes positioned according to the 10-20 International system of electrode placement. Electrical activity was acquired at a sampling rate of 500Hz  and reviewed with a high frequency filter of 70Hz  and a low frequency filter of 1Hz . EEG data were recorded continuously and digitally stored. Description: The posterior dominant rhythm consists of 8 Hz activity of moderate voltage (25-35 uV) seen predominantly in posterior head regions, symmetric and reactive to eye opening and eye closing. Drowsiness was characterized by attenuation of the posterior background rhythm.  Single sharp transient was noticed in left frontotemporal region, maximal F7. Hyperventilation and photic stimulation were not performed.   IMPRESSION: This study is within normal limits. No seizures or epileptiform discharges were seen throughout the recording. However, only wakefulness and drowsiness were recorded. If suspicion for interictal activity remains a concern, a prolonged study including sleep can be considered. Priyanka  ECHOCARDIOGRAM COMPLETE  Result Date: 07/20/2019    ECHOCARDIOGRAM REPORT   Patient Name:   Ouita TAKIYA BELMARES Date of Exam: 07/20/2019 Medical Rec #:   [id number]      Height:       64.5 in Accession #:    [id number]     Weight:       146.6 lb Date of Birth:  [birth year]      BSA:          1.724 m Patient Age:    83 years       BP:           152/71 mmHg Patient Gender: F              HR:           52 bpm. Exam Location:  Inpatient Procedure: 2D Echo, Cardiac Doppler and Color Doppler Indications:    TIA 435.9 / G45.9  History:        Patient has prior history of Echocardiogram examinations, most                 recent 08/23/2017. Risk Factors:Hypertension, Diabetes and                 Dyslipidemia. Hypothyroidism.  Sonographer:    Elmarie Shiley Dance Referring Phys: [id number] Emeline General IMPRESSIONS  1. Left ventricular ejection fraction, by estimation, is 60 to 65%. The left ventricle has normal function. The left ventricle has no regional wall motion abnormalities. Left ventricular diastolic parameters are consistent with Grade I diastolic dysfunction (impaired relaxation).  2. Right ventricular systolic function is normal. The right ventricular size is normal.  3. The mitral valve is normal in structure. Mild mitral valve regurgitation. No evidence of mitral stenosis.  4. The aortic valve is normal in structure. Aortic valve regurgitation is not visualized. No aortic stenosis is present. FINDINGS  Left Ventricle: Left ventricular ejection fraction, by estimation, is 60 to 65%. The left ventricle has normal function. The left ventricle has no regional wall motion abnormalities. The left ventricular internal cavity size was normal in size. There is  no left ventricular hypertrophy. Left ventricular diastolic parameters are consistent with Grade I diastolic dysfunction (impaired relaxation). Right Ventricle: The right ventricular size is normal. No increase in right ventricular wall thickness. Right ventricular systolic function is normal. Left Atrium: Left atrial size was normal in size. Right Atrium: Right atrial size was normal in size. Pericardium: There is no evidence  of pericardial effusion. Mitral Valve: The mitral valve is normal in structure. Mild mitral valve regurgitation. No evidence of mitral valve stenosis. Tricuspid Valve: The tricuspid valve is normal in structure. Tricuspid valve regurgitation is trivial. No evidence of tricuspid stenosis. Aortic Valve: The aortic valve is normal in structure. Aortic valve regurgitation is not visualized. No aortic stenosis is present. Pulmonic Valve: The pulmonic valve was normal in structure. Pulmonic valve regurgitation is not visualized. No evidence of pulmonic stenosis. Aorta: The aortic root and ascending aorta are structurally normal, with no evidence of dilitation. IAS/Shunts: The atrial septum is grossly normal.  LEFT VENTRICLE PLAX 2D LVIDd:         4.00 cm  Diastology LVIDs:         3.00 cm  LV e' lateral:   4.35 cm/s LV PW:         1.00 cm  LV E/e' lateral: 11.8 LV IVS:        1.00 cm  LV e' medial:  4.68 cm/s LVOT diam:     2.20 cm  LV E/e' medial:  11.0 LV SV:         67 LV SV Index:   39 LVOT Area:     3.80 cm  RIGHT VENTRICLE             IVC RV Basal diam:  2.90 cm     IVC diam: 1.40 cm RV S prime:     14.60 cm/s TAPSE (M-mode): 2.3 cm LEFT ATRIUM             Index       RIGHT ATRIUM           Index LA diam:        3.60 cm 2.09 cm/m  RA Area:     17.30 cm LA Vol (A2C):   48.7 ml 28.25 ml/m RA Volume:   42.10 ml  24.43 ml/m LA Vol (A4C):   53.8 ml 31.21 ml/m LA Biplane Vol: 53.8 ml 31.21 ml/m  AORTIC VALVE LVOT Vmax:   71.80 cm/s LVOT Vmean:  48.400 cm/s LVOT VTI:    0.175 m  AORTA Ao Root diam: 3.40 cm Ao Asc diam:  3.40 cm MITRAL VALVE MV Area (PHT): 2.11 cm    SHUNTS MV Decel Time: 359 msec    Systemic VTI:  0.18 m MV E velocity: 51.40 cm/s  Systemic Diam: 2.20 cm MV A velocity: 78.40 cm/s MV E/A ratio:  0.66 Kristeen Miss MD Electronically signed by Kristeen Miss MD Signature Date/Time: 07/20/2019/12:04:20 PM    Final    CT HEAD CODE STROKE WO CONTRAST  Result Date: 07/19/2019 CLINICAL DATA:  Code  stroke.  Confusion and slurred speech EXAM: CT HEAD WITHOUT CONTRAST TECHNIQUE: Contiguous axial images were obtained from the base of the skull through the vertex without intravenous contrast. COMPARISON:  04/30/2019 FINDINGS: Brain: There is no acute intracranial hemorrhage, mass effect, or edema. Gray-white differentiation is preserved. There is no extra-axial fluid collection. Ventricles and sulci are within normal limits in size and configuration. Patchy hypoattenuation in the supratentorial white matter is nonspecific but probably reflects stable chronic microvascular ischemic changes Vascular: No hyperdense vessel or unexpected calcification. Skull: Calvarium is unremarkable. Sinuses/Orbits: No acute finding. Other: None. ASPECTS (Alberta Stroke Program Early CT Score) - Ganglionic level infarction (caudate, lentiform nuclei, internal capsule, insula, M1-M3 cortex): 7 - Supraganglionic infarction (M4-M6 cortex): 3 Total score (0-10 with 10 being normal): 10 IMPRESSION: No acute intracranial hemorrhage or evidence of acute infarction. ASPECTS is 10. Stable chronic microvascular ischemic changes. These results were communicated to Dr. Laurence Slate at 1:25 pmon 6/22/2021by text page via the [facility] messaging system. Electronically Signed   By: Guadlupe Spanish M.D.   On: 07/19/2019 13:27   VAS US CAROTID  Result Date: 07/20/2019 Carotid Arterial Duplex Study Indications:       Suspected TIA with no acute infarct noted on CT. Other Factors:     Chronic microvascular ischemic changes. Comparison Study:  08/23/17 bilat 1-39% Performing Technologist: Jeb Levering RDMS, RVT  Examination Guidelines: A complete evaluation includes B-mode imaging, spectral Doppler, color Doppler, and power Doppler as needed of all accessible portions of each vessel. Bilateral testing is considered an integral part of a complete examination. Limited examinations for reoccurring indications may be performed as noted.  Right Carotid Findings:  +----------+--------+--------+--------+------------------+------------------+           PSV cm/sEDV cm/sStenosisPlaque DescriptionComments           +----------+--------+--------+--------+------------------+------------------+ CCA Prox  59      18                                                   +----------+--------+--------+--------+------------------+------------------+ CCA Distal58      16                                                   +----------+--------+--------+--------+------------------+------------------+ ICA Prox  45      14      1-39%   heterogenous      intimal thickening +----------+--------+--------+--------+------------------+------------------+ ICA Distal70      22                                                   +----------+--------+--------+--------+------------------+------------------+ ECA       65      11                                                   +----------+--------+--------+--------+------------------+------------------+ +----------+--------+-------+----------------+-------------------+           PSV cm/sEDV cmsDescribe        Arm Pressure (mmHG) +----------+--------+-------+----------------+-------------------+ [id number]             Multiphasic, WNL                    +----------+--------+-------+----------------+-------------------+ +---------+--------+--+--------+--+---------+ VertebralPSV cm/s26EDV cm/s10Antegrade +---------+--------+--+--------+--+---------+  Left Carotid Findings: +----------+--------+--------+--------+------------------+--------+           PSV cm/sEDV cm/sStenosisPlaque DescriptionComments +----------+--------+--------+--------+------------------+--------+ CCA Prox  73      19                                         +----------+--------+--------+--------+------------------+--------+ CCA Distal59      21                                          +----------+--------+--------+--------+------------------+--------+ ICA Prox  122     33      1-39%   heterogenous               +----------+--------+--------+--------+------------------+--------+ ICA Distal65      22                                         +----------+--------+--------+--------+------------------+--------+ ECA       79      5                                          +----------+--------+--------+--------+------------------+--------+ +----------+--------+--------+----------------+-------------------+  PSV cm/sEDV cm/sDescribe        Arm Pressure (mmHG) +----------+--------+--------+----------------+-------------------+ [id number]             Multiphasic, WNL                    +----------+--------+--------+----------------+-------------------+ +---------+--------+--+--------+-+---------+ VertebralPSV cm/s45EDV cm/s9Antegrade +---------+--------+--+--------+-+---------+   Summary: Right Carotid: Velocities in the right ICA are consistent with a 1-39% stenosis. Left Carotid: Velocities in the left ICA are consistent with a 1-39% stenosis.  *See table(s) above for measurements and observations.  Electronically signed by Gretta Began MD on 07/20/2019 at 3:44:12 PM.    Final    CT ANGIO HEAD CODE STROKE  Result Date: 07/21/2019 CLINICAL DATA:  Altered mental status.  Rule out stroke. EXAM: CT ANGIOGRAPHY HEAD AND NECK TECHNIQUE: Multidetector CT imaging of the head and neck was performed using the standard protocol during bolus administration of intravenous contrast. Multiplanar CT image reconstructions and MIPs were obtained to evaluate the vascular anatomy. Carotid stenosis measurements (when applicable) are obtained utilizing NASCET criteria, using the distal internal carotid diameter as the denominator. CONTRAST:  OMNIPAQUE IOHEXOL 350 MG/ML SOLN COMPARISON:  CT head 07/21/2019.  MRI head 07/19/2019 FINDINGS: CTA NECK FINDINGS Aortic arch:  Standard branching. Imaged portion shows no evidence of aneurysm or dissection. No significant stenosis of the major arch vessel origins. Right carotid system: Mild atherosclerotic disease in the right carotid bifurcation without significant stenosis or irregularity Left carotid system: Mild atherosclerotic disease left carotid bifurcation. Calcification left carotid bulb. Less than 25% diameter stenosis proximal left internal carotid artery. No irregularity. Vertebral arteries: Both vertebral arteries are patent to the basilar. Moderate calcific stenosis at the origin of the right vertebral artery. No left vertebral artery stenosis. Skeleton: ACDF C3 through C7 with solid fusion. No acute skeletal abnormality. Extensive degenerative change at C1-2 extending to the left. Other neck: Negative for mass or adenopathy. Upper chest: Lung apices clear bilaterally. Review of the MIP images confirms the above findings CTA HEAD FINDINGS Anterior circulation: Mild atherosclerotic disease in the cavernous carotid bilaterally without significant stenosis. Anterior and middle cerebral arteries patent bilaterally without stenosis or large vessel occlusion. Posterior circulation: Both vertebral arteries patent to the basilar. PICA patent bilaterally. Basilar widely patent. Basilar ends in the superior cerebellar arteries bilaterally which are patent. Fetal origin of the posterior cerebral artery bilaterally without significant stenosis. Venous sinuses: Normal venous enhancement. Anatomic variants: None Review of the MIP images confirms the above findings IMPRESSION: Mild atherosclerotic disease in the carotid bifurcation bilaterally without significant stenosis Moderate stenosis origin of right vertebral artery which is then patent to the basilar. No significant left vertebral artery stenosis Negative for intracranial large vessel occlusion or flow limiting stenosis. Electronically Signed   By: Marlan Palau M.D.   On: 07/21/2019  19:29    Microbiology: Recent Results (from the past 240 hour(s))  SARS Coronavirus 2 by RT PCR (hospital order, performed in [facility] hospital lab) Nasopharyngeal Nasopharyngeal Swab     Status: None   Collection Time: 07/19/19 11:16 PM   Specimen: Nasopharyngeal Swab  Result Value Ref Range Status   SARS Coronavirus 2 NEGATIVE NEGATIVE Final    Comment: (NOTE) SARS-CoV-2 target nucleic acids are NOT DETECTED.  The SARS-CoV-2 RNA is generally detectable in upper and lower respiratory specimens during the acute phase of infection. The lowest concentration of SARS-CoV-2 viral copies this assay can detect is 250 copies / mL. A negative result does not preclude SARS-CoV-2 infection and should  not be used as the sole basis for treatment or other patient management decisions.  A negative result may occur with improper specimen collection / handling, submission of specimen other than nasopharyngeal swab, presence of viral mutation(s) within the areas targeted by this assay, and inadequate number of viral copies (<250 copies / mL). A negative result must be combined with clinical observations, patient history, and epidemiological information.  Fact Sheet for Patients:   BoilerBrush.com.cyhttps://www.fda.gov/media/136312/download  Fact Sheet for Healthcare Providers: https://pope.com/https://www.fda.gov/media/136313/download  This test is not yet approved or  cleared by the [place] FDA and has been authorized for detection and/or diagnosis of SARS-CoV-2 by FDA under an Emergency Use Authorization (EUA).  This EUA will remain in effect (meaning this test can be used) for the duration of the COVID-19 declaration under Section 564(b)(1) of the Act, 21 U.S.C. section 360bbb-3(b)(1), unless the authorization is terminated or revoked sooner.  Performed at [facility], [address] [place], [place] [address]   Urine culture     Status: Abnormal   Collection Time: 07/21/19  3:34 PM   Specimen: Urine,  Clean Catch  Result Value Ref Range Status   Specimen Description URINE, CLEAN CATCH  Final   Special Requests NONE  Final   Culture (A)  Final    <10,000 COLONIES/mL INSIGNIFICANT GROWTH Performed at [facility], [address] [place], [place] [address]    Report Status 07/22/2019 FINAL  Final  SARS Coronavirus 2 by RT PCR (hospital order, performed in [facility] hospital lab) Nasopharyngeal Nasopharyngeal Swab     Status: None   Collection Time: 07/21/19  7:40 PM   Specimen: Nasopharyngeal Swab  Result Value Ref Range Status   SARS Coronavirus 2 NEGATIVE NEGATIVE Final    Comment: (NOTE) SARS-CoV-2 target nucleic acids are NOT DETECTED.  The SARS-CoV-2 RNA is generally detectable in upper and lower respiratory specimens during the acute phase of infection. The lowest concentration of SARS-CoV-2 viral copies this assay can detect is 250 copies / mL. A negative result does not preclude SARS-CoV-2 infection and should not be used as the sole basis for treatment or other patient management decisions.  A negative result may occur with improper specimen collection / handling, submission of specimen other than nasopharyngeal swab, presence of viral mutation(s) within the areas targeted by this assay, and inadequate number of viral copies (<250 copies / mL). A negative result must be combined with clinical observations, patient history, and epidemiological information.  Fact Sheet for Patients:   BoilerBrush.com.cyhttps://www.fda.gov/media/136312/download  Fact Sheet for Healthcare Providers: https://pope.com/https://www.fda.gov/media/136313/download  This test is not yet approved or  cleared by the [place] FDA and has been authorized for detection and/or diagnosis of SARS-CoV-2 by FDA under an Emergency Use Authorization (EUA).  This EUA will remain in effect (meaning this test can be used) for the duration of the COVID-19 declaration under Section 564(b)(1) of the Act, 21 U.S.C. section  360bbb-3(b)(1), unless the authorization is terminated or revoked sooner.  Performed at [facility], [address] [place], [place] [address]      Labs: Basic Metabolic Panel: Recent Labs  Lab 07/19/19 1311 07/19/19 1422 07/21/19 1533 07/21/19 1944 07/22/19 0228  NA 137 137 133*  --  136  K 4.2 4.3 4.3  --  3.5  CL 100 102 98  --  100  CO2  --  23 22  --  25  GLUCOSE 108* 111* 132*  --  135*  BUN 12  11 13  --  9  CREATININE 0.70 0.74 0.72 0.69 0.58  CALCIUM  --  9.7 9.5  --  9.3   Liver Function Tests: Recent Labs  Lab 07/19/19 1422 07/21/19 1533  AST 21 23  ALT 11 14  ALKPHOS 65 62  BILITOT 1.0 1.0  PROT 6.9 7.3  ALBUMIN 4.2 4.3   No results for input(s): LIPASE, AMYLASE in the last 168 hours. Recent Labs  Lab 07/21/19 1548  AMMONIA 19   CBC: Recent Labs  Lab 07/19/19 1311 07/19/19 1422 07/21/19 1533 07/21/19 1944 07/22/19 0228  WBC  --  7.6 10.0 8.3 6.9  NEUTROABS  --  4.6 7.0  --  4.0  HGB 13.3 12.1 12.5 11.8* 11.4*  HCT 39.0 39.2 38.9 36.6 35.3*  MCV  --  86.0 82.9 83.4 82.3  PLT  --  214 234 230 211   Cardiac Enzymes: No results for input(s): CKTOTAL, CKMB, CKMBINDEX, TROPONINI in the last 168 hours. BNP: BNP (last 3 results) No results for input(s): BNP in the last 8760 hours.  ProBNP (last 3 results) No results for input(s): PROBNP in the last 8760 hours.  CBG: Recent Labs  Lab 07/22/19 1145 07/22/19 1505 07/22/19 2125 07/23/19 0649 07/23/19 1217  GLUCAP 120* 165* 127* 106* 162*       Signed:  Domenic Polite MD.  [facility] 07/23/2019, 12:32 PM

## 2019-07-23 NOTE — Care Management (Signed)
Benefit check sent for Brilinta. This will result on Monday

## 2019-07-23 NOTE — Progress Notes (Signed)
Pt attempted three times. One last night, which she refused and said she wanted it done today during the morning. Attempted twice this morning, pt has requested delay due to morning routine and then to wait for breakfast.

## 2019-07-23 NOTE — Progress Notes (Signed)
STROKE TEAM PROGRESS NOTE   HISTORY OF PRESENT ILLNESS  Daughter at the bedside.  Patient lying in bed, awake alert and orientated.  Daughter stated that patient has visual hallucinations intermittently, patient knows that it was not real.  MRI repeat negative for acute infarct.  OBJECTIVE Vitals:   07/22/19 2315 07/23/19 0339 07/23/19 0738 07/23/19 1223  BP: 132/60 (!) 156/81 (!) 144/97 126/67  Pulse: (!) 51 (!) 56 (!) 55 60  Resp: 18 20 16 18   Temp: 98.1 F (36.7 C) 97.6 F (36.4 C) 99 F (37.2 C) 99.4 F (37.4 C)  TempSrc: Oral Oral Oral Oral  SpO2: 100% 98% 99% 98%  Weight:      Height:        CBC:  Recent Labs  Lab 07/21/19 1533 07/21/19 1533 07/21/19 1944 07/22/19 0228  WBC 10.0   < > 8.3 6.9  NEUTROABS 7.0  --   --  4.0  HGB 12.5   < > 11.8* 11.4*  HCT 38.9   < > 36.6 35.3*  MCV 82.9   < > 83.4 82.3  PLT 234   < > 230 211   < > = values in this interval not displayed.    Basic Metabolic Panel:  Recent Labs  Lab 07/21/19 1533 07/21/19 1533 07/21/19 1944 07/22/19 0228  NA 133*  --   --  136  K 4.3  --   --  3.5  CL 98  --   --  100  CO2 22  --   --  25  GLUCOSE 132*  --   --  135*  BUN 13  --   --  9  CREATININE 0.72   < > 0.69 0.58  CALCIUM 9.5  --   --  9.3   < > = values in this interval not displayed.    Lipid Panel:     Component Value Date/Time   CHOL 217 (H) 07/21/2019 1944   TRIG 71 07/21/2019 1944   HDL 78 07/21/2019 1944   CHOLHDL 2.8 07/21/2019 1944   VLDL 14 07/21/2019 1944   LDLCALC 125 (H) 07/21/2019 1944   HgbA1c:  Lab Results  Component Value Date   HGBA1C 6.4 (H) 07/22/2019   Urine Drug Screen:     Component Value Date/Time   LABOPIA NONE DETECTED 07/19/2019 1430   COCAINSCRNUR Negative 07/21/2019 1534   LABBENZ NONE DETECTED 07/19/2019 1430   AMPHETMU NONE DETECTED 07/19/2019 1430   THCU NONE DETECTED 07/19/2019 1430   LABBARB Negative 07/21/2019 1534   LABBARB NONE DETECTED 07/19/2019 1430    Alcohol Level      Component Value Date/Time   ETH <10 07/21/2019 1548    IMAGING  CT Head Wo Contrast 07/21/2019 IMPRESSION:  1. No acute intracranial findings.  2. Chronic microvascular ischemic change and cerebral volume loss.   CT ANGIO HEAD CODE STROKE CT ANGIO NECK W OR WO CONTRAST 07/21/2019 IMPRESSION:  Mild atherosclerotic disease in the carotid bifurcation bilaterally without significant stenosis. Moderate stenosis origin of right vertebral artery which is then patent to the basilar. No significant left vertebral artery stenosis Negative for intracranial large vessel occlusion or flow limiting stenosis.   MRI WO Contrast 07/23/19 IMPRESSION:  Negative for acute infarct and no change from the recent study.  Moderate chronic microvascular ischemic change. Mild superficial siderosis due to prior subarachnoid hemorrhage. This was also present in 2019.  MRI Brain WO Contrast 07/19/19 IMPRESSION: No acute abnormality.  Negative for PRES. Atrophy  and moderate chronic microvascular ischemic changes.  VAS US CAROTID 07/20/2019 Summary:  Right Carotid: Velocities in the right ICA are consistent with a 1-39% stenosis.  Left Carotid: Velocities in the left ICA are consistent with a 1-39% stenosis.    Transthoracic Echocardiogram  07/20/19 IMPRESSIONS  1. Left ventricular ejection fraction, by estimation, is 60 to 65%. The  left ventricle has normal function. The left ventricle has no regional  wall motion abnormalities. Left ventricular diastolic parameters are  consistent with Grade I diastolic  dysfunction (impaired relaxation).  2. Right ventricular systolic function is normal. The right ventricular  size is normal.  3. The mitral valve is normal in structure. Mild mitral valve  regurgitation. No evidence of mitral stenosis.  4. The aortic valve is normal in structure. Aortic valve regurgitation is  not visualized. No aortic stenosis is present.  ECG - SR rate 61 BPM. (See  cardiology reading for complete details)  EEG 07/21/20 IMPRESSION: This study is within normal limits. No seizures or epileptiform discharges were seen throughout the recording. However, only wakefulness and drowsiness were recorded. If suspicion for interictal activity remains a concern, a prolonged study including sleep can be considered.    PHYSICAL EXAM Blood pressure 126/67, pulse 60, temperature 99.4 F (37.4 C), temperature source Oral, resp. rate 18, height 5\' 4"  (1.626 m), weight 66.5 kg, SpO2 98 %.   Pleasant elderly African-American lady not in distress. Afebrile. Head is nontraumatic. Neck is supple without bruit.    Cardiac exam no murmur or gallop. Lungs are clear to auscultation. Distal pulses are well felt.  Neurological Exam ;  Awake  Alert oriented x 3. Normal speech and language. Diminished attention, recall.  Follows commands well.  Eye movements full without nystagmus. Fundi were not visualized.  She is blind in the right eye which shows corneal opacity.  Able to see hand waving in the left eye. Face symmetric. Tongue midline. Normal strength, tone, reflexes and coordination. Normal sensation. Gait deferred.   ASSESSMENT/PLAN Priscilla Tran is a 84 y.o. female with history of hypothyroidism, hypertension, hyperlipidemia, glaucoma, and diabetes recently in the hospital secondary to confusion in the setting of hypertension urgency with normal MRI presenting with AMS and agitation. She did not receive IV t-PA due to improving deficits.  TIA vs. Encephalopathy   Resultant deficits resolved  CT head -  No acute intracranial findings. Chronic microvascular ischemic change and cerebral volume loss.   MRI head - 07/19/19 - No acute abnormality.  Negative for PRES. Atrophy and moderate chronic microvascular ischemic changes.  MRI repeat 07/23/19 - Negative for acute infarct. Mild superficial siderosis due to prior subarachnoid hemorrhage. Also present in 2019.  CTA H&N -  unremarkable  Carotid Doppler - 07/20/19 - 1-39% stenosis bilaterally  2D Echo - 07/20/19 - EF 60 - 65%. No cardiac source of emboli identified.   Sars Corona Virus 2 - negative  EEG - 07/22/19 - This study is within normal limits.  LDL - 125  HgbA1c - 6.4  UDS - negative  VTE prophylaxis - Lovenox  clopidogrel 75 mg daily prior to admission, now on clopidogrel 75 mg daily and ASA 81 DAPT for 3 weeks and then plavix alone  Patient counseled to be compliant with her antithrombotic medications  Ongoing aggressive stroke risk factor management  Therapy recommendations:  HH PT recommended  Disposition:  Pending  Visual hallucination  Likely due to vision loss and 07/24/19 syndrome  Cognitive impairment also a contributor  Feels that patient able to improve at home environment  Hypertension  Home BP meds: Norvasc ; Coreg  Current BP meds: none   Stable . Long-term BP goal normotensive  Hyperlipidemia  Home Lipid lowering medication: Lipitor 40 mg daily   LDL 125, goal < 70  Current lipid lowering medication: Lipitor 40 mg daily   Continue statin at discharge  Diabetes  Home diabetic meds: metformin   Current diabetic meds: SSI   HgbA1c 6.4, goal < 7.0 Recent Labs    07/22/19 2125 07/23/19 0649 07/23/19 1217  GLUCAP 127* 106* 162*    Other Stroke Risk Factors  Advanced age  ETOH use, advised to drink no more than 1 alcoholic beverage per day.  Other Active Problems  Code status - Full code  Bradycardia - 40's at times - may want to avoid Coreg  F/U with Dr Leonie Man. [facility] PT recommended   Hospital day # 2  Neurology will sign off. Please call with questions. Pt will follow up with stroke clinic Dr. Leonie Man at [facility] in about 4 weeks. Thanks for the consult.  Rosalin Hawking, MD PhD Stroke Neurology 07/23/2019 7:31 PM   To contact Stroke Continuity provider, please refer to [email or web address] After hours, contact General Neurology

## 2019-07-23 NOTE — Progress Notes (Signed)
Patient ready for discharge to home; daughters present at bedside; discharge instructions given and reviewed; daughters assisted in dressing the patient for discharge; patient discharged out via wheelchair; accompanied by her family.  All belongings returned.

## 2019-08-25 ENCOUNTER — Ambulatory Visit: Payer: PRIVATE HEALTH INSURANCE | Admitting: Neurology

## 2019-08-25 ENCOUNTER — Ambulatory Visit (INDEPENDENT_AMBULATORY_CARE_PROVIDER_SITE_OTHER): Payer: Medicare HMO | Admitting: Neurology

## 2019-08-25 ENCOUNTER — Encounter: Payer: Self-pay | Admitting: Neurology

## 2019-08-25 VITALS — BP 130/66 | Ht 65.0 in | Wt 148.0 lb

## 2019-08-25 DIAGNOSIS — R410 Disorientation, unspecified: Secondary | ICD-10-CM

## 2019-08-25 DIAGNOSIS — G459 Transient cerebral ischemic attack, unspecified: Secondary | ICD-10-CM

## 2019-08-25 NOTE — Progress Notes (Signed)
[facility] [address] [place]. [place] [address] [phone]       OFFICE FOLLOW-UP NOTE  [name] Date of Birth:  08-13-1935 Medical Record Number:  [id number]   HPI: [name] is a pleasant 84 year old African-American lady seen today for initial office follow-up visit following hospital consultation in June 2021 for confusion.  She is accompanied by daughter.  History is obtained from them and review of electronic medical records and imaging films in PACS.  She has past medical history of hyperlipidemia, hypertension, hypothyroidism, glaucoma, diabetes and left eye blindness who was initially seen at [facility] on 07/19/2019 when she presented with transient confusion in setting of significantly elevated blood pressure.  This was felt to be hypertensive urgency as MRI scan of the brain was negative for acute stroke.  Confusion resolved and she was sent home but she returned 2 days later again with confusion and this time the blood pressure was not significantly elevated.  She had no recall for a short.  Of time when she states she was having breakfast and started feeling nauseous and felt weak all over.  She called her daughter and daughter noticed over the phone that patient seemed a little agitated and did not make much sense.  EMS was called and brought her to the hospital but by the time she reached the hospital she was back to baseline.  Her MRI scan of the brain was repeated on 07/23/2019 and was again negative for acute abnormality.  Echocardiogram showed normal ejection fraction.  CT angiogram of the brain and neck did not show large vessel stenosis or occlusion.  EEG was obtained during the second admission on 07/22/2019 and was normal.  LDL cholesterol 125 mg percent.  Hemoglobin A1c was 6.4.  Patient has done well since then.  She is living at home with her daughter.  She is able to ambulate with a walker and walks only short distances up to the  mailbox.  She is blind in the right eye and has limited vision in the left eye.  The patient daughter feels in retrospect that the patient may have been dropping some of her pills when she takes them out of the bottle and takes several pills at the same time and because of limited vision she does not realize the pills have fallen and perhaps may have missed some of her doses of blood pressure medicines relating to significant elevation in blood pressure.  Blood pressure seems to be much better controlled now.  She does have a remote history of a TIA as well as cervical spine degenerative disease status post surgery in 2018 since then she has some residual left leg weakness and numbness and stiffness.  She denies any recent neck pain or radicular pain or falls.  MRI scan cervical spine 09/16/2017 shows degenerative cervical spine disease with C4-5 to C6-7 myelomalacia and severe canal stenosis C5-6 and moderate canal stenosis C3-4, C4-5 and C6-7. ROS:   14 system review of systems is positive for poor vision, loss of vision, confusion, memory loss, walking difficulty, numbness, weakness and all other systems negative  PMH:  Past Medical History:  Diagnosis Date  . Diabetes mellitus   . Disc degeneration, lumbar    Dr. Regino Schultze  . Glaucoma associated with ocular inflammation, severe stage    blind Right eye  . Hyperlipidemia   . Hypertension   . Hypothyroid   . SBO (small bowel obstruction) s/p lap LOA [id number] 03/31/2011  Social History:  Social History   Socioeconomic History  . Marital status: Widowed    Spouse name: Not on file  . Number of children: Not on file  . Years of education: Not on file  . Highest education level: Not on file  Occupational History  . Not on file  Tobacco Use  . Smoking status: Never Smoker  . Smokeless tobacco: Never Used  Substance and Sexual Activity  . Alcohol use: Yes    Comment: occasional/social on weekend  . Drug use: No  . Sexual activity: Never    Other Topics Concern  . Not on file  Social History Narrative  . Not on file   Social Determinants of Health   Financial Resource Strain:   . Difficulty of Paying Living Expenses:   Food Insecurity:   . Worried About Programme researcher, broadcasting/film/video in the Last Year:   . Barista in the Last Year:   Transportation Needs:   . Freight forwarder (Medical):   Marland Kitchen Lack of Transportation (Non-Medical):   Physical Activity:   . Days of Exercise per Week:   . Minutes of Exercise per Session:   Stress:   . Feeling of Stress :   Social Connections:   . Frequency of Communication with Friends and Family:   . Frequency of Social Gatherings with Friends and Family:   . Attends Religious Services:   . Active Member of Clubs or Organizations:   . Attends Banker Meetings:   Marland Kitchen Marital Status:   Intimate Partner Violence:   . Fear of Current or Ex-Partner:   . Emotionally Abused:   Marland Kitchen Physically Abused:   . Sexually Abused:     Medications:   Current Outpatient Medications on File Prior to Visit  Medication Sig Dispense Refill  . acetaminophen (TYLENOL) 325 MG tablet Take 2 tablets (650 mg total) by mouth every 4 (four) hours as needed for mild pain (or temp > 37.5 C (99.5 F)). 30 tablet 1  . amLODipine (NORVASC) 10 MG tablet Take 1 tablet (10 mg total) by mouth daily. 30 tablet 1  . atorvastatin (LIPITOR) 40 MG tablet Take 1 tablet (40 mg total) by mouth daily. 90 tablet 0  . brimonidine (ALPHAGAN) 0.2 % ophthalmic solution Place 1 drop into the left eye in the morning and at bedtime.    . carvedilol (COREG) 12.5 MG tablet Take 1 tablet (12.5 mg total) by mouth 2 (two) times daily with a meal. 60 tablet 1  . clopidogrel (PLAVIX) 75 MG tablet TAKE 1 TABLET BY MOUTH DAILY (HOLD UNTIL 10/08/17) (Patient taking differently: Take 75 mg by mouth daily. ) 30 tablet 0  . furosemide (LASIX) 20 MG tablet Take by mouth.    . levothyroxine (SYNTHROID, LEVOTHROID) 150 MCG tablet Take 1  tablet (150 mcg total) by mouth daily. (Patient taking differently: Take 100 mcg by mouth daily. ) 30 tablet 2  . metFORMIN (GLUCOPHAGE) 1000 MG tablet Take 1 tablet (1,000 mg total) by mouth 2 (two) times daily with a meal. 60 tablet 2  . potassium chloride (KLOR-CON) 10 MEQ tablet Take by mouth.    . prednisoLONE acetate (PRED FORTE) 1 % ophthalmic suspension Place 1 drop into the left eye 3 (three) times daily.      No current facility-administered medications on file prior to visit.    Allergies:   Allergies  Allergen Reactions  . Lisinopril Swelling    FACE THROAT " Breathing not affected  per patient."    Physical Exam General: Frail elderly African-American lady, seated, in no evident distress Head: head normocephalic and atraumatic.  Neck: supple with no carotid or supraclavicular bruits Cardiovascular: regular rate and rhythm, no murmurs Musculoskeletal: no deformity Skin:  no rash/petichiae.  Mild ankle edema bilaterally. Vascular:  Normal pulses all extremities There were no vitals filed for this visit. Neurologic Exam Mental Status: Awake and fully alert. Oriented to place and time. Recent and remote memory diminished. Attention span, concentration and fund of knowledge slightly diminished mood and affect appropriate.  Recall 1/3.  Able to name only 9 animals which can walk on 4 legs.  Unable to drop clock or do Mini-Mental due to limited vision Cranial Nerves: Fundoscopic exam not done.  Right eye is exotropic with corneal opacity and blind.  Diminished vision acuity in the left eye but able to count fingers at 3 to 4 feet.  Right pupil not visualized.  Left pupil sluggishly reactive.  Extraocular movements full without nystagmus. Visual fields full to confrontation in the left eye only.Marland Kitchen Hearing mildly diminished bilaterally.. Facial sensation intact. Face, tongue, palate moves normally and symmetrically.  Motor: Normal bulk and tone. Normal strength in all tested extremity  muscles.  Except mild weakness of left hip flexors and ankle dorsiflexors.  Tone is increased in the left leg compared to the right. Sensory.: intact to touch ,pinprick .position and vibratory sensation except slight decrease sensation in the left leg from the hip down..  Coordination: Rapid alternating movements normal in all extremities. Finger-to-nose and heel-to-shin performed accurately on the right but slightly impaired in the left leg. Gait and Station: Deferred as patient is in wheelchair and did not bring her walker Reflexes: 2+ and asymmetric and brisker on the left. Toes downgoing.      ASSESSMENT: 84 year old lady with 2 transient episodes of confusion in June 2021 of unclear etiology possibly TIA versus cognitive impairment.  Doubt seizures.  She has mild cognitive impairment which may be age-appropriate.  She has longstanding gait difficulties from cervical myelopathy with residual left leg stiffness numbness and weakness.     PLAN: I had a long discussion the patient and her daughter regarding her 2 episodes of transient confusion being of unclear etiology possibly TIAs versus seizures.  I recommend she continue Plavix for stroke prevention and maintain strict blood pressure control with with goal below 130/90 and lipids with LDL cholesterol goal below 70 mg percent.  She was advised to use her cane at all times and we discussed fall safety precautions.  Check EEG for any seizure activity.  Continue home physical occupational therapy.  She will return for follow-up in the future in 3 months with my nurse practitioner Shanda Bumps or call earlier if necessary. Greater than 50% of time during this 30 minute visit was spent on counseling,explanation of diagnosis, planning of further management, discussion with patient and family and coordination of care Delia Heady, MD  [facility] [address] [place], [place] [address]  Phone [phone] Fax  [phone] Note: This document was prepared with digital dictation and possible smart phrase technology. Any transcriptional errors that result from this process are unintentional

## 2019-08-25 NOTE — Patient Instructions (Addendum)
I had a long discussion the patient and her daughter regarding her 2 episodes of transient confusion being of unclear etiology possibly TIAs versus seizures.  I recommend she continue Plavix for stroke prevention and maintain strict blood pressure control with with goal below 130/90 and lipids with LDL cholesterol goal below 70 mg percent.  She was advised to use her cane at all times and we discussed fall safety precautions.  Check EEG for any seizure activity.  Continue home physical occupational therapy.  She will return for follow-up in the future in 3 months with my nurse practitioner Shanda Bumps or call earlier if necessary

## 2019-09-21 ENCOUNTER — Other Ambulatory Visit: Payer: PRIVATE HEALTH INSURANCE

## 2019-09-26 ENCOUNTER — Other Ambulatory Visit: Payer: Self-pay

## 2019-09-26 ENCOUNTER — Ambulatory Visit (INDEPENDENT_AMBULATORY_CARE_PROVIDER_SITE_OTHER): Payer: Medicare HMO | Admitting: Neurology

## 2019-09-26 DIAGNOSIS — R410 Disorientation, unspecified: Secondary | ICD-10-CM

## 2019-09-26 DIAGNOSIS — G459 Transient cerebral ischemic attack, unspecified: Secondary | ICD-10-CM

## 2019-10-05 ENCOUNTER — Telehealth: Payer: Self-pay

## 2019-10-05 NOTE — Telephone Encounter (Signed)
I called pt, spoke with pt's daughter Velna Hatchet, per [facility]. I advised her that pt's EEG was normal. They will call us back to schedule a 3 month follow up with Shanda Bumps, NP. Pt's daughter verbalized understanding of results. Pt's had no further questions at this time but was encouraged to call back if questions arise.

## 2019-10-05 NOTE — Telephone Encounter (Signed)
-----   Message from Micki Riley, MD sent at 10/05/2019  8:37 AM EDT ----- Priscilla Tran inform the patient that EEG study was normal

## 2019-10-05 NOTE — Progress Notes (Signed)
Kindly inform the patient that EEG study was normal

## 2019-11-12 ENCOUNTER — Ambulatory Visit: Payer: PRIVATE HEALTH INSURANCE

## 2019-11-26 ENCOUNTER — Ambulatory Visit: Payer: PRIVATE HEALTH INSURANCE | Attending: Internal Medicine

## 2019-11-26 ENCOUNTER — Ambulatory Visit: Payer: PRIVATE HEALTH INSURANCE

## 2019-11-26 DIAGNOSIS — Z23 Encounter for immunization: Secondary | ICD-10-CM

## 2019-11-26 NOTE — Progress Notes (Signed)
   Covid-19 Vaccination Clinic  Name:  Priscilla Tran    MRN: [id number] DOB: 12-27-1935  11/26/2019  [name] was observed post Covid-19 immunization for 15 minutes without incident. She was provided with Vaccine Information Sheet and instruction to access the V-Safe system.   [name] was instructed to call [phone] with any severe reactions post vaccine: Marland Kitchen Difficulty breathing  . Swelling of face and throat  . A fast heartbeat  . A bad rash all over body  . Dizziness and weakness

## 2021-02-26 IMAGING — MR MR HEAD W/O CM
12 of 13 series · 44 of 48 positions shown · non-contrast
Comparison: MRI head 07/19/2019, 09/15/2017

CLINICAL DATA: TIA.

EXAM:
MRI HEAD WITHOUT CONTRAST
TECHNIQUE: Multiplanar, multiecho pulse sequences of the brain and surrounding
structures were obtained without intravenous contrast.

[Series 5: DWI · axial · 3.0mm · 0.88mm/px · z∈[-154,-19]mm · 8 of 96 slices shown (1 of 4)]
[im 1/96]
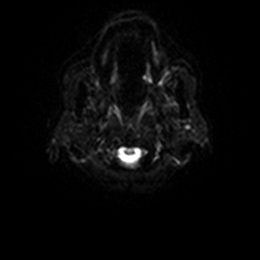
[im 14/96]
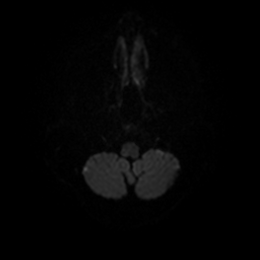
[im 28/96]
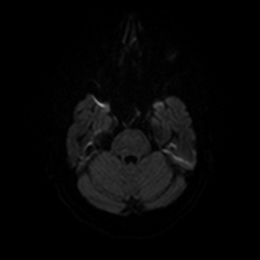
[im 41/96]
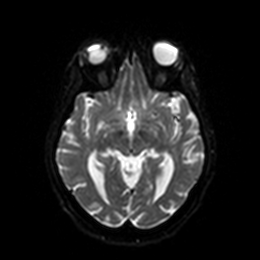
[im 55/96]
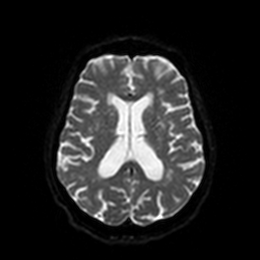
[im 68/96]
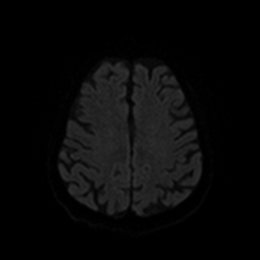
[im 82/96]
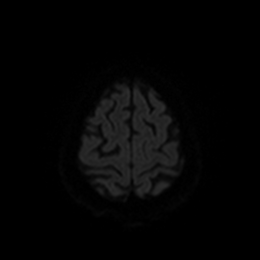
[im 96/96]
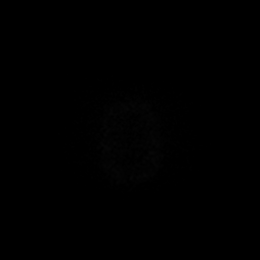

[Series 6: DWI · axial · 3.0mm · 0.88mm/px · z∈[-154,-19]mm · 4 of 48 slices shown (2 of 4)]
[im 1/48]
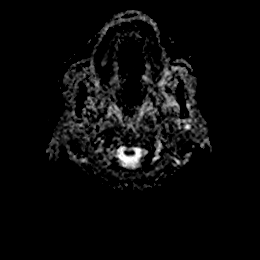
[im 16/48]
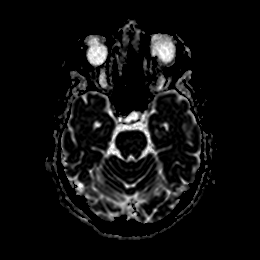
[im 32/48]
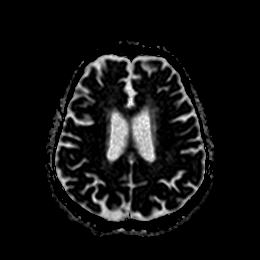
[im 48/48]
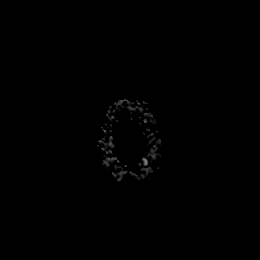

[Series 7: DWI · coronal · 4.0mm · 0.88mm/px · 5 of 66 slices shown (3 of 4)]
[im 1/66]
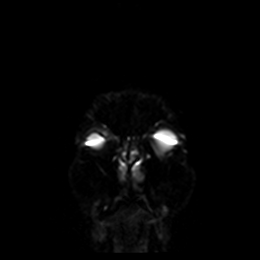
[im 17/66]
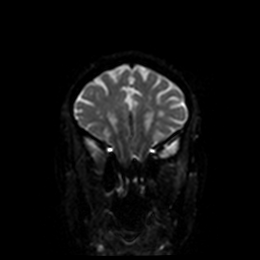
[im 33/66]
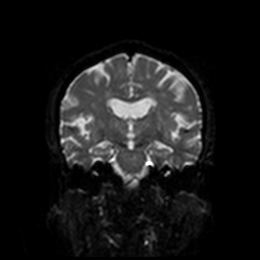
[im 49/66]
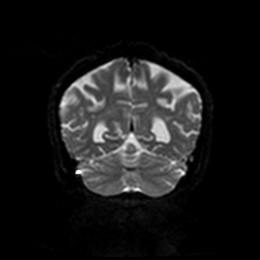
[im 66/66]
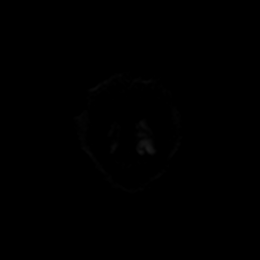

[Series 8: DWI · coronal · 4.0mm · 0.88mm/px · 3 of 33 slices shown (4 of 4)]
[im 1/33]
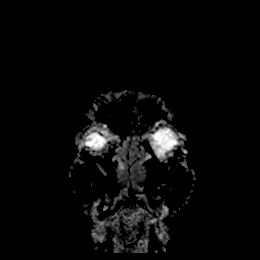
[im 17/33]
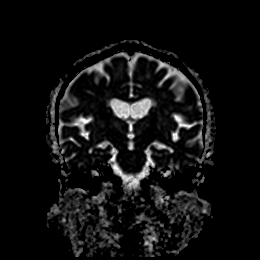
[im 33/33]
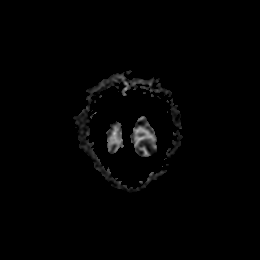

[Series 9: T1 · sagittal · 5.0mm · 0.75mm/px · 2 of 23 slices shown]
[im 1/23]
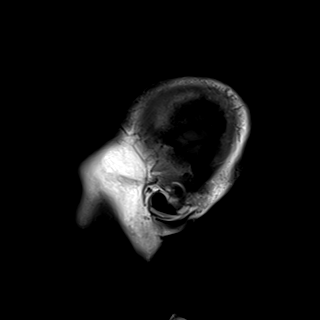
[im 23/23]
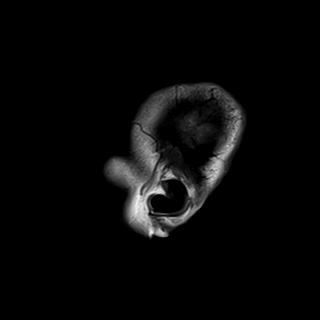

[Series 10: T2 · axial · 5.0mm · 0.72mm/px · z∈[-152,-13]mm · 2 of 25 slices shown (1 of 2)]
[im 1/25]
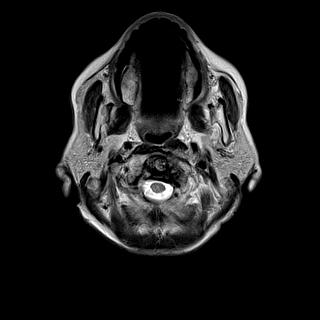
[im 25/25]
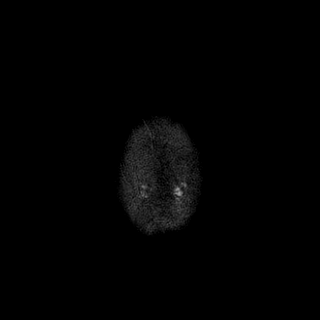

[Series 11: FLAIR · axial · 5.0mm · 0.45mm/px · z∈[-150,-11]mm · 2 of 25 slices shown]
[im 1/25]
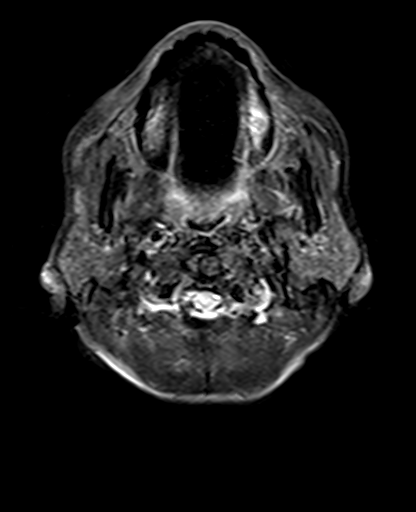
[im 25/25]
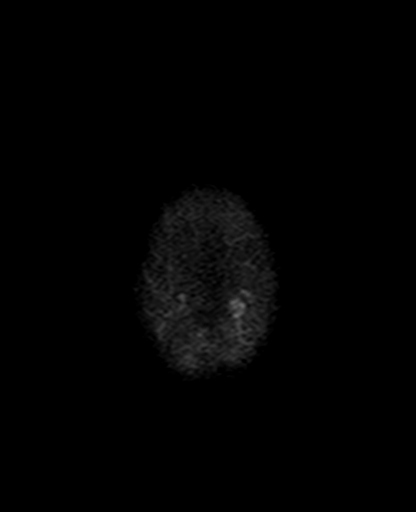

[Series 12: mag_images · axial · 3.0mm · 0.90mm/px · z∈[-160,-13]mm · 4 of 52 slices shown]
[im 1/52]
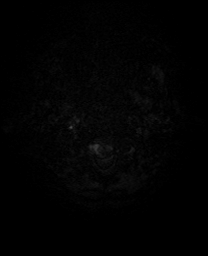
[im 18/52]
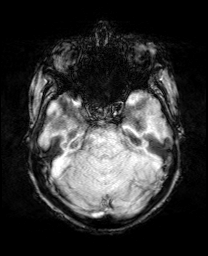
[im 35/52]
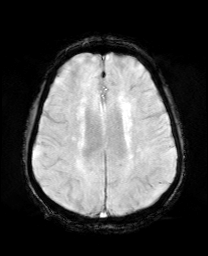
[im 52/52]
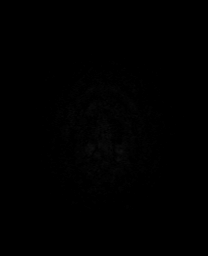

[Series 13: pha_images · axial · 3.0mm · 0.90mm/px · z∈[-157,-13]mm · 4 of 51 slices shown]
[im 1/51]
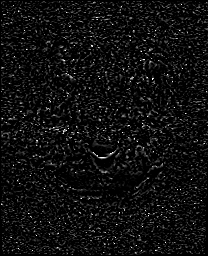
[im 17/51]
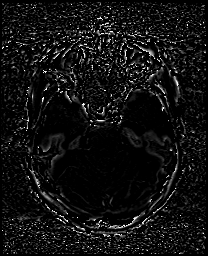
[im 34/51]
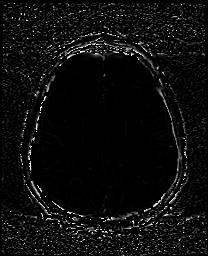
[im 51/51]
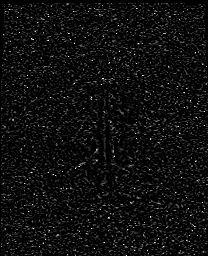

[Series 14: swi_images · axial · 3.0mm · 0.90mm/px · z∈[-160,-13]mm · 4 of 52 slices shown]
[im 1/52]
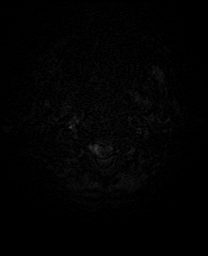
[im 18/52]
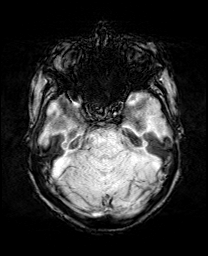
[im 35/52]
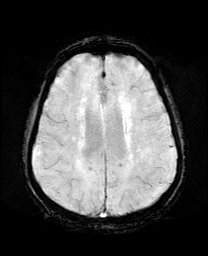
[im 52/52]
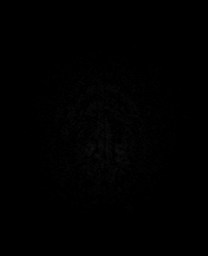

[Series 15: mip_images(sw) · axial · 24.0mm · 0.90mm/px · z∈[-150,-23]mm · 4 of 45 slices shown]
[im 1/45]
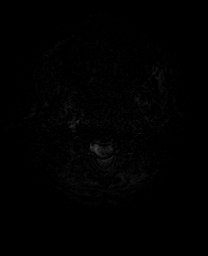
[im 15/45]
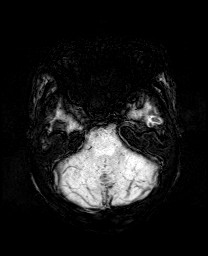
[im 30/45]
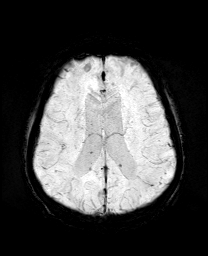
[im 45/45]
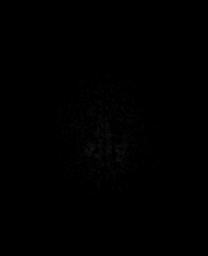

[Series 17: T2 · coronal · 5.0mm · 0.34mm/px · 2 of 28 slices shown (2 of 2)]
[im 1/28]
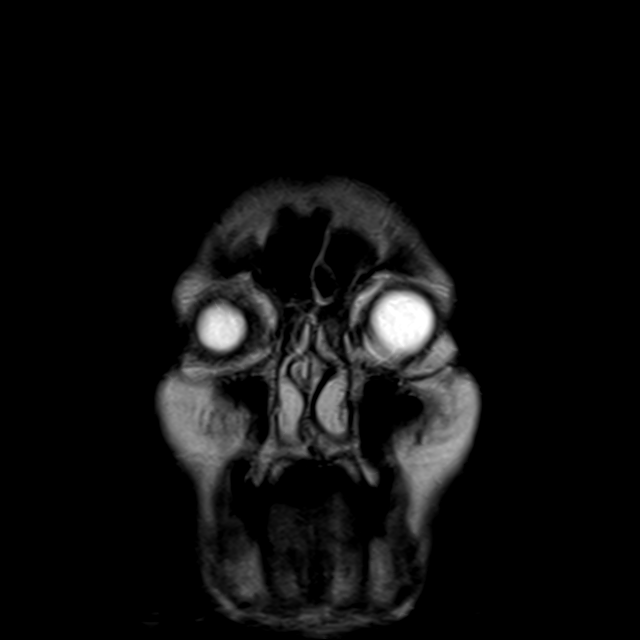
[im 28/28]
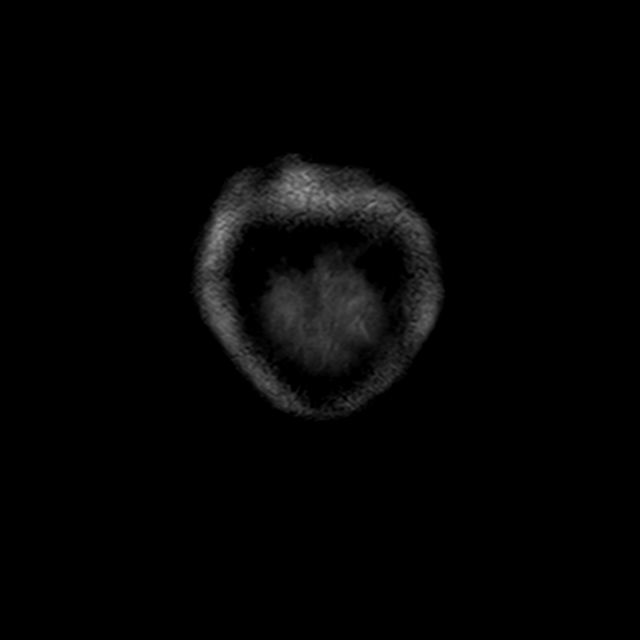

[44 of 48 positions shown; findings below may reference images not displayed]

FINDINGS: Brain: Negative for acute infarct. Moderate chronic microvascular
ischemic change in the white matter and pons bilaterally. Mild
chronic ischemic change in the thalamus on the left. Negative for
mass lesion

Mild susceptibility in the subarachnoid space bilaterally involving
the region the sylvian fissure and over the convexity. This was also
present in 2873 and is compatible with chronic subarachnoid
hemorrhage and siderosis.

Vascular: Normal arterial flow voids.

Skull and upper cervical spine: No focal skeletal lesion. Cervical
spondylosis and ACDF C3-4 and C4-5.

Sinuses/Orbits: Mild mucosal edema paranasal sinuses. Bilateral
ocular surgery.

Other: None
IMPRESSION: Negative for acute infarct and no change from the recent study.

Moderate chronic microvascular ischemic change. Mild superficial
siderosis due to prior subarachnoid hemorrhage. This was also
present in 2873.

## 2021-09-09 ENCOUNTER — Emergency Department (HOSPITAL_COMMUNITY): Payer: Medicare HMO

## 2021-09-09 ENCOUNTER — Emergency Department (HOSPITAL_COMMUNITY)
Admission: EM | Admit: 2021-09-09 | Discharge: 2021-09-09 | Disposition: A | Discharge status: Home / Self Care | Payer: Medicare HMO | Attending: Emergency Medicine | Admitting: Emergency Medicine

## 2021-09-09 ENCOUNTER — Encounter (HOSPITAL_COMMUNITY): Payer: Self-pay

## 2021-09-09 ENCOUNTER — Other Ambulatory Visit: Payer: Self-pay

## 2021-09-09 DIAGNOSIS — R202 Paresthesia of skin: Secondary | ICD-10-CM | POA: Diagnosis not present

## 2021-09-09 DIAGNOSIS — Z7902 Long term (current) use of antithrombotics/antiplatelets: Secondary | ICD-10-CM | POA: Insufficient documentation

## 2021-09-09 DIAGNOSIS — Z7984 Long term (current) use of oral hypoglycemic drugs: Secondary | ICD-10-CM | POA: Diagnosis not present

## 2021-09-09 DIAGNOSIS — R200 Anesthesia of skin: Secondary | ICD-10-CM | POA: Diagnosis present

## 2021-09-09 LAB — COMPREHENSIVE METABOLIC PANEL
ALT: 11 U/L (ref 0–44)
AST: 20 U/L (ref 15–41)
Albumin: 4 g/dL (ref 3.5–5.0)
Alkaline Phosphatase: 67 U/L (ref 38–126)
Anion gap: 8 (ref 5–15)
BUN: 7 mg/dL — ABNORMAL LOW (ref 8–23)
CO2: 25 mmol/L (ref 22–32)
Calcium: 9.9 mg/dL (ref 8.9–10.3)
Chloride: 104 mmol/L (ref 98–111)
Creatinine, Ser: 0.61 mg/dL (ref 0.44–1.00)
GFR, Estimated: >60 mL/min (ref >60)
Glucose, Bld: 127 mg/dL — ABNORMAL HIGH (ref 70–99)
Potassium: 4.1 mmol/L (ref 3.5–5.1)
Sodium: 137 mmol/L (ref 135–145)
Total Bilirubin: 0.9 mg/dL (ref 0.3–1.2)
Total Protein: 6.7 g/dL (ref 6.5–8.1)

## 2021-09-09 LAB — CBC WITH DIFFERENTIAL/PLATELET
Abs Immature Granulocytes: 0.02 10*3/uL (ref 0.00–0.07)
Basophils Absolute: 0 10*3/uL (ref 0.0–0.1)
Basophils Relative: 1 %
Eosinophils Absolute: 0.2 10*3/uL (ref 0.0–0.5)
Eosinophils Relative: 3 %
HCT: 37.5 % (ref 36.0–46.0)
Hemoglobin: 12.3 g/dL (ref 12.0–15.0)
Immature Granulocytes: 0 %
Lymphocytes Relative: 25 %
Lymphs Abs: 1.5 10*3/uL (ref 0.7–4.0)
MCH: 27 pg (ref 26.0–34.0)
MCHC: 32.8 g/dL (ref 30.0–36.0)
MCV: 82.4 fL (ref 80.0–100.0)
Monocytes Absolute: 0.6 10*3/uL (ref 0.1–1.0)
Monocytes Relative: 10 %
Neutro Abs: 3.7 10*3/uL (ref 1.7–7.7)
Neutrophils Relative %: 61 %
Platelets: 222 10*3/uL (ref 150–400)
RBC: 4.55 MIL/uL (ref 3.87–5.11)
RDW: 16.2 % — ABNORMAL HIGH (ref 11.5–15.5)
WBC: 5.9 10*3/uL (ref 4.0–10.5)
nRBC: 0 % (ref 0.0–0.2)

## 2021-09-09 LAB — CBG MONITORING, ED: Glucose-Capillary: 108 mg/dL — ABNORMAL HIGH (ref 70–99)

## 2021-09-09 LAB — TROPONIN I (HIGH SENSITIVITY): Troponin I (High Sensitivity): 5 ng/L (ref <18)

## 2021-09-09 NOTE — ED Provider Triage Note (Signed)
Emergency Medicine Provider Triage Evaluation Note  Priscilla Tran , a 86 y.o. female  was evaluated in triage.  Pt complains of numbness to her right hand.  She states that this has been ongoing for about 1 week.  She states that particularly happens at night.  She states that the numbness goes proximally past her elbow.  She denies headache, visual changes (blind in the right eye but no changes to the left), dizziness, nausea, chest pain or shortness of breath.  She has no sensation change to the face or leg.  Does have a history of TIA and is on Plavix.  Review of Systems  Positive:  Negative:  Physical Exam  BP (!) 114/51 (BP Location: Left Arm)   Pulse (!) 57   Temp 98.6 F (37 C) (Oral)   Resp 16   SpO2 99%  Gen:   Awake, no distress   Resp:  Normal effort  MSK:   Moves extremities without difficulty  Other:  Nonfocal neurological exam.  CN II through XII intact, no pronator drift, sensation intact, no weakness.  Blind in the right eye, vision grossly intact in the left  Medical Decision Making  Medically screening exam initiated at 9:23 AM.  Appropriate orders placed.  Priscilla Tran was informed that the remainder of the evaluation will be completed by another provider, this initial triage assessment does not replace that evaluation, and the importance of remaining in the ED until their evaluation is complete.     Cristopher Peru, PA-C 09/09/21 [phone]

## 2021-09-09 NOTE — ED Provider Notes (Signed)
[facility] EMERGENCY DEPARTMENT Provider Note   CSN: [id number] Arrival date & time: 09/09/21  6269     History  Chief Complaint  Patient presents with   Arm Pain    Priscilla Tran is a 86 y.o. female.  86 year old female brought in by daughter with concern for numbness in the right hand.  Patient states this has been ongoing for the past week, only occurs at night and wakes her from her sleep with pins-and-needles starting in her thumb and eventually spreading across her whole hand and upper arm.  She denies falls or injuries, states that her symptoms improve if she sits up, extends her arm and palm surface to few times.  Patient is right-hand dominant, states that she does drop things with her right hand at times.       Home Medications Prior to Admission medications   Medication Sig Start Date End Date Taking? Authorizing Provider  acetaminophen (TYLENOL) 325 MG tablet Take 2 tablets (650 mg total) by mouth every 4 (four) hours as needed for mild pain (or temp > 37.5 C (99.5 F)). 09/23/17   Emokpae, Courage, MD  amLODipine (NORVASC) 10 MG tablet Take 1 tablet (10 mg total) by mouth daily. 09/23/17   Shon Hale, MD  atorvastatin (LIPITOR) 40 MG tablet Take 1 tablet (40 mg total) by mouth daily. 07/20/19   Darlin Drop, DO  brimonidine (ALPHAGAN) 0.2 % ophthalmic solution Place 1 drop into the left eye in the morning and at bedtime. 07/12/19   [provider]  carvedilol (COREG) 12.5 MG tablet Take 1 tablet (12.5 mg total) by mouth 2 (two) times daily with a meal. 09/23/17   Emokpae, Courage, MD  clopidogrel (PLAVIX) 75 MG tablet TAKE 1 TABLET BY MOUTH DAILY (HOLD UNTIL 10/08/17) Patient taking differently: Take 75 mg by mouth daily.  12/21/18   Ihor Austin, NP  furosemide (LASIX) 20 MG tablet Take by mouth. 08/17/19 08/27/19  [provider]  levothyroxine (SYNTHROID, LEVOTHROID) 150 MCG tablet Take 1 tablet (150 mcg total) by mouth  daily. Patient taking differently: Take 100 mcg by mouth daily.  09/23/17   Shon Hale, MD  metFORMIN (GLUCOPHAGE) 1000 MG tablet Take 1 tablet (1,000 mg total) by mouth 2 (two) times daily with a meal. 09/23/17   Mariea Clonts, Courage, MD  potassium chloride (KLOR-CON) 10 MEQ tablet Take by mouth. 08/17/19   [provider]  prednisoLONE acetate (PRED FORTE) 1 % ophthalmic suspension Place 1 drop into the left eye 3 (three) times daily.     [provider]      Allergies    Lisinopril    Review of Systems   Review of Systems Negative except as per HPI Physical Exam Updated Vital Signs BP (!) 164/92   Pulse (!) 53   Temp 98.5 F (36.9 C) (Oral)   Resp 15   Ht 5\' 4"  (1.626 m)   Wt 65.8 kg   SpO2 98%   BMI 24.89 kg/m  Physical Exam Vitals and nursing note reviewed.  Constitutional:      General: She is not in acute distress.    Appearance: She is well-developed. She is not diaphoretic.  HENT:     Head: Normocephalic and atraumatic.  Cardiovascular:     Pulses: Normal pulses.  Pulmonary:     Effort: Pulmonary effort is normal.  Musculoskeletal:        General: No swelling, tenderness, deformity or signs of injury.  Skin:  General: Skin is warm and dry.     Findings: No erythema or rash.  Neurological:     Mental Status: She is alert and oriented to person, place, and time.     Sensory: No sensory deficit.     Motor: No weakness.     Deep Tendon Reflexes: Reflexes normal.  Psychiatric:        Behavior: Behavior normal.     ED Results / Procedures / Treatments   Labs (all labs ordered are listed, but only abnormal results are displayed) Labs Reviewed  COMPREHENSIVE METABOLIC PANEL - Abnormal; Notable for the following components:      Result Value   Glucose, Bld 127 (*)    BUN 7 (*)    All other components within normal limits  CBC WITH DIFFERENTIAL/PLATELET - Abnormal; Notable for the following components:   RDW 16.2 (*)    All other  components within normal limits  CBG MONITORING, ED - Abnormal; Notable for the following components:   Glucose-Capillary 108 (*)    All other components within normal limits  TROPONIN I (HIGH SENSITIVITY)  TROPONIN I (HIGH SENSITIVITY)    EKG None  Radiology CT Head Wo Contrast  Result Date: 09/09/2021 CLINICAL DATA:  Transient ischemic attack (TIA) EXAM: CT HEAD WITHOUT CONTRAST TECHNIQUE: Contiguous axial images were obtained from the base of the skull through the vertex without intravenous contrast. RADIATION DOSE REDUCTION: This exam was performed according to the departmental dose-optimization program which includes automated exposure control, adjustment of the mA and/or kV according to patient size and/or use of iterative reconstruction technique. COMPARISON:  CT head July 21, 2019. FINDINGS: Brain: No evidence of acute large vascular territory infarction, hemorrhage, hydrocephalus, extra-axial collection or mass lesion/mass effect. Chronic microvascular ischemic disease and cerebral atrophy. Partially empty sella. Vascular: No hyperdense vessel identified. Skull: No acute fracture. Sinuses/Orbits: No acute findings. Postoperative changes of the globes bilaterally. Clear sinuses. Other: No mastoid effusions. IMPRESSION: No evidence of acute intracranial abnormality. Electronically Signed   By: Margaretha Sheffield M.D.   On: 09/09/2021 10:50    Procedures Procedures    Medications Ordered in ED Medications - No data to display  ED Course/ Medical Decision Making/ A&P                           Medical Decision Making  86 year old female with complaint of tingling in her right hand which wakes her from her sleep.  This only occurs at night, denies any daytime symptoms although states that she is dropping things with his right hand more often than usual for the past week.  Exam is unremarkable, capillary refill symmetric to left hand, radial pulse present, sensation intact, equal upper  extremity strength and reflexes.  Work-up obtained through triage including CT of the head and lab work is unremarkable.  Discussed carpal tunnel versus cubital tunnel with the patient as she has symptoms affecting all 5 of her fingers, suspect cubital tunnel.  Negative Tinel's, unable to perform Phalen's as patient is unable to complete the task due to mobility.  Discussed management at home with plan to follow-up with PCP versus Ortho for further work-up.        Final Clinical Impression(s) / ED Diagnoses Final diagnoses:  Paresthesia of right arm    Rx / DC Orders ED Discharge Orders     None         Tacy Learn, PA-C 09/09/21 1222  Maia Plan, MD 09/11/21 1600

## 2021-09-09 NOTE — ED Triage Notes (Signed)
Pt reports right arm pain and numbness at night for the past week. States it was worse last night. During the day she does not feel any symptoms. Denies headache or dizziness.

## 2021-09-09 NOTE — Discharge Instructions (Addendum)
Follow-up with your primary care provider.  Can also see Ortho, call to schedule an appointment.  Return to the emergency room for worsening or concerning symptoms.

## 2023-07-03 ENCOUNTER — Emergency Department (HOSPITAL_BASED_OUTPATIENT_CLINIC_OR_DEPARTMENT_OTHER): Admitting: Radiology

## 2023-07-03 ENCOUNTER — Emergency Department (HOSPITAL_BASED_OUTPATIENT_CLINIC_OR_DEPARTMENT_OTHER)
Admission: EM | Admit: 2023-07-03 | Discharge: 2023-07-03 | Disposition: A | Discharge status: Home / Self Care | Attending: Emergency Medicine | Admitting: Emergency Medicine

## 2023-07-03 ENCOUNTER — Other Ambulatory Visit: Payer: Self-pay

## 2023-07-03 ENCOUNTER — Encounter (HOSPITAL_BASED_OUTPATIENT_CLINIC_OR_DEPARTMENT_OTHER): Payer: Self-pay

## 2023-07-03 DIAGNOSIS — E039 Hypothyroidism, unspecified: Secondary | ICD-10-CM | POA: Diagnosis not present

## 2023-07-03 DIAGNOSIS — Z79899 Other long term (current) drug therapy: Secondary | ICD-10-CM | POA: Insufficient documentation

## 2023-07-03 DIAGNOSIS — Z8673 Personal history of transient ischemic attack (TIA), and cerebral infarction without residual deficits: Secondary | ICD-10-CM | POA: Diagnosis not present

## 2023-07-03 DIAGNOSIS — E119 Type 2 diabetes mellitus without complications: Secondary | ICD-10-CM | POA: Diagnosis not present

## 2023-07-03 DIAGNOSIS — R079 Chest pain, unspecified: Secondary | ICD-10-CM | POA: Diagnosis present

## 2023-07-03 DIAGNOSIS — Z7984 Long term (current) use of oral hypoglycemic drugs: Secondary | ICD-10-CM | POA: Diagnosis not present

## 2023-07-03 DIAGNOSIS — I10 Essential (primary) hypertension: Secondary | ICD-10-CM | POA: Insufficient documentation

## 2023-07-03 LAB — BASIC METABOLIC PANEL WITH GFR
Anion gap: 13 (ref 5–15)
BUN: 6 mg/dL — ABNORMAL LOW (ref 8–23)
CO2: 26 mmol/L (ref 22–32)
Calcium: 10.4 mg/dL — ABNORMAL HIGH (ref 8.9–10.3)
Chloride: 101 mmol/L (ref 98–111)
Creatinine, Ser: 0.64 mg/dL (ref 0.44–1.00)
GFR, Estimated: >60 mL/min (ref >60)
Glucose, Bld: 150 mg/dL — ABNORMAL HIGH (ref 70–99)
Potassium: 3.9 mmol/L (ref 3.5–5.1)
Sodium: 140 mmol/L (ref 135–145)

## 2023-07-03 LAB — CBC
HCT: 37.8 % (ref 36.0–46.0)
Hemoglobin: 12.4 g/dL (ref 12.0–15.0)
MCH: 27.7 pg (ref 26.0–34.0)
MCHC: 32.8 g/dL (ref 30.0–36.0)
MCV: 84.4 fL (ref 80.0–100.0)
Platelets: 235 10*3/uL (ref 150–400)
RBC: 4.48 MIL/uL (ref 3.87–5.11)
RDW: 16.1 % — ABNORMAL HIGH (ref 11.5–15.5)
WBC: 6 10*3/uL (ref 4.0–10.5)
nRBC: 0 % (ref 0.0–0.2)

## 2023-07-03 LAB — TROPONIN T, HIGH SENSITIVITY
Troponin T High Sensitivity: <15 ng/L (ref <19)
Troponin T High Sensitivity: <15 ng/L (ref <19)

## 2023-07-03 NOTE — ED Provider Notes (Signed)
 [place] EMERGENCY DEPARTMENT AT [facility] Provider Note   CSN: [id number] Arrival date & time: 07/03/23  9629     History  Chief Complaint  Patient presents with   Chest Pain    Priscilla Tran is a 88 y.o. female.   Chest Pain   88 year old female presents emergency department with complaints of chest pain.  Patient states he woke up around 5 AM this morning with feelings of discomfort in the left side of her chest.  States that symptoms lasted for a little over an hour.  States that she sat up in her bed around 6 due to discomfort and it eventually subsided.  Denied any shortness of breath, radiation of pain, fever, cough, congestion, abdominal pain, nausea, vomiting.  States she is currently without any symptoms.  Try to get appoint with her primary care but was told by the nurse on the phone to come to the emergency department for evaluation.  Past medical history significant for SBO, hypertension, hyperlipidemia, hypothyroidism, TIA, diabetes mellitus type 2  Home Medications Prior to Admission medications   Medication Sig Start Date End Date Taking? Authorizing Provider  acetaminophen  (TYLENOL ) 325 MG tablet Take 2 tablets (650 mg total) by mouth every 4 (four) hours as needed for mild pain (or temp > 37.5 C (99.5 F)). 09/23/17   Emokpae, Courage, MD  amLODipine  (NORVASC ) 10 MG tablet Take 1 tablet (10 mg total) by mouth daily. 09/23/17   Colin Dawley, MD  atorvastatin  (LIPITOR) 40 MG tablet Take 1 tablet (40 mg total) by mouth daily. 07/20/19   Bary Boss, DO  brimonidine  (ALPHAGAN ) 0.2 % ophthalmic solution Place 1 drop into the left eye in the morning and at bedtime. 07/12/19   [provider]  carvedilol  (COREG ) 12.5 MG tablet Take 1 tablet (12.5 mg total) by mouth 2 (two) times daily with a meal. 09/23/17   Emokpae, Courage, MD  clopidogrel  (PLAVIX ) 75 MG tablet TAKE 1 TABLET BY MOUTH DAILY (HOLD UNTIL 10/08/17) Patient taking differently: Take 75 mg  by mouth daily.  12/21/18   Johny Nap, NP  furosemide (LASIX) 20 MG tablet Take by mouth. 08/17/19 08/27/19  [provider]  levothyroxine  (SYNTHROID , LEVOTHROID) 150 MCG tablet Take 1 tablet (150 mcg total) by mouth daily. Patient taking differently: Take 100 mcg by mouth daily.  09/23/17   Colin Dawley, MD  metFORMIN  (GLUCOPHAGE ) 1000 MG tablet Take 1 tablet (1,000 mg total) by mouth 2 (two) times daily with a meal. 09/23/17   Colin Dawley, MD  potassium chloride  (KLOR-CON ) 10 MEQ tablet Take by mouth. 08/17/19   [provider]  prednisoLONE  acetate (PRED FORTE ) 1 % ophthalmic suspension Place 1 drop into the left eye 3 (three) times daily.     [provider]      Allergies    Lisinopril    Review of Systems   Review of Systems  Cardiovascular:  Positive for chest pain.  All other systems reviewed and are negative.   Physical Exam Updated Vital Signs BP (!) 195/85   Pulse 62   Resp 18   Ht 5\' 5"  (1.651 m)   Wt 65.8 kg   SpO2 97%   BMI 24.13 kg/m  Physical Exam Vitals and nursing note reviewed.  Constitutional:      General: She is not in acute distress.    Appearance: She is well-developed.  HENT:     Head: Normocephalic and atraumatic.  Eyes:     Conjunctiva/sclera: Conjunctivae  normal.  Cardiovascular:     Rate and Rhythm: Normal rate and regular rhythm.     Pulses: Normal pulses.  Pulmonary:     Effort: Pulmonary effort is normal. No respiratory distress.     Breath sounds: Normal breath sounds. No wheezing, rhonchi or rales.  Abdominal:     Palpations: Abdomen is soft.     Tenderness: There is no abdominal tenderness.  Musculoskeletal:        General: No swelling.     Cervical back: Neck supple.     Right lower leg: No edema.     Left lower leg: No edema.  Skin:    General: Skin is warm and dry.     Capillary Refill: Capillary refill takes less than 2 seconds.  Neurological:     Mental Status: She is alert.   Psychiatric:        Mood and Affect: Mood normal.    ED Results / Procedures / Treatments   Labs (all labs ordered are listed, but only abnormal results are displayed) Labs Reviewed  BASIC METABOLIC PANEL WITH GFR  CBC  TROPONIN T, HIGH SENSITIVITY    EKG EKG Interpretation Date/Time:  Friday July 03 2023 09:00:59 EDT Ventricular Rate:  60 PR Interval:  175 QRS Duration:  103 QT Interval:  416 QTC Calculation: 416 R Axis:   -36  Text Interpretation: Sinus rhythm Left ventricular hypertrophy Anterior Q waves, possibly due to LVH No significant change since last tracing Confirmed by Zackowski, Scott [phone]) on 07/03/2023 9:05:43 AM  Radiology No results found.  Procedures Procedures    Medications Ordered in ED Medications - No data to display  ED Course/ Medical Decision Making/ A&P                                 Medical Decision Making Amount and/or Complexity of Data Reviewed Labs: ordered. Radiology: ordered.   This patient presents to the ED for concern of chest pain, this involves an extensive number of treatment options, and is a complaint that carries with it a high risk of complications and morbidity.  The differential diagnosis includes ACS, PE, pneumothorax, peritonitis/myocarditis/tamponade, GERD, aortic dissection, pneumonia, pneumothorax, anxiety, other   Co morbidities that complicate the patient evaluation  See HPI   Additional history obtained:  Additional history obtained from EMR External records from outside source obtained and reviewed including hospital records   Lab Tests:  I Ordered, and personally interpreted labs.  The pertinent results include: Initial Trope less than 15 with repeat less than 15.  Mild hypocalcemia of 10.4 otherwise, electrolytes within normal limits.  No renal dysfunction.  No leukocytosis.  No evidence of anemia.  Platelets within normal range.   Imaging Studies ordered:  I ordered imaging studies including  chest x-ray I independently visualized and interpreted imaging which showed no acute cardiopulmonary abnormality I agree with the radiologist interpretation   Cardiac Monitoring: / EKG:  The patient was maintained on a cardiac monitor.  I personally viewed and interpreted the cardiac monitored which showed an underlying rhythm of: Sinus rhythm.  LVH.  No significant acute ischemic change from prior EKG performed.   Consultations Obtained:  I requested consultation with attending Dr. Zackowski who was in agreement with treatment plan going forward   Problem List / ED Course / Critical interventions / Medication management  Chest pain Reevaluation of the patient showed that the patient stayed the same  I have reviewed the patients home medicines and have made adjustments as needed   Social Determinants of Health:  Denies tobacco or illicit drug use.   Test / Admission - Considered:  Chest pain Vitals signs significant for hypertension. Otherwise within normal range and stable throughout visit. Laboratory/imaging studies significant for: See above 88 year old female presents emergency department with complaints of chest pain.  Patient states he woke up around 5 AM this morning with feelings of discomfort in the left side of her chest.  States that symptoms lasted for a little over an hour.  States that she sat up in her bed around 6 due to discomfort and it eventually subsided.  Denied any shortness of breath, radiation of pain, fever, cough, congestion, abdominal pain, nausea, vomiting.  States she is currently without any symptoms.  Try to get appoint with her primary care but was told by the nurse on the phone to come to the emergency department for evaluation. On exam, lungs clear to auscultation.  No chest wall tenderness.  Pulses symmetric and equal.  Workup today reassuring.  Delta negative troponin, lack of acute ischemic change on EKG; low suspicion for ACS.  Patient without  tachycardia, tachypnea, hypoxia, risk factors for PE; low suspicion for PE.  Patient without chest pain rating to back, pulse deficits; low suspicion for aortic dissection.  Chest x-ray without obvious pneumonia, pneumothorax or other acute cardiopulmonary abnormality.  Symptoms more likely related to GERD given that symptoms were present when lying flat and seem to improve with sitting up in bed.  No exertional worsening of symptoms.  Will recommend lifestyle modifications in the outpatient setting and follow-up with primary care for reassessment.  Treatment plan discussed with patient and family member and they acknowledge understanding were agreeable to set plan.  Patient overall well-appearing, afebrile in no acute distress. Worrisome signs and symptoms were discussed with the patient, and the patient acknowledged understanding to return to the ED if noticed. Patient was stable upon discharge.          Final Clinical Impression(s) / ED Diagnoses Final diagnoses:  None    Rx / DC Orders ED Discharge Orders     None         [facility] Butter, [place] 07/03/23 1143    Nicklas Barns, MD 07/04/23 [phone]

## 2023-07-03 NOTE — Discharge Instructions (Signed)
 As discussed, your workup today was reassuring.  Your heart enzymes were normal and your EKG looks very similar from a prior EKG that we have.  It does not seem like you are having a heart attack.  I suspect your symptoms are more likely due to GERD or heartburn.  See information attached your discharge papers regarding lifestyle changes.  If you continue to be symptomatic after lifestyle changes, recommend using pepcid or maalox which you can find over-the-counter.  Recommend follow-up with primary care for reassessment.  Please do not hesitate to return if the worrisome signs and symptoms we discussed become apparent.

## 2023-07-03 NOTE — ED Triage Notes (Signed)
 Pt POV from home c/o chest pain since approx 5am after getting up to use the restroom. Pain located upper left chest, intermittent. No SOB, NVD. NAD during triage.
